# Patient Record
Sex: Female | Born: 1960 | Race: White | Hispanic: No | Marital: Married | Smoking: Current every day smoker
Health system: Southern US, Community
[De-identification: ages and names within clinical notes are randomized; demographics above are authoritative.]

## PROBLEM LIST (undated history)

## (undated) DIAGNOSIS — F329 Major depressive disorder, single episode, unspecified: Secondary | ICD-10-CM

## (undated) DIAGNOSIS — F32A Depression, unspecified: Secondary | ICD-10-CM

## (undated) DIAGNOSIS — G43909 Migraine, unspecified, not intractable, without status migrainosus: Secondary | ICD-10-CM

## (undated) DIAGNOSIS — D649 Anemia, unspecified: Secondary | ICD-10-CM

## (undated) DIAGNOSIS — C801 Malignant (primary) neoplasm, unspecified: Secondary | ICD-10-CM

## (undated) DIAGNOSIS — I219 Acute myocardial infarction, unspecified: Secondary | ICD-10-CM

## (undated) DIAGNOSIS — M8580 Other specified disorders of bone density and structure, unspecified site: Secondary | ICD-10-CM

## (undated) DIAGNOSIS — K219 Gastro-esophageal reflux disease without esophagitis: Secondary | ICD-10-CM

## (undated) DIAGNOSIS — T7840XA Allergy, unspecified, initial encounter: Secondary | ICD-10-CM

## (undated) DIAGNOSIS — M797 Fibromyalgia: Secondary | ICD-10-CM

## (undated) DIAGNOSIS — F419 Anxiety disorder, unspecified: Secondary | ICD-10-CM

## (undated) DIAGNOSIS — K5190 Ulcerative colitis, unspecified, without complications: Secondary | ICD-10-CM

## (undated) HISTORY — DX: Anxiety disorder, unspecified: F41.9

## (undated) HISTORY — PX: OTHER SURGICAL HISTORY: SHX169

## (undated) HISTORY — DX: Depression, unspecified: F32.A

## (undated) HISTORY — PX: LAPAROSCOPY: SHX197

## (undated) HISTORY — DX: Malignant (primary) neoplasm, unspecified: C80.1

## (undated) HISTORY — DX: Fibromyalgia: M79.7

## (undated) HISTORY — DX: Major depressive disorder, single episode, unspecified: F32.9

## (undated) HISTORY — DX: Gastro-esophageal reflux disease without esophagitis: K21.9

## (undated) HISTORY — DX: Allergy, unspecified, initial encounter: T78.40XA

## (undated) HISTORY — DX: Anemia, unspecified: D64.9

## (undated) HISTORY — PX: TUBAL LIGATION: SHX77

## (undated) HISTORY — DX: Other specified disorders of bone density and structure, unspecified site: M85.80

## (undated) HISTORY — DX: Migraine, unspecified, not intractable, without status migrainosus: G43.909

## (undated) HISTORY — PX: LEFT OOPHORECTOMY: SHX1961

---
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
## 2000-07-16 ENCOUNTER — Ambulatory Visit (HOSPITAL_COMMUNITY): Admission: RE | Admit: 2000-07-16 | Discharge: 2000-07-16 | Payer: Self-pay | Admitting: Internal Medicine

## 2000-12-23 ENCOUNTER — Other Ambulatory Visit: Admission: RE | Admit: 2000-12-23 | Discharge: 2000-12-23 | Payer: Self-pay | Admitting: Obstetrics and Gynecology

## 2000-12-29 ENCOUNTER — Encounter: Admission: RE | Admit: 2000-12-29 | Discharge: 2000-12-29 | Payer: Self-pay | Admitting: Obstetrics and Gynecology

## 2000-12-29 ENCOUNTER — Encounter: Payer: Self-pay | Admitting: Obstetrics and Gynecology

## 2001-02-18 ENCOUNTER — Encounter: Admission: RE | Admit: 2001-02-18 | Discharge: 2001-02-18 | Payer: Self-pay | Admitting: Obstetrics and Gynecology

## 2001-02-18 ENCOUNTER — Encounter: Payer: Self-pay | Admitting: Obstetrics and Gynecology

## 2001-04-23 ENCOUNTER — Emergency Department (HOSPITAL_COMMUNITY): Admission: EM | Admit: 2001-04-23 | Discharge: 2001-04-24 | Payer: Self-pay | Admitting: Emergency Medicine

## 2002-01-26 ENCOUNTER — Other Ambulatory Visit: Admission: RE | Admit: 2002-01-26 | Discharge: 2002-01-26 | Payer: Self-pay | Admitting: *Deleted

## 2003-01-31 ENCOUNTER — Other Ambulatory Visit: Admission: RE | Admit: 2003-01-31 | Discharge: 2003-01-31 | Payer: Self-pay | Admitting: *Deleted

## 2003-09-15 ENCOUNTER — Encounter: Admission: RE | Admit: 2003-09-15 | Discharge: 2003-09-15 | Payer: Self-pay | Admitting: *Deleted

## 2005-11-11 ENCOUNTER — Ambulatory Visit (HOSPITAL_BASED_OUTPATIENT_CLINIC_OR_DEPARTMENT_OTHER): Admission: RE | Admit: 2005-11-11 | Discharge: 2005-11-11 | Payer: Self-pay | Admitting: Orthopedic Surgery

## 2006-01-22 ENCOUNTER — Other Ambulatory Visit: Admission: RE | Admit: 2006-01-22 | Discharge: 2006-01-22 | Payer: Self-pay | Admitting: Obstetrics and Gynecology

## 2006-05-05 ENCOUNTER — Ambulatory Visit (HOSPITAL_BASED_OUTPATIENT_CLINIC_OR_DEPARTMENT_OTHER): Admission: RE | Admit: 2006-05-05 | Discharge: 2006-05-05 | Payer: Self-pay | Admitting: Obstetrics & Gynecology

## 2006-05-05 ENCOUNTER — Encounter (INDEPENDENT_AMBULATORY_CARE_PROVIDER_SITE_OTHER): Payer: Self-pay | Admitting: *Deleted

## 2007-03-19 ENCOUNTER — Encounter: Admission: RE | Admit: 2007-03-19 | Discharge: 2007-03-19 | Payer: Self-pay | Admitting: Internal Medicine

## 2007-06-24 ENCOUNTER — Other Ambulatory Visit: Admission: RE | Admit: 2007-06-24 | Discharge: 2007-06-24 | Payer: Self-pay | Admitting: Obstetrics & Gynecology

## 2008-03-30 ENCOUNTER — Encounter: Admission: RE | Admit: 2008-03-30 | Discharge: 2008-03-30 | Payer: Self-pay | Admitting: Obstetrics & Gynecology

## 2011-03-21 NOTE — Op Note (Signed)
Kathy Stephens, Kathy Stephens                ACCOUNT NO.:  [id number]   MEDICAL RECORD NO.:  [id number]          PATIENT TYPE:  AMB   LOCATION:  [facility]SC                          FACILITY:  [facility]   PHYSICIAN:  Katy Fitch. Sypher, M.D. DATE OF BIRTH:  18-Mar-1961   DATE OF PROCEDURE:  11/11/2005  DATE OF DISCHARGE:                                 OPERATIVE REPORT   PREOPERATIVE DIAGNOSES:  1.  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  2.  Interphalangeal pain, rule out degenerative arthritis and mucous cyst      formation.   POSTOPERATIVE DIAGNOSIS:  1  Right thumb stenosing tenosynovitis at A1  pulley.  1.  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  .   OPERATION:  1.  Release of right thumb A1 pulley,  2.  Injection of right thumb interphalangeal joint with Depo-Medrol 40 mg      per mL and lidocaine 1 cc plain lidocaine.   OPERATING SURGEON:  Katy Fitch. Sypher, M.D.   ASSISTANT:  Kathy Stephens P.A.-C .   ANESTHESIA:  Monitored anesthesia care with block of right thumb with 0.25%  Marcaine and 2% lidocaine at metacarpal head level supplemented by IV  sedation.   SUPERVISING ANESTHESIOLOGIST:  Janetta Hora. Gelene Mink, M.D.   INDICATIONS:  Kathy Stephens is a 50 year old woman self-referred for a  locking right thumb.  I have been acquainted with Kathy Stephens for  approximately 18 years as in the past we performed a replantation of her  son's long finger.   She presented for evaluation and management of a locking right thumb with  pain at the MP joint overlying the flexor sheath and triggering of her IP  joint.  In addition, she noted a painful bump on the dorsal radial aspect of  her IP joint consistent with an osteophyte at the base of the distal phalanx  and a kissing osteophyte on the adjacent surface of the proximal phalanx.   She thought she might of the cyst in this region.   Careful examination the holding area suggested  that this was simply an  osteophyte.   She requested that we either excise the osteophyte or otherwise intervene.  I suggested a steroid injection into the IP joint to try to relieve her  pain.   After informed consent, she is brought to the operating room at this time.   DESCRIPTION OF PROCEDURE:  Kathy Stephens is brought to the operating room and  placed in the supine position on the table.   Following light sedation, the right arm was prepped with Betadine soap and  solution and sterilely draped.   Following exsanguination of the right arm with an Esmarch bandage, an  arterial tourniquet on proximal brachium was inflated to 220 mmHg.   The procedure commenced with infiltration of 0.25% Marcaine and 2% lidocaine  into the path of the intended incision.  After a few moments. anesthesia was  satisfactory.   The procedure commenced with a short transverse incision directly over the  palpably thickened A1 pulley.  The subcutaneous tissues were carefully  divided revealing the flexor sheath.  The radial proper digital nerve was  gently retracted.   The A1 pulley was isolated and the sheath split with scalpel and scissors.  The tendon thereafter had a full excursion with recovery of full motion at  the IP joint.   The IP joint was then carefully palpated.  No mucous cyst was identified.   A mixture of 50% Depo-Medrol 40 mg/mL and 1% plain lidocaine was then  injected, a total volume of approximately 0.6 into the IP joint capsule from  a dorsal approach.   The wound for the release of the A1 pulley was repaired with mattress suture  of 5-0 nylon.  The IP joint injection was painted with Betadine.  There were  no apparent complications.   Kathy Stephens was placed in a compressive dressing with sterile gauze and Ace  wrap. She will return to our office for followup in one week at which time  we will advise the range of motion exercise program.      Katy Fitch. Sypher, M.D.   Electronically Signed     RVS/MEDQ  D:  11/11/2005  T:  11/12/2005  Job:  [id number]

## 2011-03-21 NOTE — Op Note (Signed)
NAME:  Kathy Stephens, Kathy Stephens                ACCOUNT NO.:  [id number]   MEDICAL RECORD NO.:  [id number]          PATIENT TYPE:  AMB   LOCATION:  [facility]                         FACILITY:  [facility]   PHYSICIAN:  M. Leda Quail, MD  DATE OF BIRTH:  12-21-1960   DATE OF PROCEDURE:  DATE OF DISCHARGE:                                 OPERATIVE REPORT   PREOPERATIVE DIAGNOSES:  61.  50 year old married white female with complex left ovarian cyst with      excrescence present.  2.  Chronic pelvic pain.  3.  History of cesarean section and bilateral tubal ligation.   POSTOPERATIVE DIAGNOSES:  31.  50 year old married white female with complex left ovarian cyst with      excrescence present.  2.  Chronic pelvic pain.  3.  History of cesarean section and bilateral tubal ligation.   PROCEDURE:  Laparoscopic left salpingo-oophorectomy.   SURGEON:  M. Leda Quail, M.D.   ASSISTANT:  Edwena Felty. Romine, M.D.   ANESTHESIA:  General endotracheal anesthesia.   SPECIMENS:  Left ovary and tube.   ESTIMATED BLOOD LOSS:  Minimal.   URINE OUTPUT:  30 cc of clear with concentrated urine in the Foley catheter.   FLUIDS:  1400 cc of LR.   COMPLICATIONS:  None.   INDICATIONS FOR PROCEDURE:  Kathy Stephens is a very pleasant 45 year old married  white female who has a history of chronic left lower quadrant pain.  This  has been evaluated with an ultrasound showing a complex left ovarian cyst,  and excrescence was present on the cyst.  Ultrasound in four weeks showed  poor resolution of the cyst.  We discussed the treatment options which would  include further conservative management or surgical excision which the  patient opted for.  She has been consented in the clinic as well as here in  the hospital this morning, and the risks and benefits have been discussed  with the patient.   DESCRIPTION OF PROCEDURE:  The patient was taken to the operating room with  running IV in place.  Informed consent was present  on the chart.  She was  placed in the supine position.  General endotracheal anesthesia was  administered by the anesthesia staff without difficulty.  The legs were then  positioned in the Cornell stirrups in the low lithotomy position.  The  umbilicus, abdomen, perineum, inner thighs, and vagina were prepped in the  normal sterile fashion.  A bivalve speculum was placed in the vagina, and  the anterior lip of the cervix was grasped with a single-tooth tenaculum.  An acorn uterine manipulator was inserted through the cervical os and  attached to the tenaculum as a means of manipulating the uterus during the  procedure.  A Foley catheter was then inserted under sterile conditions.  The abdomen and perineum were then prepped and both draped in the normal  sterile fashion.   Sterile gloves were changed, and attention was turned to the abdomen.  The  old incision at the inferior aspect of the umbilicus was identified.  Allis  clamps were applied to either  side of this incision.  Using a #11 blade, a  10-mm incision was made through the same incision.  A hemostat was used to  dissect some of the subcutaneous fat and tissue.  Then a short Veress needle  was obtained.  The patient's abdominal wall was elevated, and the Veress  needle was aimed towards the pelvis.  She did have some scar tissue through  her midline C-section incision.  There was some difficulty identifying when  the peritoneal layer was traversed.  At first, the operator thought the  peritoneum was traversed with the Veress needle, and sterile saline was  injected, and then without difficulty, an aspiration was performed.  No  blood or other fluid was present.  A drip test was then performed, and there  was not any drip from the Veress needle.  I did not think that the Veress  needle was intraperitoneally at this point; however, gas was attached at low  flow, and there were high pressures noted.  A second attempt at placing the   Veress needle was performed.  An attempt was made to place the Veress needle  through the exact same location as before because of the scar from the C-  section.  This time, the peritoneum could easily be identified as it was  popped through with the Veress needle.  The Veress needle, again, was aimed  towards the pelvis, and the abdominal wall area was lifted by the operator's  assistant.  At this point, aspiration was performed with the Veress needle,  and no fluid or blood was aspirated.  Fluid was injected without difficulty,  and the drip test was performed.  The fluid dripped into the Veress needle  without difficulty.  CO2 gas was attached to the Veress needle on low flow,  and there were low pressures under 5 mmHg.  Pneumoperitoneum was achieved  without difficulty.  Once 2.5 L of CO2 was in the abdomen, the Veress needle  was removed.  A long bladed trocar and port were then used for direct entry  placement.  The abdominal wall layer again was elevated, and the #11 trocar  and port were aimed towards the pelvis.  Once the peritoneum was traversed,  the blade was removed and the laparoscope was used to confirm  intraperitoneal placement.  CO2 gas was then attached to the port on high  flow to maintain pneumoperitoneum throughout the procedure.  At this point,  the uterus was elevated.  The right ovary could be visualized; however, the  left ovary could not.  Decision was made to go ahead and place the ports in  the right and left lower quadrant.  The abdomen was transilluminated, and  the abdominal wall vasculature was noted.  Placement for the inferior ports  was identified, and 5-mm skin incisions were made on either side.  Using  visualization from the laparoscope, the 5-mm bladed trocar and ports were  placed under direct entry in the right and left lower quadrant.  No bleeding was noted from the port sites after entry, and the blades were removed.  Patient was positioned in  Trendelenberg at this point.  A blunt probe was  then used, and the left ovary was flipped up.  There was a cyst that was  noted.  It did appear simple in nature; however, there were no adhesions or  abnormalities otherwise on the left ovary.  At this point, using a Nezhat  suction irrigator, approximately 500 cc of fluid was  instilled in the pelvis  and around the ovary.  The abdomen was agitated, and then this was aspirated  out.  This was sent off for cytologic evaluation.   A survey of the upper abdomen was performed at this point.  The appendix was  noted.  The liver edge appeared smooth.  The gallbladder could not be  visualized.  The stomach edge was visualized, and photo documentation of all  of this was made.  The bladder was visualized, and there was no scar tissue.   There were adhesions of the left colon down almost to the IP ligament on the  left side.  As the patient does have some issues with chronic pelvic pain,  the decision was made to go ahead and divide these adhesions and free the  colon more fully from the left side and the left IP ligament.  The bowel was  grasped with a smooth pickup, and then using an endoscopic scissors, the  adhesions were divided without difficulty.  At this point, good  visualization of the left IP ligament was noted.  The ureter was identified  on the left side.  This was deep in the pelvis and well below the level of  where the dissection would be performed.  At this point, a bipolar cautery  with attached blade made by [employer] was used.  This was placed in the  abdomen.  The IP ligament was grasped close to the ovary.  Cautery was then  performed.  Once the resistance was 0, full cauterization of that segment of  tissue was noted.  Full cauterization was noted via visual indicator, noting  0 resistance.  The IP ligament was cauterized twice in the same location  before the cut was performed.  Three separate cauterizations and then   incisions were made to fully traverse the IP ligament.  At this point, the  utero-ovarian ligament was divided first by cauterization and then incising  the pedicle.  Once the left ovary was freed from the IP ligament and the  utero-ovarian ligament, it was placed in the cul-de-sac.  There was a small  amount of bleeding that was still present on the utero-ovarian ligament next  to the cornu of the uterus.  This was grasped with a bipolar cautery, and  full cauterization of this bleeding pedicle was performed.  Excellent  hemostasis was noted at this time.  The suction irrigator was placed back  intra-abdominally.  The left side was irrigated, and no bleeding was noted.  Photo documentation was made.   At this point, a 5-mm scope was obtained, and this was placed in the right  lower quadrant port.  An endoscopic grasper was used to grasp the ovary and bring it out of the cul-de-sac.  An endoscopic bag was placed in the  midline.  The ovary was placed in the bag without difficulty.  The bag was  tightened down, and the ovary and bag were brought out through the midline  incision without difficulty.  The ovary was completely intact at this point.  This was handed off to be sent to pathology.   The midline trocar and port were placed back in the infraumbilical incision,  and then the trocar was removed and 10-mm laparoscope was placed back in the  infraumbilical incision.  The pelvis was resurveyed.  The Nezhat suction  irrigator was used again, and there was good cauterization of the left IP  ligament.  As well, there was good cauterization of  the utero-ovarian  ligament on the left.  The irrigant was fully sucked out of the pelvis.  At  this point, excellent hemostasis was noted.  The right left lower quadrant  ports were removed, and there was no bleeding noted.  The infraumbilical  port and laparoscope were then removed without difficulty.   The operator's index finger was placed in the  incision, and the bowel was  not in the incision.  Attempt was made to close the fascia with a 1-0 Vicryl  with figure-of-eight suture.  The fascia was very deep through the patient's  subcutaneous fat, so I am not completely sure that the fascia was closed  completely.  The incisions were then closed with subcuticular stitches of 4-  0 Vicryl Rapide.  The incisions were cleansed.  Benzoin and Steri-Strips  were applied, and 6 cc of 0.5% Marcaine were instilled beneath these three  incisions for local anesthesia.  At this point, the rest of the abdomen was  cleansed.  The instruments from the vagina were removed.  There was no  bleeding noted from the cervix.  The legs were positioned back in a supine  position.   The patient was awakened from anesthesia without difficulty.  The Foley  catheter was removed.  Sponge, lap, needle, and instrument counts were  correct x2.  The patient tolerated the procedure well.  She was taken to the  recovery room in stable condition.      Lum Keas, MD  Electronically Signed     MSM/MEDQ  D:  05/05/2006  T:  05/05/2006  Job:  [id number]

## 2011-05-13 ENCOUNTER — Other Ambulatory Visit: Payer: Self-pay | Admitting: Physician Assistant

## 2011-05-30 ENCOUNTER — Other Ambulatory Visit: Payer: Self-pay | Admitting: Obstetrics & Gynecology

## 2011-05-30 DIAGNOSIS — Z1231 Encounter for screening mammogram for malignant neoplasm of breast: Secondary | ICD-10-CM

## 2011-06-12 ENCOUNTER — Ambulatory Visit
Admission: RE | Admit: 2011-06-12 | Discharge: 2011-06-12 | Disposition: A | Discharge status: Home / Self Care | Payer: 59 | Source: Ambulatory Visit | Attending: Obstetrics & Gynecology | Admitting: Obstetrics & Gynecology

## 2011-06-12 DIAGNOSIS — Z1231 Encounter for screening mammogram for malignant neoplasm of breast: Secondary | ICD-10-CM

## 2011-08-28 ENCOUNTER — Ambulatory Visit (INDEPENDENT_AMBULATORY_CARE_PROVIDER_SITE_OTHER): Payer: Self-pay

## 2011-08-28 DIAGNOSIS — R197 Diarrhea, unspecified: Secondary | ICD-10-CM

## 2011-09-05 ENCOUNTER — Other Ambulatory Visit: Payer: 59 | Admitting: Internal Medicine

## 2011-09-05 ENCOUNTER — Encounter: Payer: Self-pay | Admitting: Internal Medicine

## 2011-09-05 ENCOUNTER — Ambulatory Visit (AMBULATORY_SURGERY_CENTER): Payer: Self-pay | Admitting: Internal Medicine

## 2011-09-05 VITALS — BP 143/98 | HR 70 | Temp 97.8°F | Resp 20 | Ht 63.0 in | Wt 205.0 lb

## 2011-09-05 DIAGNOSIS — Z1211 Encounter for screening for malignant neoplasm of colon: Secondary | ICD-10-CM

## 2011-09-05 DIAGNOSIS — Z006 Encounter for examination for normal comparison and control in clinical research program: Secondary | ICD-10-CM

## 2011-09-05 MED ORDER — SODIUM CHLORIDE 0.9 % IV SOLN: 500.0000 mL | INTRAVENOUS | Status: DC

## 2011-09-05 NOTE — Patient Instructions (Signed)
Normal colonoscopy  Repeat colonoscopy in 10 years  Keep your follow up appointment with research coordinator  Discharge instructions per blue and green sheets

## 2011-09-08 ENCOUNTER — Telehealth: Payer: Self-pay | Admitting: *Deleted

## 2011-09-08 NOTE — Telephone Encounter (Signed)
Follow up Call- Patient questions:  Do you have a fever, pain , or abdominal swelling? no Pain Score  0 *  Have you tolerated food without any problems? yes  Have you been able to return to your normal activities? yes  Do you have any questions about your discharge instructions: Diet   no Medications  no Follow up visit  no  Do you have questions or concerns about your Care? no  Actions: * If pain score is 4 or above: No action needed, pain <4. Pt states that she has mild pain in her stomach, but this is what she normally feels on a daily basis due to her IBS. Instructed pt to call back if pain worsens.

## 2011-09-17 ENCOUNTER — Ambulatory Visit: Payer: Self-pay

## 2011-11-05 ENCOUNTER — Encounter (HOSPITAL_COMMUNITY): Payer: Self-pay

## 2011-11-05 ENCOUNTER — Emergency Department (INDEPENDENT_AMBULATORY_CARE_PROVIDER_SITE_OTHER)
Admission: EM | Admit: 2011-11-05 | Discharge: 2011-11-05 | Disposition: A | Discharge status: Home / Self Care | Payer: 59 | Source: Home / Self Care

## 2011-11-05 DIAGNOSIS — M545 Low back pain, unspecified: Secondary | ICD-10-CM

## 2011-11-05 DIAGNOSIS — M533 Sacrococcygeal disorders, not elsewhere classified: Secondary | ICD-10-CM

## 2011-11-05 DIAGNOSIS — F32A Depression, unspecified: Secondary | ICD-10-CM

## 2011-11-05 DIAGNOSIS — G8929 Other chronic pain: Secondary | ICD-10-CM

## 2011-11-05 DIAGNOSIS — F419 Anxiety disorder, unspecified: Secondary | ICD-10-CM

## 2011-11-05 DIAGNOSIS — K219 Gastro-esophageal reflux disease without esophagitis: Secondary | ICD-10-CM

## 2011-11-05 HISTORY — DX: Gastro-esophageal reflux disease without esophagitis: K21.9

## 2011-11-05 HISTORY — DX: Anxiety disorder, unspecified: F41.9

## 2011-11-05 HISTORY — DX: Depression, unspecified: F32.A

## 2011-11-05 MED ORDER — DICLOFENAC SODIUM 75 MG PO TBEC: 75.0000 mg | DELAYED_RELEASE_TABLET | Freq: Two times a day (BID) | ORAL | Status: DC

## 2011-11-05 NOTE — ED Provider Notes (Signed)
History     CSN: [id number]  Arrival date & time 11/05/11  1406   None     Chief Complaint  Patient presents with  . Back Pain    (Consider location/radiation/quality/duration/timing/severity/associated sxs/prior treatment) HPI Comments: Pt states she has had chronic LBP x approx 10 yrs. She awoke with pain today though more in Rt buttock, no radiation, and pain is worse with prolonged sitting. Aleve provided no relief so she took a Tramadol and that has helped. No injury or aggrevating activity. She denies LE pain or parasthesias.    Past Medical History  Diagnosis Date  . Allergy   . Anxiety   . Depression   . GERD (gastroesophageal reflux disease)   . Neuromuscular disorder   . Fibromyalgia     Past Surgical History  Procedure Date  . Cesarean section   . Tendon relief   . Left oophorectomy     Family History  Problem Relation Age of Onset  . Breast cancer Mother   . Breast cancer Maternal Aunt   . Wilson's disease Maternal Grandfather   . Heart disease Paternal Grandmother   . Colon cancer Paternal Grandfather     History  Substance Use Topics  . Smoking status: Current Everyday Smoker -- 0.2 packs/day for 15 years    Types: Cigarettes  . Smokeless tobacco: Never Used  . Alcohol Use: 0.5 oz/week    1 drink(s) per week    OB History    Grav Para Term Preterm Abortions TAB SAB Ect Mult Living                  Review of Systems  Constitutional: Negative for fever and chills.  Respiratory: Negative for shortness of breath.   Cardiovascular: Negative for chest pain.  Gastrointestinal: Negative for abdominal pain.  Genitourinary: Negative for dysuria and frequency.  Musculoskeletal: Positive for back pain.  Skin: Negative for rash.  Neurological: Negative for weakness and numbness.    Allergies  Prednisone  Home Medications   Current Outpatient Rx  Name Route Sig Dispense Refill  . TRAMADOL HCL 50 MG PO TABS Oral Take 50 mg by mouth every 6  (six) hours as needed. Maximum dose= 8 tablets per day     . DICLOFENAC SODIUM 75 MG PO TBEC Oral Take 1 tablet (75 mg total) by mouth 2 (two) times daily. 20 tablet 0  . ESCITALOPRAM OXALATE 10 MG PO TABS      . VITAMIN D (ERGOCALCIFEROL) 50000 UNITS PO CAPS        BP 130/79  Pulse 66  Temp(Src) 98.1 F (36.7 C) (Oral)  Resp 20  SpO2 98%  Physical Exam  Nursing note and vitals reviewed. Constitutional: She appears well-developed and well-nourished. No distress.  Cardiovascular: Normal rate, regular rhythm and normal heart sounds.   Pulmonary/Chest: Effort normal and breath sounds normal. No respiratory distress.  Abdominal: Soft. Bowel sounds are normal. She exhibits no distension and no mass. There is no tenderness.  Musculoskeletal:       Right hip: Normal.       Thoracic back: Normal. She exhibits normal range of motion, no tenderness, no bony tenderness, no swelling, no deformity and no spasm.       Lumbar back: She exhibits tenderness. She exhibits normal range of motion, no swelling, no deformity and no spasm.       Back:       Pt able to stand heels and toes. Neg SLR bilat. Bilat LE  strength +5.   Neurological: She is alert. She has normal strength. No sensory deficit. Gait normal.  Reflex Scores:      Patellar reflexes are 3+ on the right side and 3+ on the left side. Skin: Skin is warm and dry. No rash noted.  Psychiatric: She has a normal mood and affect.    ED Course  Procedures (including critical care time)  Labs Reviewed - No data to display No results found.   1. Sacroiliac pain   2. Chronic low back pain       MDM  Chronic LBP with tender Rt SI joint on exam.         Melody Comas, PA 11/05/11 1751

## 2011-11-05 NOTE — ED Notes (Signed)
States has a hist of back pain, usually in mid back; past few days has been having pain in to her buttocks w no further radiation of pain ; denies trauma or any strain ; pain improved when stands or laying down; hist of fibromyalgia

## 2011-11-06 NOTE — ED Provider Notes (Signed)
Medical screening examination/treatment/procedure(s) were performed by non-physician practitioner and as supervising physician I was immediately available for consultation/collaboration.  LANEY,RONNIE   Ronnie Laney, MD 11/06/11 2248 

## 2012-03-18 ENCOUNTER — Encounter (HOSPITAL_COMMUNITY): Payer: Self-pay

## 2012-03-18 ENCOUNTER — Emergency Department (HOSPITAL_COMMUNITY): Payer: 59

## 2012-03-18 ENCOUNTER — Encounter (HOSPITAL_COMMUNITY): Payer: Self-pay | Admitting: Emergency Medicine

## 2012-03-18 ENCOUNTER — Emergency Department (INDEPENDENT_AMBULATORY_CARE_PROVIDER_SITE_OTHER)
Admission: EM | Admit: 2012-03-18 | Discharge: 2012-03-18 | Disposition: A | Discharge status: Nursing Home (Includes ICF) | Payer: 59 | Source: Home / Self Care | Attending: Emergency Medicine | Admitting: Emergency Medicine

## 2012-03-18 ENCOUNTER — Inpatient Hospital Stay (HOSPITAL_COMMUNITY)
Admission: EM | Admit: 2012-03-18 | Discharge: 2012-03-20 | DRG: 419 | Disposition: A | Discharge status: Home / Self Care | Payer: 59 | Attending: General Surgery | Admitting: General Surgery

## 2012-03-18 DIAGNOSIS — K8010 Calculus of gallbladder with chronic cholecystitis without obstruction: Secondary | ICD-10-CM

## 2012-03-18 DIAGNOSIS — K8020 Calculus of gallbladder without cholecystitis without obstruction: Secondary | ICD-10-CM

## 2012-03-18 DIAGNOSIS — IMO0001 Reserved for inherently not codable concepts without codable children: Secondary | ICD-10-CM | POA: Diagnosis present

## 2012-03-18 DIAGNOSIS — K219 Gastro-esophageal reflux disease without esophagitis: Secondary | ICD-10-CM | POA: Diagnosis present

## 2012-03-18 DIAGNOSIS — F17200 Nicotine dependence, unspecified, uncomplicated: Secondary | ICD-10-CM | POA: Diagnosis present

## 2012-03-18 DIAGNOSIS — F341 Dysthymic disorder: Secondary | ICD-10-CM | POA: Diagnosis present

## 2012-03-18 LAB — COMPREHENSIVE METABOLIC PANEL
ALT: 12 U/L (ref 0–35)
AST: 17 U/L (ref 0–37)
Albumin: 3.9 g/dL (ref 3.5–5.2)
Alkaline Phosphatase: 102 U/L (ref 39–117)
BUN: 7 mg/dL (ref 6–23)
CO2: 27 mEq/L (ref 19–32)
Calcium: 9.4 mg/dL (ref 8.4–10.5)
Chloride: 99 mEq/L (ref 96–112)
Creatinine, Ser: 0.77 mg/dL (ref 0.50–1.10)
GFR calc Af Amer: >90 mL/min (ref >90)
GFR calc non Af Amer: >90 mL/min (ref >90)
Glucose, Bld: 107 mg/dL — ABNORMAL HIGH (ref 70–99)
Potassium: 3.6 mEq/L (ref 3.5–5.1)
Sodium: 137 mEq/L (ref 135–145)
Total Bilirubin: 0.3 mg/dL (ref 0.3–1.2)
Total Protein: 7.2 g/dL (ref 6.0–8.3)

## 2012-03-18 LAB — DIFFERENTIAL
Basophils Absolute: 0 10*3/uL (ref 0.0–0.1)
Basophils Relative: 0 % (ref 0–1)
Eosinophils Absolute: 0.1 10*3/uL (ref 0.0–0.7)
Eosinophils Relative: 2 % (ref 0–5)
Lymphocytes Relative: 28 % (ref 12–46)
Lymphs Abs: 2.1 10*3/uL (ref 0.7–4.0)
Monocytes Absolute: 0.5 10*3/uL (ref 0.1–1.0)
Monocytes Relative: 7 % (ref 3–12)
Neutro Abs: 4.5 10*3/uL (ref 1.7–7.7)
Neutrophils Relative %: 62 % (ref 43–77)

## 2012-03-18 LAB — POCT URINALYSIS DIP (DEVICE)
Bilirubin Urine: NEGATIVE
Glucose, UA: NEGATIVE mg/dL
Hgb urine dipstick: NEGATIVE
Ketones, ur: NEGATIVE mg/dL
Leukocytes, UA: NEGATIVE
Nitrite: NEGATIVE
Protein, ur: NEGATIVE mg/dL
Specific Gravity, Urine: 1.015 (ref 1.005–1.030)
Urobilinogen, UA: 0.2 mg/dL (ref 0.0–1.0)
pH: 7 (ref 5.0–8.0)

## 2012-03-18 LAB — CBC
HCT: 41.1 % (ref 36.0–46.0)
Hemoglobin: 13.7 g/dL (ref 12.0–15.0)
MCH: 29.5 pg (ref 26.0–34.0)
MCHC: 33.3 g/dL (ref 30.0–36.0)
MCV: 88.6 fL (ref 78.0–100.0)
Platelets: 172 10*3/uL (ref 150–400)
RBC: 4.64 MIL/uL (ref 3.87–5.11)
RDW: 13.1 % (ref 11.5–15.5)
WBC: 7.3 10*3/uL (ref 4.0–10.5)

## 2012-03-18 LAB — LIPASE, BLOOD: Lipase: 83 U/L — ABNORMAL HIGH (ref 11–59)

## 2012-03-18 MED ORDER — HYDROMORPHONE HCL PF 1 MG/ML IJ SOLN
0.5000 mg | INTRAMUSCULAR | Status: DC | PRN
  Administered 2012-03-19 (×2): 0.5 mg via INTRAVENOUS
  Filled 2012-03-18: qty 1

## 2012-03-18 MED ORDER — CIPROFLOXACIN IN D5W 400 MG/200ML IV SOLN
400.0000 mg | Freq: Two times a day (BID) | INTRAVENOUS | Status: DC
  Administered 2012-03-18 – 2012-03-19 (×2): 400 mg via INTRAVENOUS
  Filled 2012-03-18 (×3): qty 200

## 2012-03-18 MED ORDER — PANTOPRAZOLE SODIUM 40 MG IV SOLR
40.0000 mg | Freq: Every day | INTRAVENOUS | Status: DC
  Administered 2012-03-19 (×2): 40 mg via INTRAVENOUS
  Filled 2012-03-18 (×3): qty 40

## 2012-03-18 MED ORDER — BIOTENE DRY MOUTH MT LIQD: 15.0000 mL | Freq: Two times a day (BID) | OROMUCOSAL | Status: DC

## 2012-03-18 MED ORDER — KCL IN DEXTROSE-NACL 20-5-0.45 MEQ/L-%-% IV SOLN
INTRAVENOUS | Status: DC
  Administered 2012-03-19: via INTRAVENOUS
  Filled 2012-03-18 (×5): qty 1000

## 2012-03-18 MED ORDER — HYDROMORPHONE HCL PF 1 MG/ML IJ SOLN
0.5000 mg | Freq: Once | INTRAMUSCULAR | Status: AC
  Administered 2012-03-18: 0.5 mg via INTRAVENOUS
  Filled 2012-03-18: qty 1

## 2012-03-18 MED ORDER — CHLORHEXIDINE GLUCONATE 0.12 % MT SOLN
15.0000 mL | Freq: Two times a day (BID) | OROMUCOSAL | Status: DC
  Filled 2012-03-18: qty 15

## 2012-03-18 MED ORDER — ONDANSETRON HCL 4 MG/2ML IJ SOLN
4.0000 mg | Freq: Four times a day (QID) | INTRAMUSCULAR | Status: DC | PRN
  Administered 2012-03-19: 4 mg via INTRAVENOUS
  Filled 2012-03-18: qty 2

## 2012-03-18 MED ORDER — ESCITALOPRAM OXALATE 10 MG PO TABS
10.0000 mg | ORAL_TABLET | Freq: Every morning | ORAL | Status: DC
  Administered 2012-03-20: 10 mg via ORAL
  Filled 2012-03-18 (×2): qty 1

## 2012-03-18 NOTE — ED Notes (Signed)
Pt complains of abd pain for five days and nause,a, sent from ucc for gallbladder work up.

## 2012-03-18 NOTE — Discharge Instructions (Signed)
We have determined that your problem requires further evaluation in the emergency department.  We will take care of your transport there.  Once at the emergency department, you will be evaluated by a provider and they will order whatever treatment or tests they deem necessary.  We cannot guarantee that they will do any specific test or do any specific treatment.  Biliary Colic  Biliary colic is a steady or irregular pain in the upper abdomen. It is usually under the right side of the rib cage. It happens when gallstones interfere with the normal flow of bile from the gallbladder. Bile is a liquid that helps to digest fats. Bile is made in the liver and stored in the gallbladder. When you eat a meal, bile passes from the gallbladder through the cystic duct and the common bile duct into the small intestine. There, it mixes with partially digested food. If a gallstone blocks either of these ducts, the normal flow of bile is blocked. The muscle cells in the bile duct contract forcefully to try to move the stone. This causes the pain of biliary colic.  SYMPTOMS   A person with biliary colic usually complains of pain in the upper abdomen. This pain can be:   In the center of the upper abdomen just below the breastbone.   In the upper-right part of the abdomen, near the gallbladder and liver.   Spread back toward the right shoulder blade.   Nausea and vomiting.   The pain usually occurs after eating.   Biliary colic is usually triggered by the digestive system's demand for bile. The demand for bile is high after fatty meals. Symptoms can also occur when a person who has been fasting suddenly eats a very large meal. Most episodes of biliary colic pass after 1 to 5 hours. After the most intense pain passes, your abdomen may continue to ache mildly for about 24 hours.  DIAGNOSIS  After you describe your symptoms, your caregiver will perform a physical exam. He or she will pay attention to the upper right  portion of your belly (abdomen). This is the area of your liver and gallbladder. An ultrasound will help your caregiver look for gallstones. Specialized scans of the gallbladder may also be done. Blood tests may be done, especially if you have fever or if your pain persists. PREVENTION  Biliary colic can be prevented by controlling the risk factors for gallstones. Some of these risk factors, such as heredity, increasing age, and pregnancy are a normal part of life. Obesity and a high-fat diet are risk factors you can change through a healthy lifestyle. Women going through menopause who take hormone replacement therapy (estrogen) are also more likely to develop biliary colic. TREATMENT   Pain medication may be prescribed.   You may be encouraged to eat a fat-free diet.   If the first episode of biliary colic is severe, or episodes of colic keep retuning, surgery to remove the gallbladder (cholecystectomy) is usually recommended. This procedure can be done through small incisions using an instrument called a laparoscope. The procedure often requires a brief stay in the hospital. Some people can leave the hospital the same day. It is the most widely used treatment in people troubled by painful gallstones. It is effective and safe, with no complications in more than 90% of cases.   If surgery cannot be done, medication that dissolves gallstones may be used. This medication is expensive and can take months or years to work. Only small stones  will dissolve.   Rarely, medication to dissolve gallstones is combined with a procedure called shock-wave lithotripsy. This procedure uses carefully aimed shock waves to break up gallstones. In many people treated with this procedure, gallstones form again within a few years.  PROGNOSIS  If gallstones block your cystic duct or common bile duct, you are at risk for repeated episodes of biliary colic. There is also a 25% chance that you will develop a gallbladder  infection(acute cholecystitis), or some other complication of gallstones within 10 to 20 years. If you have surgery, schedule it at a time that is convenient for you and at a time when you are not sick. HOME CARE INSTRUCTIONS   Drink plenty of clear fluids.   Avoid fatty, greasy or fried foods, or any foods that make your pain worse.   Take medications as directed.  SEEK MEDICAL CARE IF:   You develop a fever over 100.5 F (38.1 C).   Your pain gets worse over time.   You develop nausea that prevents you from eating and drinking.   You develop vomiting.  SEEK IMMEDIATE MEDICAL CARE IF:   You have continuous or severe belly (abdominal) pain which is not relieved with medications.   You develop nausea and vomiting which is not relieved with medications.   You have symptoms of biliary colic and you suddenly develop a fever and shaking chills. This may signal cholecystitis. Call your caregiver immediately.   You develop a yellow color to your skin or the white part of your eyes (jaundice).  Document Released: 03/23/2006 Document Revised: 10/09/2011 Document Reviewed: 06/01/2008 [facility] Patient Information 2012 [place], [place].Cholelithiasis Cholelithiasis (also called gallstones) is a form of gallbladder disease where gallstones form in your gallbladder. The gallbladder is a non-essential organ that stores bile made in the liver, which helps digest fats. Gallstones begin as small crystals and slowly grow into stones. Gallstone pain occurs when the gallbladder spasms, and a gallstone is blocking the duct. Pain can also occur when a stone passes out of the duct.  Women are more likely to develop gallstones than men. Other factors that increase the risk of gallbladder disease are:  Having multiple pregnancies. Physicians sometimes advise removing diseased gallbladders before future pregnancies.   Obesity.   Diets heavy in fried foods and fat.   Increasing age (older than 45).    Prolonged use of medications containing female hormones.   Diabetes mellitus.   Rapid weight loss.   Family history of gallstones (heredity).  SYMPTOMS  Feeling sick to your stomach (nauseous).   Abdominal pain.   Yellowing of the skin (jaundice).   Sudden pain. It may persist from several minutes to several hours.   Worsening pain with deep breathing or when jarred.   Fever.   Tenderness to the touch.  In some cases, when gallstones do not move into the bile duct, people have no pain or symptoms. These are called "silent" gallstones. TREATMENT In severe cases, emergency surgery may be required. HOME CARE INSTRUCTIONS   Only take over-the-counter or prescription medicines for pain, discomfort, or fever as directed by your caregiver.   Follow a low-fat diet until seen again. Fat causes the gallbladder to contract, which can result in pain.   Follow up as instructed. Attacks are almost always recurrent and surgery is usually required for permanent treatment.  SEEK IMMEDIATE MEDICAL CARE IF:   Your pain increases and is not controlled by medications.   You have an oral temperature above 102 F (  38.9 C), not controlled by medication.   You develop nausea and vomiting.  MAKE SURE YOU:   Understand these instructions.   Will watch your condition.   Will get help right away if you are not doing well or get worse.  Document Released: 10/16/2005 Document Revised: 10/09/2011 Document Reviewed: 12/19/2010 [facility] Patient Information 2012 [place], [place].

## 2012-03-18 NOTE — H&P (Signed)
Kathy Stephens is an 51 y.o. female.   Chief Complaint: Right upper quadrant abdominal pain HPI: Patient developed crampy right upper quadrant abdominal pain initially 5 days ago. It resolved spontaneously. It has returned with increasing frequency throughout the week. She had associated nausea but no vomiting. Pain earlier today was much worse. She was seen at urgent care. She was sent to [facility] emergency department with suspected gallbladder disease. Workup here included lab work and abdominal ultrasound. Ultrasound shows multiple gallstones without evidence of cholecystitis. Lab work was unrevealing with the exception of a mildly elevated lipase. She continued to have pain in the emergency department but initially refused pain medication. I was asked to see her and she is just received some Dilaudid. She continues to have pain.  Past Medical History  Diagnosis Date  . Allergy   . Anxiety   . Depression   . GERD (gastroesophageal reflux disease)   . Neuromuscular disorder   . Fibromyalgia     Past Surgical History  Procedure Date  . Cesarean section   . Tendon relief   . Left oophorectomy     Family History  Problem Relation Age of Onset  . Breast cancer Mother   . Breast cancer Maternal Aunt   . Wilson's disease Maternal Grandfather   . Heart disease Paternal Grandmother   . Colon cancer Paternal Grandfather    Social History:  reports that she has been smoking Cigarettes.  She has a 3.75 pack-year smoking history. She has never used smokeless tobacco. She reports that she drinks about .5 ounces of alcohol per week. She reports that she does not use illicit drugs. Patient works in Software engineer for the [employer] Allergies:  Allergies  Allergen Reactions  . Prednisone     Mouth swelled up     (Not in a hospital admission)  Results for orders placed during the hospital encounter of 03/18/12 (from the past 48 hour(s))  CBC     Status: Normal   Collection Time     03/18/12  5:59 PM      Component Value Range Comment   WBC 7.3  4.0 - 10.5 (K/uL)    RBC 4.64  3.87 - 5.11 (MIL/uL)    Hemoglobin 13.7  12.0 - 15.0 (g/dL)    HCT 16.1  09.6 - 04.5 (%)    MCV 88.6  78.0 - 100.0 (fL)    MCH 29.5  26.0 - 34.0 (pg)    MCHC 33.3  30.0 - 36.0 (g/dL)    RDW 40.9  81.1 - 91.4 (%)    Platelets 172  150 - 400 (K/uL)   DIFFERENTIAL     Status: Normal   Collection Time   03/18/12  5:59 PM      Component Value Range Comment   Neutrophils Relative 62  43 - 77 (%)    Neutro Abs 4.5  1.7 - 7.7 (K/uL)    Lymphocytes Relative 28  12 - 46 (%)    Lymphs Abs 2.1  0.7 - 4.0 (K/uL)    Monocytes Relative 7  3 - 12 (%)    Monocytes Absolute 0.5  0.1 - 1.0 (K/uL)    Eosinophils Relative 2  0 - 5 (%)    Eosinophils Absolute 0.1  0.0 - 0.7 (K/uL)    Basophils Relative 0  0 - 1 (%)    Basophils Absolute 0.0  0.0 - 0.1 (K/uL)   COMPREHENSIVE METABOLIC PANEL     Status: Abnormal  Collection Time   03/18/12  5:59 PM      Component Value Range Comment   Sodium 137  135 - 145 (mEq/L)    Potassium 3.6  3.5 - 5.1 (mEq/L)    Chloride 99  96 - 112 (mEq/L)    CO2 27  19 - 32 (mEq/L)    Glucose, Bld 107 (*) 70 - 99 (mg/dL)    BUN 7  6 - 23 (mg/dL)    Creatinine, Ser 8.29  0.50 - 1.10 (mg/dL)    Calcium 9.4  8.4 - 10.5 (mg/dL)    Total Protein 7.2  6.0 - 8.3 (g/dL)    Albumin 3.9  3.5 - 5.2 (g/dL)    AST 17  0 - 37 (U/L)    ALT 12  0 - 35 (U/L)    Alkaline Phosphatase 102  39 - 117 (U/L) REPEATED TO VERIFY   Total Bilirubin 0.3  0.3 - 1.2 (mg/dL)    GFR calc non Af Amer >90  >90 (mL/min)    GFR calc Af Amer >90  >90 (mL/min)   LIPASE, BLOOD     Status: Abnormal   Collection Time   03/18/12  5:59 PM      Component Value Range Comment   Lipase 83 (*) 11 - 59 (U/L)    US Abdomen Complete  03/18/2012  *RADIOLOGY REPORT*  Clinical Data:  51 year old female with right-sided abdominal pain.  ABDOMINAL ULTRASOUND COMPLETE  Comparison:  None  Findings:  Gallbladder:  Multiple  mobile gallstones are identified, the largest measuring 9 mm.  There is no evidence of gallbladder wall thickening, sonographic Murphy's sign or pericholecystic fluid.  Common Bile Duct:  There is no evidence of intrahepatic or extrahepatic biliary dilation. The CBD measures 4.2 mm in greatest diameter.  Liver:  The liver is within normal limits in parenchymal echogenicity. No focal abnormalities are identified.  IVC:  Appears normal.  Pancreas:  Although the pancreas is difficult to visualize in its entirety, no focal pancreatic abnormality is identified.  Spleen:  Within normal limits in size and echotexture.  Right kidney:  The right kidney is normal in size and parenchymal echogenicity.  There is no evidence of solid mass, hydronephrosis or definite renal calculi.  The right kidney measures 10.8 cm.  Left kidney:  The left kidney is normal in size and parenchymal echogenicity.  There is no evidence of solid mass, hydronephrosis or definite renal calculi.   The left kidney measures 10.3 cm.  Abdominal Aorta:  No abdominal aortic aneurysm identified.  There is no evidence of ascites.  IMPRESSION: Cholelithiasis without evidence of acute cholecystitis or biliary dilatation.  No other significant abnormalities identified.  Original Report Authenticated By: Rosendo Gros, M.D.    Review of Systems  Constitutional: Negative.   HENT: Negative.   Eyes: Negative.   Respiratory: Negative.   Cardiovascular: Negative.   Gastrointestinal: Positive for nausea, abdominal pain and diarrhea. Negative for heartburn, vomiting, constipation and blood in stool.  Genitourinary: Negative.   Musculoskeletal: Negative.   Skin:       Recent tattoo right wrist  Neurological: Negative.   Endo/Heme/Allergies: Negative.   Psychiatric/Behavioral: Positive for depression.    Blood pressure 143/74, pulse 60, temperature 98.2 F (36.8 C), temperature source Oral, resp. rate 18, SpO2 100.00%. Physical Exam  Constitutional:  She is oriented to person, place, and time. She appears well-developed and well-nourished. No distress.  HENT:  Head: Normocephalic and atraumatic.  Mouth/Throat: No oropharyngeal exudate.  Eyes: EOM are normal. Pupils are equal, round, and reactive to light. Left eye exhibits no discharge. No scleral icterus.  Neck: Normal range of motion. Neck supple. No tracheal deviation present. No thyromegaly present.  Cardiovascular: Normal rate, regular rhythm, normal heart sounds and intact distal pulses.   No murmur heard. Respiratory: Effort normal and breath sounds normal. No stridor. No respiratory distress. She has no wheezes. She has no rales.  GI: Soft. She exhibits no distension and no mass. There is tenderness. There is no rebound and no guarding.       Tenderness in the right upper quadrant and right costal margin out laterally without guarding  Musculoskeletal: Normal range of motion.       Recent tattoo right wrist  Neurological: She is alert and oriented to person, place, and time. Coordination normal.  Skin: Skin is warm and dry.     Assessment/Plan Symptomatic cholelithiasis with minimal elevation of lipase. Patient is still having pain and tenderness despite narcotic pain medication. There is no evidence of acute cholecystitis and I discussed the option of further pain medication and a trial of oral intake versus admission and cholecystectomy within the next 24-48 hours. Patient has been feeling quite ill today and does not feel comfortable going home. We will admit her, place her on IV antibiotics, and plan laparoscopic cholecystectomy with cholangiogram during this admission. Procedure, risks, and benefits were discussed in detail with the patient and her husband.  Aaryn Parrilla E 03/18/2012, 9:11 PM

## 2012-03-18 NOTE — ED Notes (Signed)
[id number] Ready

## 2012-03-18 NOTE — ED Provider Notes (Signed)
I saw and evaluated the patient, reviewed the resident's note and I agree with the findings and plan.   Jontae Adebayo, MD 03/18/12 2153 

## 2012-03-18 NOTE — ED Notes (Addendum)
PT HERE WITH 4 DYS OF INTERMIT RIGHT FLANK ACHY,SHARP PAIN RADIATING TO UPPER R ABD AND BACK WITH CHILLS,NAUSEA AND X 1 EPISODE OF DIARRHEA.NO VOMITING OR FEVERS REPORTED.ROLAIDS AND PEPTO BISMOL NOT WORKING

## 2012-03-18 NOTE — ED Provider Notes (Signed)
Chief Complaint  Patient presents with  . Abdominal Pain    History of Present Illness:   The patient is a 51 year old female who has had a five-day history of intermittent right upper quadrant pain which radiates up towards her lower sternum and to the right flank as well. This is worse in the evening and is crampy in nature, rated 5-6/10 in intensity. The symptoms come and go and seem to be getting worse. On one occasion the pains were brought on by eating a meal of vegetables. Better if she stands up. She tried Pepto-Bismol and Rolaids without relief. She had one diarrheal stool which was somewhat dark but this occurred after she took the Pepto-Bismol. She's felt somewhat bloated and had chills but no fever. She's been nauseated and not had much of an appetite. She is postmenopausal. She had a left ovary removed and also had a C-section and she denies any fever, vomiting, or blood in the stool. No urinary or GYN complaints.  Review of Systems:  Other than noted above, the patient denies any of the following symptoms: Constitutional:  No fever, chills, fatigue, weight loss or anorexia. Lungs:  No cough or shortness of breath. Heart:  No chest pain, palpitations, syncope or edema.  No cardiac history. Abdomen:  No nausea, vomiting, hematememesis, melena, diarrhea, or hematochezia. GU:  No dysuria, frequency, urgency, or hematuria. Gyn:  No vaginal discharge, itching, abnormal bleeding or pelvic pain. Skin:  No rash or itching.  PMFSH:  Past medical history, family history, social history, meds, and allergies were reviewed.  No prior abdominal surgeries, past history of GI problems, STDs or GYN problems.  No history of aspirin or NSAID use.  No excessive alcohol intake.  Physical Exam:   Vital signs:  BP 136/78  Pulse 70  Temp(Src) 98.3 F (36.8 C) (Oral)  Resp 18  SpO2 100% Gen:  Alert, oriented, in no distress. Lungs:  Breath sounds clear and equal bilaterally.  No wheezes, rales or  rhonchi. Heart:  Regular rhythm.  No gallops or murmers.   Abdomen:  Abdomen was soft, flat, nondistended. There was tenderness to palpation in the right upper quadrant and epigastrium. She has a positive Murphy's sign but negative Murphy's punch. No organomegaly or mass. Bowel sounds are normally active. No pulsatile midline abdominal mass or bruit. Skin:  Clear, warm and dry.  No rash.  Labs:   Results for orders placed during the hospital encounter of 03/18/12  POCT URINALYSIS DIP (DEVICE)      Component Value Range   Glucose, UA NEGATIVE  NEGATIVE (mg/dL)   Bilirubin Urine NEGATIVE  NEGATIVE    Ketones, ur NEGATIVE  NEGATIVE (mg/dL)   Specific Gravity, Urine 1.015  1.005 - 1.030    Hgb urine dipstick NEGATIVE  NEGATIVE    pH 7.0  5.0 - 8.0    Protein, ur NEGATIVE  NEGATIVE (mg/dL)   Urobilinogen, UA 0.2  0.0 - 1.0 (mg/dL)   Nitrite NEGATIVE  NEGATIVE    Leukocytes, UA NEGATIVE  NEGATIVE      Assessment:  The encounter diagnosis was Gallstones.  Plan:   1.  The following meds were prescribed:   New Prescriptions   No medications on file   2.  The patient was transferred to the emergency department via shuttle for further evaluation.  Reuben Likes, MD 03/18/12 [phone]

## 2012-03-18 NOTE — ED Notes (Signed)
Attempted to call report to RN on [phone].

## 2012-03-18 NOTE — ED Notes (Signed)
Attempted to call report to nurse on [phone]. 5100 Secretary reports that nurse will call back in 10 minutes

## 2012-03-18 NOTE — ED Provider Notes (Signed)
51 year old female comes in with right upper quadrant pain with associated nausea. Pain got worse after eating a vegetable plate but she does not remember if there is anything fatty on the plate. Exam is significant for right upper quadrant tenderness. Ultrasound does show cholelithiasis without cholecystitis. Laboratory workup is unremarkable. Surgical consultation is obtained and she will need an elective cholecystectomy.  Dione Booze, MD 03/18/12 2020

## 2012-03-18 NOTE — ED Provider Notes (Signed)
History     CSN: [id number]  Arrival date & time 03/18/12  1654   First MD Initiated Contact with Patient 03/18/12 1739      Chief Complaint  Patient presents with  . Abdominal Pain    (Consider location/radiation/quality/duration/timing/severity/associated sxs/prior treatment) HPI Comments: Several days of intermittent RUQ/epigastric pain worse after eating.  Associated nausea.  Otherwise doing well.  Patient is a 51 y.o. female presenting with abdominal pain. The history is provided by the patient.  Abdominal Pain The primary symptoms of the illness include abdominal pain (intermittent RUQ pain for last few days), nausea and diarrhea (several loose stools). The primary symptoms of the illness do not include fever, shortness of breath, vomiting, hematochezia, dysuria, vaginal discharge or vaginal bleeding. Episode onset: 5 days. The onset of the illness was sudden. The problem has been resolved.  The patient states that she believes she is currently not pregnant.    Past Medical History  Diagnosis Date  . Allergy   . Anxiety   . Depression   . GERD (gastroesophageal reflux disease)   . Neuromuscular disorder   . Fibromyalgia     Past Surgical History  Procedure Date  . Cesarean section   . Tendon relief   . Left oophorectomy     Family History  Problem Relation Age of Onset  . Breast cancer Mother   . Breast cancer Maternal Aunt   . Wilson's disease Maternal Grandfather   . Heart disease Paternal Grandmother   . Colon cancer Paternal Grandfather     History  Substance Use Topics  . Smoking status: Current Everyday Smoker -- 0.2 packs/day for 15 years    Types: Cigarettes  . Smokeless tobacco: Never Used  . Alcohol Use: 0.5 oz/week    1 drink(s) per week    OB History    Grav Para Term Preterm Abortions TAB SAB Ect Mult Living                  Review of Systems  Constitutional: Negative for fever and activity change.  HENT: Negative for congestion.     Eyes: Negative for visual disturbance.  Respiratory: Negative for chest tightness and shortness of breath.   Cardiovascular: Negative for chest pain and leg swelling.  Gastrointestinal: Positive for nausea, abdominal pain (intermittent RUQ pain for last few days) and diarrhea (several loose stools). Negative for vomiting and hematochezia.  Genitourinary: Negative for dysuria, vaginal bleeding and vaginal discharge.  Skin: Negative for rash.  Neurological: Negative for syncope.  Psychiatric/Behavioral: Negative for behavioral problems.    Allergies  Prednisone  Home Medications   Current Outpatient Rx  Name Route Sig Dispense Refill  . SLEEP AID PO Oral Take 1 tablet by mouth at bedtime.    Marland Kitchen ESCITALOPRAM OXALATE 10 MG PO TABS Oral Take 10 mg by mouth every morning.       BP 143/74  Pulse 60  Temp(Src) 98.2 F (36.8 C) (Oral)  Resp 18  SpO2 100%  Physical Exam  Constitutional: She is oriented to person, place, and time. She appears well-developed and well-nourished.  HENT:  Head: Normocephalic and atraumatic.  Eyes: Conjunctivae and EOM are normal. Pupils are equal, round, and reactive to light. No scleral icterus.  Neck: Normal range of motion. Neck supple.  Cardiovascular: Normal rate and regular rhythm.  Exam reveals no gallop and no friction rub.   No murmur heard. Pulmonary/Chest: Effort normal and breath sounds normal. No respiratory distress. She has no wheezes. She  has no rales. She exhibits no tenderness.  Abdominal: Soft. She exhibits no distension and no mass. There is tenderness (mild RUQ - negative murphys). There is no rebound and no guarding.  Musculoskeletal: Normal range of motion.  Neurological: She is alert and oriented to person, place, and time. She has normal reflexes. No cranial nerve deficit.  Skin: Skin is warm and dry. No rash noted.  Psychiatric: She has a normal mood and affect. Her behavior is normal. Judgment and thought content normal.    ED  Course  Procedures (including critical care time)  Labs Reviewed  COMPREHENSIVE METABOLIC PANEL - Abnormal; Notable for the following:    Glucose, Bld 107 (*)    All other components within normal limits  LIPASE, BLOOD - Abnormal; Notable for the following:    Lipase 83 (*)    All other components within normal limits  CBC  DIFFERENTIAL   US Abdomen Complete  03/18/2012  *RADIOLOGY REPORT*  Clinical Data:  51 year old female with right-sided abdominal pain.  ABDOMINAL ULTRASOUND COMPLETE  Comparison:  None  Findings:  Gallbladder:  Multiple mobile gallstones are identified, the largest measuring 9 mm.  There is no evidence of gallbladder wall thickening, sonographic Murphy's sign or pericholecystic fluid.  Common Bile Duct:  There is no evidence of intrahepatic or extrahepatic biliary dilation. The CBD measures 4.2 mm in greatest diameter.  Liver:  The liver is within normal limits in parenchymal echogenicity. No focal abnormalities are identified.  IVC:  Appears normal.  Pancreas:  Although the pancreas is difficult to visualize in its entirety, no focal pancreatic abnormality is identified.  Spleen:  Within normal limits in size and echotexture.  Right kidney:  The right kidney is normal in size and parenchymal echogenicity.  There is no evidence of solid mass, hydronephrosis or definite renal calculi.  The right kidney measures 10.8 cm.  Left kidney:  The left kidney is normal in size and parenchymal echogenicity.  There is no evidence of solid mass, hydronephrosis or definite renal calculi.   The left kidney measures 10.3 cm.  Abdominal Aorta:  No abdominal aortic aneurysm identified.  There is no evidence of ascites.  IMPRESSION: Cholelithiasis without evidence of acute cholecystitis or biliary dilatation.  No other significant abnormalities identified.  Original Report Authenticated By: Rosendo Gros, M.D.     1. Cholelithiases       MDM  Several days of intermittent RUQ/epigastric pain  worse after eating.  Associated nausea.  Otherwise doing well.  VSS.  TTP only in RUQ.  [place] confirms cholelithiasis.  Pt declines pain meds in ED but has persistent pain throughout ED stay.  [facility] consulted to eval for admission.        Army Chaco, MD 03/18/12 2044

## 2012-03-19 ENCOUNTER — Encounter (HOSPITAL_COMMUNITY): Admission: EM | Disposition: A | Discharge status: Home / Self Care | Payer: Self-pay | Source: Home / Self Care

## 2012-03-19 ENCOUNTER — Inpatient Hospital Stay (HOSPITAL_COMMUNITY): Payer: 59 | Admitting: Anesthesiology

## 2012-03-19 ENCOUNTER — Encounter (HOSPITAL_COMMUNITY): Payer: Self-pay | Admitting: Anesthesiology

## 2012-03-19 HISTORY — PX: CHOLECYSTECTOMY: SHX55

## 2012-03-19 LAB — COMPREHENSIVE METABOLIC PANEL
ALT: 11 U/L (ref 0–35)
AST: 14 U/L (ref 0–37)
Albumin: 3.2 g/dL — ABNORMAL LOW (ref 3.5–5.2)
Alkaline Phosphatase: 90 U/L (ref 39–117)
BUN: 7 mg/dL (ref 6–23)
CO2: 28 mEq/L (ref 19–32)
Calcium: 8.9 mg/dL (ref 8.4–10.5)
Chloride: 103 mEq/L (ref 96–112)
Creatinine, Ser: 0.76 mg/dL (ref 0.50–1.10)
GFR calc Af Amer: >90 mL/min (ref >90)
GFR calc non Af Amer: >90 mL/min (ref >90)
Glucose, Bld: 93 mg/dL (ref 70–99)
Potassium: 4.3 mEq/L (ref 3.5–5.1)
Sodium: 137 mEq/L (ref 135–145)
Total Bilirubin: 0.3 mg/dL (ref 0.3–1.2)
Total Protein: 6.3 g/dL (ref 6.0–8.3)

## 2012-03-19 LAB — CBC
HCT: 38.8 % (ref 36.0–46.0)
Hemoglobin: 12.5 g/dL (ref 12.0–15.0)
MCH: 29 pg (ref 26.0–34.0)
MCHC: 32.2 g/dL (ref 30.0–36.0)
MCV: 90 fL (ref 78.0–100.0)
Platelets: 145 10*3/uL — ABNORMAL LOW (ref 150–400)
RBC: 4.31 MIL/uL (ref 3.87–5.11)
RDW: 13.1 % (ref 11.5–15.5)
WBC: 5.3 10*3/uL (ref 4.0–10.5)

## 2012-03-19 LAB — AMYLASE: Amylase: 32 U/L (ref 0–105)

## 2012-03-19 LAB — SURGICAL PCR SCREEN
MRSA, PCR: NEGATIVE
Staphylococcus aureus: NEGATIVE

## 2012-03-19 LAB — LIPASE, BLOOD: Lipase: 16 U/L (ref 11–59)

## 2012-03-19 LAB — POCT PREGNANCY, URINE: Preg Test, Ur: NEGATIVE

## 2012-03-19 SURGERY — LAPAROSCOPIC CHOLECYSTECTOMY
Anesthesia: General | Site: Abdomen | Wound class: Clean Contaminated

## 2012-03-19 MED ORDER — SODIUM CHLORIDE 0.9 % IR SOLN
Status: DC | PRN
  Administered 2012-03-19: 1000 mL

## 2012-03-19 MED ORDER — ONDANSETRON HCL 4 MG/2ML IJ SOLN
INTRAMUSCULAR | Status: DC | PRN
  Administered 2012-03-19: 4 mg via INTRAVENOUS

## 2012-03-19 MED ORDER — GLYCOPYRROLATE 0.2 MG/ML IJ SOLN
INTRAMUSCULAR | Status: DC | PRN
  Administered 2012-03-19: .4 mg via INTRAVENOUS
  Administered 2012-03-19: .2 mg via INTRAVENOUS

## 2012-03-19 MED ORDER — PROPOFOL 10 MG/ML IV BOLUS
INTRAVENOUS | Status: DC | PRN
  Administered 2012-03-19: 150 mg via INTRAVENOUS

## 2012-03-19 MED ORDER — LACTATED RINGERS IV SOLN
INTRAVENOUS | Status: DC
  Administered 2012-03-19 (×2): via INTRAVENOUS

## 2012-03-19 MED ORDER — LIDOCAINE HCL (CARDIAC) 20 MG/ML IV SOLN
INTRAVENOUS | Status: DC | PRN
  Administered 2012-03-19: 80 mg via INTRAVENOUS

## 2012-03-19 MED ORDER — MIDAZOLAM HCL 5 MG/5ML IJ SOLN
INTRAMUSCULAR | Status: DC | PRN
  Administered 2012-03-19: 2 mg via INTRAVENOUS

## 2012-03-19 MED ORDER — MORPHINE SULFATE 4 MG/ML IJ SOLN: 0.0500 mg/kg | INTRAMUSCULAR | Status: DC | PRN

## 2012-03-19 MED ORDER — NEOSTIGMINE METHYLSULFATE 1 MG/ML IJ SOLN
INTRAMUSCULAR | Status: DC | PRN
  Administered 2012-03-19: 3 mg via INTRAVENOUS

## 2012-03-19 MED ORDER — 0.9 % SODIUM CHLORIDE (POUR BTL) OPTIME
TOPICAL | Status: DC | PRN
  Administered 2012-03-19: 1000 mL

## 2012-03-19 MED ORDER — VECURONIUM BROMIDE 10 MG IV SOLR
INTRAVENOUS | Status: DC | PRN
  Administered 2012-03-19: 6 mg via INTRAVENOUS

## 2012-03-19 MED ORDER — OXYCODONE-ACETAMINOPHEN 5-325 MG PO TABS
1.0000 | ORAL_TABLET | ORAL | Status: DC | PRN
  Administered 2012-03-20: 1 via ORAL
  Administered 2012-03-20: 2 via ORAL
  Filled 2012-03-19: qty 2
  Filled 2012-03-19: qty 1

## 2012-03-19 MED ORDER — HYDROMORPHONE HCL PF 1 MG/ML IJ SOLN
0.2500 mg | INTRAMUSCULAR | Status: DC | PRN
  Administered 2012-03-19: 0.5 mg via INTRAVENOUS

## 2012-03-19 MED ORDER — HYDROMORPHONE HCL PF 1 MG/ML IJ SOLN
0.5000 mg | INTRAMUSCULAR | Status: DC | PRN
  Administered 2012-03-19: 1 mg via INTRAVENOUS
  Filled 2012-03-19: qty 1

## 2012-03-19 MED ORDER — FENTANYL CITRATE 0.05 MG/ML IJ SOLN
INTRAMUSCULAR | Status: DC | PRN
  Administered 2012-03-19: 50 ug via INTRAVENOUS
  Administered 2012-03-19: 200 ug via INTRAVENOUS

## 2012-03-19 MED ORDER — KCL IN DEXTROSE-NACL 20-5-0.45 MEQ/L-%-% IV SOLN: INTRAVENOUS | Status: DC

## 2012-03-19 MED ORDER — ENOXAPARIN SODIUM 40 MG/0.4ML ~~LOC~~ SOLN
40.0000 mg | SUBCUTANEOUS | Status: DC
  Filled 2012-03-19: qty 0.4

## 2012-03-19 MED ORDER — BUPIVACAINE-EPINEPHRINE 0.25% -1:200000 IJ SOLN
INTRAMUSCULAR | Status: DC | PRN
  Administered 2012-03-19: 15 mL

## 2012-03-19 SURGICAL SUPPLY — 54 items
ADH SKN CLS APL DERMABOND .7 (GAUZE/BANDAGES/DRESSINGS) ×1
APPLIER CLIP 5 13 M/L LIGAMAX5 (MISCELLANEOUS) ×3
APPLIER CLIP ROT 10 11.4 M/L (STAPLE)
APR CLP MED LRG 11.4X10 (STAPLE)
APR CLP MED LRG 5 ANG JAW (MISCELLANEOUS) ×1
BAG SPEC RTRVL LRG 6X4 10 (ENDOMECHANICALS)
BLADE SURG ROTATE 9660 (MISCELLANEOUS) ×3 IMPLANT
CANISTER SUCTION 2500CC (MISCELLANEOUS) ×3 IMPLANT
CHLORAPREP W/TINT 26ML (MISCELLANEOUS) ×3 IMPLANT
CLIP APPLIE 5 13 M/L LIGAMAX5 (MISCELLANEOUS) ×2 IMPLANT
CLIP APPLIE ROT 10 11.4 M/L (STAPLE) IMPLANT
CLOTH BEACON ORANGE TIMEOUT ST (SAFETY) ×3 IMPLANT
CLSR STERI-STRIP ANTIMIC 1/2X4 (GAUZE/BANDAGES/DRESSINGS) ×3 IMPLANT
COVER MAYO STAND STRL (DRAPES) ×3 IMPLANT
COVER SURGICAL LIGHT HANDLE (MISCELLANEOUS) ×3 IMPLANT
DECANTER SPIKE VIAL GLASS SM (MISCELLANEOUS) ×6 IMPLANT
DERMABOND ADVANCED (GAUZE/BANDAGES/DRESSINGS) ×1
DERMABOND ADVANCED .7 DNX12 (GAUZE/BANDAGES/DRESSINGS) ×2 IMPLANT
DRAPE C-ARM 42X72 X-RAY (DRAPES) ×3 IMPLANT
DRAPE UTILITY 15X26 W/TAPE STR (DRAPE) ×6 IMPLANT
ELECT REM PT RETURN 9FT ADLT (ELECTROSURGICAL) ×3
ELECTRODE REM PT RTRN 9FT ADLT (ELECTROSURGICAL) ×2 IMPLANT
GLOVE BIO SURGEON STRL SZ7 (GLOVE) ×3 IMPLANT
GLOVE BIO SURGEON STRL SZ7.5 (GLOVE) ×3 IMPLANT
GLOVE BIOGEL PI IND STRL 7.0 (GLOVE) ×2 IMPLANT
GLOVE BIOGEL PI IND STRL 7.5 (GLOVE) ×2 IMPLANT
GLOVE BIOGEL PI IND STRL 8 (GLOVE) ×2 IMPLANT
GLOVE BIOGEL PI IND STRL 8.5 (GLOVE) ×2 IMPLANT
GLOVE BIOGEL PI INDICATOR 7.0 (GLOVE) ×1
GLOVE BIOGEL PI INDICATOR 7.5 (GLOVE) ×1
GLOVE BIOGEL PI INDICATOR 8 (GLOVE) ×1
GLOVE BIOGEL PI INDICATOR 8.5 (GLOVE) ×1
GLOVE ECLIPSE 7.5 STRL STRAW (GLOVE) ×3 IMPLANT
GLOVE EXAM NITRILE MICROT MD (GLOVE) ×3 IMPLANT
GOWN STRL NON-REIN LRG LVL3 (GOWN DISPOSABLE) ×9 IMPLANT
GOWN STRL REIN 2XL LVL4 (GOWN DISPOSABLE) ×3 IMPLANT
KIT BASIN OR (CUSTOM PROCEDURE TRAY) ×3 IMPLANT
KIT ROOM TURNOVER OR (KITS) ×3 IMPLANT
NS IRRIG 1000ML POUR BTL (IV SOLUTION) ×3 IMPLANT
PAD ARMBOARD 7.5X6 YLW CONV (MISCELLANEOUS) ×6 IMPLANT
POUCH SPECIMEN RETRIEVAL 10MM (ENDOMECHANICALS) IMPLANT
SCISSORS LAP 5X35 DISP (ENDOMECHANICALS) IMPLANT
SET CHOLANGIOGRAPH 5 50 .035 (SET/KITS/TRAYS/PACK) ×3 IMPLANT
SET IRRIG TUBING LAPAROSCOPIC (IRRIGATION / IRRIGATOR) ×3 IMPLANT
SLEEVE ENDOPATH XCEL 5M (ENDOMECHANICALS) ×6 IMPLANT
SPECIMEN JAR SMALL (MISCELLANEOUS) ×3 IMPLANT
SUT MNCRL AB 4-0 PS2 18 (SUTURE) ×3 IMPLANT
TOWEL OR 17X24 6PK STRL BLUE (TOWEL DISPOSABLE) ×3 IMPLANT
TOWEL OR 17X26 10 PK STRL BLUE (TOWEL DISPOSABLE) ×3 IMPLANT
TRAY LAPAROSCOPIC (CUSTOM PROCEDURE TRAY) ×3 IMPLANT
TROCAR XCEL BLUNT TIP 100MML (ENDOMECHANICALS) ×3 IMPLANT
TROCAR XCEL NON-BLD 11X100MML (ENDOMECHANICALS) IMPLANT
TROCAR XCEL NON-BLD 5MMX100MML (ENDOMECHANICALS) ×3 IMPLANT
WATER STERILE IRR 1000ML POUR (IV SOLUTION) IMPLANT

## 2012-03-19 NOTE — Anesthesia Procedure Notes (Signed)
Procedure Name: Intubation Date/Time: 03/19/2012 12:40 PM Performed by: Marena Chancy Pre-anesthesia Checklist: Patient identified, Emergency Drugs available, Suction available and Patient being monitored Patient Re-evaluated:Patient Re-evaluated prior to inductionOxygen Delivery Method: Circle system utilized Preoxygenation: Pre-oxygenation with 100% oxygen Intubation Type: IV induction Ventilation: Oral airway inserted - appropriate to patient size and Two handed mask ventilation required Laryngoscope Size: Miller and 2 Grade View: Grade II Tube type: Oral Tube size: 7.5 mm Number of attempts: 1 Placement Confirmation: ETT inserted through vocal cords under direct vision,  breath sounds checked- equal and bilateral and positive ETCO2 Secured at: 22 cm Tube secured with: Tape Dental Injury: Teeth and Oropharynx as per pre-operative assessment

## 2012-03-19 NOTE — Progress Notes (Signed)
Likely biliary colic.  For Lap Chole today.  Possible IOC.  Kathy Stephens. Gae Bon, MD, FACS [phone] [facility]

## 2012-03-19 NOTE — Transfer of Care (Signed)
Immediate Anesthesia Transfer of Care Note  Patient: Kathy Stephens  Procedure(s) Performed: Procedure(s) (LRB): LAPAROSCOPIC CHOLECYSTECTOMY (N/A)  Patient Location: PACU  Anesthesia Type: General  Level of Consciousness: awake, alert  and oriented  Airway & Oxygen Therapy: Patient Spontanous Breathing and Patient connected to nasal cannula oxygen  Post-op Assessment: Report given to PACU RN and Post -op Vital signs reviewed and stable  Post vital signs: Reviewed and stable  Complications: No apparent anesthesia complications

## 2012-03-19 NOTE — Preoperative (Signed)
Beta Blockers   Reason not to administer Beta Blockers:Not Applicable 

## 2012-03-19 NOTE — Op Note (Signed)
OPERATIVE REPORT  DATE OF OPERATION: 03/18/2012 - 03/19/2012  PATIENT:  Kathy Stephens  51 y.o. female  PRE-OPERATIVE DIAGNOSIS:  Biliary colic, cholelithiasis  POST-OPERATIVE DIAGNOSIS:  Same  PROCEDURE:  Procedure(s): LAPAROSCOPIC CHOLECYSTECTOMY  SURGEON:  Surgeon(s): Cherylynn Ridges, MD  ASSISTANT: None  ANESTHESIA:   general  EBL: <10 ml  BLOOD ADMINISTERED: none  DRAINS: none   SPECIMEN:  Source of Specimen:  gallbladder and stones  COUNTS CORRECT:  YES  PROCEDURE DETAILS: The patient was taken to the operating room and placed on the table in the supine position.  After an adequate endotracheal anesthetic was administered, (she/he) was prepped with (ChloroPrep/Betadine), and then draped in the usual manner exposing the entire abdomen laterally, inferiorly and up  to the costal margins.  After a proper timeout was performed including identifying the patient and the procedure to be performed, a supra-umbilical 1.5cm midline incision was made using a #15 blade.  This was taken down to the fascia which was then incised with a #15blade.  The edges of the fascia were tented up with Kocher clamps as the preperitoneal space was penetrated with a Kelly clamp into the peritoneum.  Once this was done, a pursestring suture of 0 Vicryl was passed around the fascial opening.  This was subsequently used to secure the [facility] cannula which was passed into the peritoneal cavity.  Once the [facility] cannula was in place, carbon dioxide gas was insufflated into the peritoneal cavity up to a maximal intra-abdominal pressure of 15mm Hg.The laparoscope, with attached camera and light source, was passed into the peritoneal cavity to visualize the direct insertion of two right upper quadrant 5mm cannulas, and a sup-xiphoid 5mm cannula.  Once all cannulas were in place, the dissection was begun.  Two ratcheted graspers were attached to the dome and infundibulum of the gallbladder and retracted towards the  anterior abdominal wall and the right upper quadrant.  Using cautery attached to a dissecting forceps, the peritoneum overlaying the triangle of Chalot and the hepatoduodenal triangle was dissected away exposing the cystic duct and the cystic artery.  The cystic duct was very small and sclerotic, therefore a cholangiogram was not attempted.  A clip was placed on the gallbladder side of the cystic duct, then the distal cystic duct was clipped x 3 and transected  The gallbladder was then dissected out of the hepatic bed without event.  It was retrieved from the abdomen using a large grasper without event.  Once the gallbladder was removed, the bed was inspected for hemostasis.  Once excellent hemostasis was obtained all gas and fluids were aspirated from above the liver, then the cannulas were removed.  The supra-umbilical incision was closed using the pursestring suture which was in place.  0.25% bupivicaine with epinephrine was injected at all sites.  All 10mm or greater cannula sites were close using a running subcuticular stitch of 4-0 Monocryl.  5.90mm cannula sites were closed with Dermabond only.Steri-Strips and Tagaderm were used to complete the dressings at all sites.  At this point all needle, sponge, and instrument counts were correct.The patient was awakened from anesthesia and taken to the PACU in stable condition.   PATIENT DISPOSITION:  PACU - hemodynamically stable.   Trystin Hargrove III,[place] [facility] 5/17/20131:37 PM

## 2012-03-19 NOTE — Anesthesia Postprocedure Evaluation (Signed)
  Anesthesia Post-op Note  Patient: Kathy Stephens  Procedure(s) Performed: Procedure(s) (LRB): LAPAROSCOPIC CHOLECYSTECTOMY (N/A)  Patient Location: PACU  Anesthesia Type: General  Level of Consciousness: awake  Airway and Oxygen Therapy: Patient Spontanous Breathing  Post-op Pain: mild  Post-op Assessment: Post-op Vital signs reviewed  Post-op Vital Signs: Reviewed  Complications: No apparent anesthesia complications

## 2012-03-19 NOTE — Anesthesia Preprocedure Evaluation (Addendum)
Anesthesia Evaluation  Patient identified by MRN, date of birth, ID band Patient awake    Reviewed: Allergy & Precautions, H&P , NPO status , Patient's Chart, lab work & pertinent test results  Airway Mallampati: II      Dental  (+) Teeth Intact   Pulmonary former smoker breath sounds clear to auscultation        Cardiovascular negative cardio ROS  Rhythm:Regular Rate:Normal     Neuro/Psych    GI/Hepatic negative GI ROS, Neg liver ROS, GERD-  ,Patient received Oral Contrast Agents,  Endo/Other  negative endocrine ROS  Renal/GU negative Renal ROS     Musculoskeletal  (+) Fibromyalgia -  Abdominal   Peds  Hematology negative hematology ROS (+)   Anesthesia Other Findings   Reproductive/Obstetrics                         Anesthesia Physical Anesthesia Plan  ASA: II  Anesthesia Plan: General   Post-op Pain Management:    Induction: Intravenous  Airway Management Planned: Oral ETT  Additional Equipment:   Intra-op Plan:   Post-operative Plan: Extubation in OR  Informed Consent:   Dental advisory given  Plan Discussed with: CRNA and Anesthesiologist  Anesthesia Plan Comments:         Anesthesia Quick Evaluation

## 2012-03-20 DIAGNOSIS — K8010 Calculus of gallbladder with chronic cholecystitis without obstruction: Secondary | ICD-10-CM | POA: Diagnosis present

## 2012-03-20 MED ORDER — PANTOPRAZOLE SODIUM 40 MG PO TBEC: 40.0000 mg | DELAYED_RELEASE_TABLET | Freq: Every day | ORAL | Status: DC

## 2012-03-20 MED ORDER — HYDROCODONE-ACETAMINOPHEN 5-325 MG PO TABS: 1.0000 | ORAL_TABLET | ORAL | Status: AC | PRN

## 2012-03-20 NOTE — Progress Notes (Signed)
1 Day Post-Op  Subjective: Feels okay today and able to be discharged. She is tolerated diet. She is having minimal incisional pain. She is ambulated. She has no nausea.  Objective: Vital signs in last 24 hours: Temp:  [97 F (36.1 C)-98.1 F (36.7 C)] 97.1 F (36.2 C) (05/18 0515) Pulse Rate:  [53-71] 60  (05/18 0515) Resp:  [16-28] 18  (05/18 0515) BP: (124-149)/(69-79) 124/69 mmHg (05/18 0515) SpO2:  [80 %-100 %] 93 % (05/18 0515)   Intake/Output from previous day: 05/17 0701 - 05/18 0700 In: 2720 [P.O.:280; I.V.:2440] Out: 550 [Urine:550] Intake/Output this shift:     Resp: clear to auscultation bilaterally GI: soft, non-tender; bowel sounds normal; no masses,  no organomegaly  Incision: healing well  Lab Results:   Basename 03/19/12 0632 03/18/12 1759  WBC 5.3 7.3  HGB 12.5 13.7  HCT 38.8 41.1  PLT 145* 172   BMET  Basename 03/19/12 0632 03/18/12 1759  NA 137 137  K 4.3 3.6  CL 103 99  CO2 28 27  GLUCOSE 93 107*  BUN 7 7  CREATININE 0.76 0.77  CALCIUM 8.9 9.4   PT/INR No results found for this basename: LABPROT:2,INR:2 in the last 72 hours ABG No results found for this basename: PHART:2,PCO2:2,PO2:2,HCO3:2 in the last 72 hours  MEDS, Scheduled    . enoxaparin  40 mg Subcutaneous Q24H  . escitalopram  10 mg Oral q morning - 10a  . pantoprazole (PROTONIX) IV  40 mg Intravenous QHS  . DISCONTD: antiseptic oral rinse  15 mL Mouth Rinse q12n4p  . DISCONTD: chlorhexidine  15 mL Mouth Rinse BID  . DISCONTD: ciprofloxacin  400 mg Intravenous Q12H    Studies/Results: US Abdomen Complete  03/18/2012  *RADIOLOGY REPORT*  Clinical Data:  51 year old female with right-sided abdominal pain.  ABDOMINAL ULTRASOUND COMPLETE  Comparison:  None  Findings:  Gallbladder:  Multiple mobile gallstones are identified, the largest measuring 9 mm.  There is no evidence of gallbladder wall thickening, sonographic Murphy's sign or pericholecystic fluid.  Common Bile Duct:   There is no evidence of intrahepatic or extrahepatic biliary dilation. The CBD measures 4.2 mm in greatest diameter.  Liver:  The liver is within normal limits in parenchymal echogenicity. No focal abnormalities are identified.  IVC:  Appears normal.  Pancreas:  Although the pancreas is difficult to visualize in its entirety, no focal pancreatic abnormality is identified.  Spleen:  Within normal limits in size and echotexture.  Right kidney:  The right kidney is normal in size and parenchymal echogenicity.  There is no evidence of solid mass, hydronephrosis or definite renal calculi.  The right kidney measures 10.8 cm.  Left kidney:  The left kidney is normal in size and parenchymal echogenicity.  There is no evidence of solid mass, hydronephrosis or definite renal calculi.   The left kidney measures 10.3 cm.  Abdominal Aorta:  No abdominal aortic aneurysm identified.  There is no evidence of ascites.  IMPRESSION: Cholelithiasis without evidence of acute cholecystitis or biliary dilatation.  No other significant abnormalities identified.  Original Report Authenticated By: Rosendo Gros, M.D.    Assessment: s/p Procedure(s): LAPAROSCOPIC CHOLECYSTECTOMY Well postoperatively and able to be discharged  Plan: Discharge   LOS: 2 days     Currie Paris, MD, [facility], [place] [phone]   03/20/2012 9:56 AM

## 2012-03-20 NOTE — Progress Notes (Signed)
Iv discontinued. Dc instructions gone over. Home medications gone over. Pain script given. Signs and symptoms of when to call the doctor gone over. Diet, activity, and incisional care discussed. Patient verbalized understanding of all instructions.

## 2012-03-22 ENCOUNTER — Encounter (HOSPITAL_COMMUNITY): Payer: Self-pay | Admitting: General Surgery

## 2012-03-26 NOTE — Discharge Summary (Signed)
Patient ID: Kathy Stephens MRN: [id number] DOB/AGE: 11-27-1960 51 y.o.  Admit date: 03/18/2012 Discharge date: 03/20/2012  Procedures: lap chole  Consults: None  Reason for Admission: this is a 51 yo female who presented to [facility] and was found to have biliary colic.  Admission Diagnoses:  1. Biliary colic  Hospital Course: The patient was admitted and taken to the operating room where she underwent a lap chole.  She tolerated the procedure well and was stable on POD# 1 for discharge home.  She was tolerating a regular diet and oral pain medication.  Discharge Diagnoses:  Principal Problem:  *Cholecystitis with cholelithiasis s/p lap chole  Discharge Medications: Medication List  As of 03/26/2012  3:39 PM   TAKE these medications         escitalopram 10 MG tablet   Commonly known as: LEXAPRO   Take 10 mg by mouth every morning.      HYDROcodone-acetaminophen 5-325 MG per tablet   Commonly known as: NORCO   Take 1 tablet by mouth every 4 (four) hours as needed for pain.      SLEEP AID PO   Take 1 tablet by mouth at bedtime.            Discharge Instructions: Follow-up Information    Follow up with WYATT Rene Kocher, MD. Schedule an appointment as soon as possible for a visit in 2 weeks.   Contact information:   [facility] [address] [place] [address] [phone]          Signed: Letha Cape 03/26/2012, 3:39 PM

## 2012-03-31 NOTE — Discharge Summary (Signed)
Ezra Marquess O. Jacquelinne Speak, III, MD, FACS [phone]--pager [phone]--office [facility]  

## 2012-04-14 ENCOUNTER — Encounter (INDEPENDENT_AMBULATORY_CARE_PROVIDER_SITE_OTHER): Payer: Self-pay | Admitting: General Surgery

## 2012-04-14 ENCOUNTER — Ambulatory Visit (INDEPENDENT_AMBULATORY_CARE_PROVIDER_SITE_OTHER): Payer: 59 | Admitting: General Surgery

## 2012-04-14 VITALS — BP 132/88 | HR 76 | Temp 97.8°F | Resp 16 | Ht 63.0 in | Wt 198.0 lb

## 2012-04-14 DIAGNOSIS — Z09 Encounter for follow-up examination after completed treatment for conditions other than malignant neoplasm: Secondary | ICD-10-CM | POA: Insufficient documentation

## 2012-04-14 NOTE — Progress Notes (Signed)
HPI The patient is status post laparoscopic cholecystectomy and doing very well. Many of her symptoms including the reflux symptoms have resolved. She is no longer having any bowel urgency or diarrhea  PE On examination her wounds have healed well with no evidence of infection. She has no evidence of hernia.  Studiy review None  Assessment Doing well status post laparoscopic cholecystectomy.  Plan Return to see me on a p.r.n. basis.  The patient can return to work full activity tomorrow.

## 2012-06-11 ENCOUNTER — Ambulatory Visit: Payer: 59

## 2012-06-11 ENCOUNTER — Ambulatory Visit (INDEPENDENT_AMBULATORY_CARE_PROVIDER_SITE_OTHER): Payer: 59 | Admitting: Emergency Medicine

## 2012-06-11 VITALS — BP 146/85 | HR 65 | Temp 98.0°F | Resp 16 | Ht 67.0 in | Wt 198.0 lb

## 2012-06-11 DIAGNOSIS — M25539 Pain in unspecified wrist: Secondary | ICD-10-CM

## 2012-06-11 DIAGNOSIS — M79646 Pain in unspecified finger(s): Secondary | ICD-10-CM

## 2012-06-11 DIAGNOSIS — M25519 Pain in unspecified shoulder: Secondary | ICD-10-CM

## 2012-06-11 DIAGNOSIS — M79609 Pain in unspecified limb: Secondary | ICD-10-CM

## 2012-06-11 MED ORDER — NAPROXEN SODIUM 550 MG PO TABS: 550.0000 mg | ORAL_TABLET | Freq: Two times a day (BID) | ORAL | Status: AC

## 2012-06-11 NOTE — Progress Notes (Signed)
Date:  06/11/2012   Name:  Kathy Stephens   DOB:  19-Oct-1961   MRN:  [id number] Gender: female  Age: 51 y.o.  PCP:  Tonye Pearson, MD    Chief Complaint: Wrist Injury and Shoulder Injury   History of Present Illness:  Kathy Stephens is a 51 y.o. pleasant patient who presents with the following:  Fell twice.  Once in June attempting to step over a fence and injured her left thumb and wrist.  Has persistent pain in wrist and base of thumb with a sensation of an electric shock when she presses down with her thumb.  Has a "knot' in her dorsal wrist. She just fell injuring her shoulder on a stairway where she attempted to keep from falling by grabbing the rail and pulling on her arm longitudinally.  Now has pain in the trapezius and globally in her shoulder.  Patient Active Problem List  Diagnosis  . Cholecystitis with cholelithiasis  . Postop check    Past Medical History  Diagnosis Date  . Allergy   . Anxiety   . Depression   . GERD (gastroesophageal reflux disease)   . Fibromyalgia     Past Surgical History  Procedure Date  . Cesarean section   . Tendon relief   . Left oophorectomy   . Foot surgery 1980's  . Cholecystectomy 03/19/2012    Procedure: LAPAROSCOPIC CHOLECYSTECTOMY;  Surgeon: Cherylynn Ridges, MD;  Location: [facility] OR;  Service: General;  Laterality: N/A;    History  Substance Use Topics  . Smoking status: Current Everyday Smoker -- 0.2 packs/day for 15 years    Types: Cigarettes  . Smokeless tobacco: Never Used  . Alcohol Use: 0.0 oz/week     socially    Family History  Problem Relation Age of Onset  . Breast cancer Mother   . Cancer Mother     breast  . Breast cancer Maternal Aunt   . Wilson's disease Maternal Grandfather   . Heart disease Paternal Grandmother   . Colon cancer Paternal Grandfather   . Cancer Father     bladder    Allergies  Allergen Reactions  . Prednisone     Mouth swelled up    Medication list has been reviewed and  updated.  Current Outpatient Prescriptions on File Prior to Visit  Medication Sig Dispense Refill  . Doxylamine Succinate, Sleep, (SLEEP AID PO) Take 1 tablet by mouth at bedtime.      Marland Kitchen escitalopram (LEXAPRO) 10 MG tablet Take 10 mg by mouth every morning.         Review of Systems:  As per HPI, otherwise negative.    Physical Examination: Filed Vitals:   06/11/12 1315  BP: 146/85  Pulse: 65  Temp: 98 F (36.7 C)  Resp: 16   Filed Vitals:   06/11/12 1315  Height: 5\' 7"  (1.702 m)  Weight: 198 lb (89.812 kg)   Body mass index is 31.01 kg/(m^2). Ideal Body Weight: Weight in (lb) to have BMI = 25: 159.3    GEN: WDWN, NAD, Non-toxic, Alert & Oriented x 3 HEENT: Atraumatic, Normocephalic.  Ears and Nose: No external deformity. EXTR: No clubbing/cyanosis/edema. Global guarding and tenderness of right shoulder.  Wrist and thumb exam benign with full PROM NEURO: Normal gait.  PSYCH: Normally interactive. Conversant. Not depressed or anxious appearing.  Calm demeanor.    Assessment and Plan: Wrist and thumb pain Acute shoulder strain.  Possible rotator cuff Ortho consult Sling Anaprox  ice  Carmelina Dane, MD   [facility] reading (PRIMARY) by  Dr. Dareen Piano.  No acute injury.

## 2012-06-11 NOTE — Patient Instructions (Addendum)
Shoulder Pain The shoulder is a ball and socket joint. The muscles and tendons (rotator cuff) are what keep the shoulder in its joint and stable. This collection of muscles and tendons holds in the head (ball) of the humerus (upper arm bone) in the fossa (cup) of the scapula (shoulder blade). Today no reason was found for your shoulder pain. Often pain in the shoulder may be treated conservatively with temporary immobilization. For example, holding the shoulder in one place using a sling for rest. Physical therapy may be needed if problems continue. HOME CARE INSTRUCTIONS   Apply ice to the sore area for 15 to 20 minutes, 3 to 4 times per day for the first 2 days. Put the ice in a plastic bag. Place a towel between the bag of ice and your skin.   If you have or were given a shoulder sling and straps, do not remove for as long as directed by your caregiver or until you see a caregiver for a follow-up examination. If you need to remove it to shower or bathe, move your arm as little as possible.   Sleep on several pillows at night to lessen swelling and pain.   Only take over-the-counter or prescription medicines for pain, discomfort, or fever as directed by your caregiver.   Keep any follow-up appointments in order to avoid any type of permanent shoulder disability or chronic pain problems.  SEEK MEDICAL CARE IF:   Pain in your shoulder increases or new pain develops in your arm, hand, or fingers.   Your hand or fingers are colder than your other hand.   You do not obtain pain relief with the medications or your pain becomes worse.  SEEK IMMEDIATE MEDICAL CARE IF:   Your arm, hand, or fingers are numb or tingling.   Your arm, hand, or fingers are swollen, painful, or turn white or blue.   You develop chest pain or shortness of breath.  MAKE SURE YOU:   Understand these instructions.   Will watch your condition.   Will get help right away if you are not doing well or get worse.    Document Released: 07/30/2005 Document Revised: 10/09/2011 Document Reviewed: 10/04/2011 [employer] Patient Information 2012 [employer]. 

## 2012-06-11 NOTE — Progress Notes (Deleted)
  Subjective:    Patient ID: Kathy Stephens, female    DOB: Apr 08, 1961, 51 y.o.   MRN: [id number]  HPI    Review of Systems     Objective:   Physical Exam        Assessment & Plan:

## 2012-08-04 ENCOUNTER — Other Ambulatory Visit: Payer: Self-pay | Admitting: Obstetrics & Gynecology

## 2012-08-04 DIAGNOSIS — Z1231 Encounter for screening mammogram for malignant neoplasm of breast: Secondary | ICD-10-CM

## 2012-08-31 ENCOUNTER — Ambulatory Visit
Admission: RE | Admit: 2012-08-31 | Discharge: 2012-08-31 | Disposition: A | Discharge status: Home / Self Care | Payer: 59 | Source: Ambulatory Visit | Attending: Obstetrics & Gynecology | Admitting: Obstetrics & Gynecology

## 2012-08-31 DIAGNOSIS — Z1231 Encounter for screening mammogram for malignant neoplasm of breast: Secondary | ICD-10-CM

## 2012-12-08 ENCOUNTER — Other Ambulatory Visit: Payer: Self-pay | Admitting: Nurse Practitioner

## 2012-12-08 DIAGNOSIS — M899 Disorder of bone, unspecified: Secondary | ICD-10-CM

## 2012-12-16 ENCOUNTER — Other Ambulatory Visit: Payer: 59

## 2013-02-03 ENCOUNTER — Ambulatory Visit (INDEPENDENT_AMBULATORY_CARE_PROVIDER_SITE_OTHER): Payer: 59 | Admitting: Emergency Medicine

## 2013-02-03 VITALS — BP 132/82 | HR 69 | Temp 98.8°F | Resp 17 | Ht 66.0 in | Wt 209.0 lb

## 2013-02-03 DIAGNOSIS — L255 Unspecified contact dermatitis due to plants, except food: Secondary | ICD-10-CM

## 2013-02-03 DIAGNOSIS — E782 Mixed hyperlipidemia: Secondary | ICD-10-CM | POA: Insufficient documentation

## 2013-02-03 DIAGNOSIS — E785 Hyperlipidemia, unspecified: Secondary | ICD-10-CM

## 2013-02-03 DIAGNOSIS — E7800 Pure hypercholesterolemia, unspecified: Secondary | ICD-10-CM

## 2013-02-03 DIAGNOSIS — R5381 Other malaise: Secondary | ICD-10-CM

## 2013-02-03 DIAGNOSIS — R635 Abnormal weight gain: Secondary | ICD-10-CM

## 2013-02-03 DIAGNOSIS — R0683 Snoring: Secondary | ICD-10-CM

## 2013-02-03 DIAGNOSIS — R5383 Other fatigue: Secondary | ICD-10-CM

## 2013-02-03 DIAGNOSIS — L237 Allergic contact dermatitis due to plants, except food: Secondary | ICD-10-CM

## 2013-02-03 HISTORY — DX: Hyperlipidemia, unspecified: E78.5

## 2013-02-03 LAB — COMPREHENSIVE METABOLIC PANEL
ALT: 15 U/L (ref 0–35)
AST: 18 U/L (ref 0–37)
Albumin: 4.4 g/dL (ref 3.5–5.2)
Alkaline Phosphatase: 105 U/L (ref 39–117)
BUN: 10 mg/dL (ref 6–23)
CO2: 30 mEq/L (ref 19–32)
Calcium: 9.7 mg/dL (ref 8.4–10.5)
Chloride: 103 mEq/L (ref 96–112)
Creat: 0.81 mg/dL (ref 0.50–1.10)
Glucose, Bld: 93 mg/dL (ref 70–99)
Potassium: 4.5 mEq/L (ref 3.5–5.3)
Sodium: 138 mEq/L (ref 135–145)
Total Bilirubin: 0.4 mg/dL (ref 0.3–1.2)
Total Protein: 7.3 g/dL (ref 6.0–8.3)

## 2013-02-03 LAB — POCT CBC
Granulocyte percent: 59.8 %G (ref 37–80)
HCT, POC: 43.7 % (ref 37.7–47.9)
Hemoglobin: 13.7 g/dL (ref 12.2–16.2)
Lymph, poc: 2.3 (ref 0.6–3.4)
MCH, POC: 29 pg (ref 27–31.2)
MCHC: 31.4 g/dL — AB (ref 31.8–35.4)
MCV: 92.3 fL (ref 80–97)
MID (cbc): 0.4 (ref 0–0.9)
MPV: 9.4 fL (ref 0–99.8)
POC Granulocyte: 4 (ref 2–6.9)
POC LYMPH PERCENT: 33.6 %L (ref 10–50)
POC MID %: 6.6 %M (ref 0–12)
Platelet Count, POC: 190 10*3/uL (ref 142–424)
RBC: 4.73 M/uL (ref 4.04–5.48)
RDW, POC: 13.5 %
WBC: 6.7 10*3/uL (ref 4.6–10.2)

## 2013-02-03 LAB — TSH: TSH: 1.985 u[IU]/mL (ref 0.350–4.500)

## 2013-02-03 LAB — LIPID PANEL
Cholesterol: 259 mg/dL — ABNORMAL HIGH (ref 0–200)
HDL: 50 mg/dL (ref >39)
LDL Cholesterol: 178 mg/dL — ABNORMAL HIGH (ref 0–99)
Total CHOL/HDL Ratio: 5.2 Ratio
Triglycerides: 155 mg/dL — ABNORMAL HIGH (ref <150)
VLDL: 31 mg/dL (ref 0–40)

## 2013-02-03 LAB — T4, FREE: Free T4: 1.08 ng/dL (ref 0.80–1.80)

## 2013-02-03 NOTE — Patient Instructions (Addendum)

## 2013-02-03 NOTE — Progress Notes (Signed)
  Subjective:    Patient ID: Kathy Stephens, female    DOB: [birth year], 52 y.o.   MRN: [id number]  Poison [place]ers This is a new problem. The current episode started yesterday (2 days ago). The problem has been gradually worsening (spreading all over body) since onset. The affected locations include the face, scalp, chest, right eye, right upper leg, right arm and left upper leg. The rash is characterized by itchiness, redness and scaling. She was exposed to plant contact. Past treatments include anti-itch cream. The treatment provided mild relief.   Patient here today with complaint of having poison ivy on both her arms  chests right side of eye and right side of face notice this on Tuesday while she was outside moving trees. She has used [facility] [facility] it has helped some for the itch. She also states that she was at her OBGYN office and they tested her Lipid and the results came back high and wanted her to recheck at her primary Doctors office to recheck labs. Also patient want to discuss about her not loosing weight has had gall bladder taken out and been walking but doesn't see any results wants to discuss about getting the lapband    Review of Systems  Skin: Positive for rash.       Objective:   Physical Exam patient is alert and cooperative. She is evidence of poison ivy on the side of her face and both arms. She also has an outbreak between her breasts.        Assessment & Plan:  Patient has high cholesterol problems with weight gain his attention and discussing lap band surgery. The chart says she has an allergy to prednisone however she has been given shots in the past of some medication we are checking on. We'll treat the poison ivy symptomatically. Get her scheduled for a sleep study and have repeated her labs to include a sugar blood count thyroid as well as her cholesterol.

## 2013-02-18 ENCOUNTER — Telehealth: Payer: Self-pay | Admitting: *Deleted

## 2013-02-18 NOTE — Telephone Encounter (Signed)
Message left on voicemail that we received a referral from Dr. Cleta Alberts.  Please call to schedule a Sleep Consult.

## 2013-03-09 ENCOUNTER — Institutional Professional Consult (permissible substitution): Payer: 59 | Admitting: Neurology

## 2013-04-28 ENCOUNTER — Ambulatory Visit
Admission: RE | Admit: 2013-04-28 | Discharge: 2013-04-28 | Disposition: A | Discharge status: Home / Self Care | Payer: Worker's Compensation | Source: Ambulatory Visit | Attending: Occupational Medicine | Admitting: Occupational Medicine

## 2013-04-28 ENCOUNTER — Other Ambulatory Visit: Payer: Self-pay | Admitting: Occupational Medicine

## 2013-04-28 DIAGNOSIS — R609 Edema, unspecified: Secondary | ICD-10-CM

## 2013-04-28 DIAGNOSIS — T1490XA Injury, unspecified, initial encounter: Secondary | ICD-10-CM

## 2013-06-01 ENCOUNTER — Encounter: Payer: Self-pay | Admitting: Certified Nurse Midwife

## 2013-06-02 ENCOUNTER — Ambulatory Visit (INDEPENDENT_AMBULATORY_CARE_PROVIDER_SITE_OTHER): Payer: 59 | Admitting: Certified Nurse Midwife

## 2013-06-02 ENCOUNTER — Encounter: Payer: Self-pay | Admitting: Certified Nurse Midwife

## 2013-06-02 VITALS — BP 120/64 | HR 72 | Resp 16 | Ht 63.5 in | Wt 207.0 lb

## 2013-06-02 DIAGNOSIS — G43909 Migraine, unspecified, not intractable, without status migrainosus: Secondary | ICD-10-CM

## 2013-06-02 DIAGNOSIS — M8580 Other specified disorders of bone density and structure, unspecified site: Secondary | ICD-10-CM

## 2013-06-02 DIAGNOSIS — E559 Vitamin D deficiency, unspecified: Secondary | ICD-10-CM

## 2013-06-02 DIAGNOSIS — F411 Generalized anxiety disorder: Secondary | ICD-10-CM

## 2013-06-02 DIAGNOSIS — C801 Malignant (primary) neoplasm, unspecified: Secondary | ICD-10-CM

## 2013-06-02 DIAGNOSIS — D649 Anemia, unspecified: Secondary | ICD-10-CM

## 2013-06-02 DIAGNOSIS — Z01419 Encounter for gynecological examination (general) (routine) without abnormal findings: Secondary | ICD-10-CM

## 2013-06-02 HISTORY — DX: Other specified disorders of bone density and structure, unspecified site: M85.80

## 2013-06-02 HISTORY — DX: Migraine, unspecified, not intractable, without status migrainosus: G43.909

## 2013-06-02 HISTORY — DX: Anemia, unspecified: D64.9

## 2013-06-02 HISTORY — DX: Malignant (primary) neoplasm, unspecified: C80.1

## 2013-06-02 MED ORDER — ESCITALOPRAM OXALATE 10 MG PO TABS: 15.0000 mg | ORAL_TABLET | Freq: Every morning | ORAL | Status: DC

## 2013-06-02 NOTE — Patient Instructions (Signed)

## 2013-06-02 NOTE — Progress Notes (Signed)
52 y.o. G2P1001 Married Caucasian Fe here for annual exam.  Menopausal no HRT.  Denies vaginal bleeding or vaginal dryness. Saw PCP for elevated Lipid profile, under follow up. Lexapro 15 mg working OK, still crys when she thinks of her mother, but just recently lost sister in law to cervical cancer. Never went to Hospice grief counseling, but feels now is the time. Working on weight loss and exercise with "girls" at work. No other health issues today.  Patient's last menstrual period was 11/03/2006.          Sexually active: yes  The current method of family planning is tubal ligation.    Exercising: yes  walking Smoker:  yes  Health Maintenance: Pap:  06-01-12 neg HPV neg MMG: 08-31-12 Colonoscopy:  2012 negative 5 years BMD:  2008 TDaP:  05-23-10 Labs: none Self breast exam: not done   reports that she has been smoking Cigarettes.  She has a 12 pack-year smoking history. She has never used smokeless tobacco. She reports that  drinks alcohol. She reports that she does not use illicit drugs.  Past Medical History  Diagnosis Date  . Allergy   . Anxiety   . Depression   . GERD (gastroesophageal reflux disease)   . Fibromyalgia   . Fibromyalgia   . Anemia   . Migraines   . Osteopenia   . Cancer     skin    Past Surgical History  Procedure Laterality Date  . Cesarean section    . Tendon relief    . Left oophorectomy    . Foot surgery  1980's  . Cholecystectomy  03/19/2012    Procedure: LAPAROSCOPIC CHOLECYSTECTOMY;  Surgeon: Cherylynn Ridges, MD;  Location: [facility] OR;  Service: General;  Laterality: N/A;  . Tubal ligation    . Laparoscopy      with cystectomy    Current Outpatient Prescriptions  Medication Sig Dispense Refill  . Doxylamine Succinate, Sleep, (SLEEP AID PO) Take 1 tablet by mouth at bedtime.      Marland Kitchen escitalopram (LEXAPRO) 10 MG tablet Take by mouth every morning. Take 1 1/2 daily      . naproxen sodium (ANAPROX DS) 550 MG tablet Take 1 tablet (550 mg total) by  mouth 2 (two) times daily with a meal.  40 tablet  0   No current facility-administered medications for this visit.    Family History  Problem Relation Age of Onset  . Breast cancer Mother   . Cancer Mother     breast  . Hypertension Mother   . Breast cancer Maternal Aunt   . Wilson's disease Maternal Grandfather   . Cancer Maternal Grandfather   . Heart disease Maternal Grandfather   . Heart disease Paternal Grandmother   . Colon cancer Paternal Grandfather   . Cancer Paternal Grandfather   . Cancer Father     bladder    ROS:  Pertinent items are noted in HPI.  Otherwise, a comprehensive ROS was negative.  Exam:   BP 120/64  Pulse 72  Resp 16  Ht 5' 3.5" (1.613 m)  Wt 207 lb (93.895 kg)  BMI 36.09 kg/m2  LMP 11/03/2006 Height: 5' 3.5" (161.3 cm)  Ht Readings from Last 3 Encounters:  06/02/13 5' 3.5" (1.613 m)  02/03/13 5\' 6"  (1.676 m)  06/11/12 5\' 7"  (1.702 m)    General appearance: alert, cooperative and appears stated age Head: Normocephalic, without obvious abnormality, atraumatic Neck: no adenopathy, supple, symmetrical, trachea midline and thyroid normal  to inspection and palpation Lungs: clear to auscultation bilaterally Breasts: normal appearance, no masses or tenderness, No nipple retraction or dimpling, No nipple discharge or bleeding, No axillary or supraclavicular adenopathy Heart: regular rate and rhythm Abdomen: soft, non-tender; no masses,  no organomegaly Extremities: extremities normal, atraumatic, no cyanosis or edema Skin: Skin color, texture, turgor normal. No rashes or lesions Lymph nodes: Cervical, supraclavicular, and axillary nodes normal. No abnormal inguinal nodes palpated Neurologic: Grossly normal   Pelvic: External genitalia:  no lesions              Urethra:  normal appearing urethra with no masses, tenderness or lesions              Bartholin's and Skene's: normal                 Vagina: normal appearing vagina with normal color  and discharge, no lesions              Cervix: normal, non tender              Pap taken: no Bimanual Exam:  Uterus:  normal size, contour, position, consistency, mobility, non-tender and anteverted              Adnexa: normal adnexa and no mass, fullness, tenderness               Rectovaginal: Confirms               Anus:  normal sphincter tone, no lesions  A:  Well Woman with normal exam  Menopausal no HRT  Anxiety, Lexapro working well  Family History of breast cancer, mother  Elevated Lipid panel under PCP management  History of Vitamin D deficiency  P:   Reviewed health and wellness pertinent to exam  Aware of need to evaluate if vaginal bleeding  Discussed proceeding with Hospice grief counseling and also seeking family and friends support. Patient will advise if she feels Lexapro is not working.  Rx Lexapro see order  Stressed importance of SBE, Mammogram, aware of genetic screening. "Patient chooses not to check breast or be offered genetic screening"   Continue follow up as indicated.  Lab: Vitamin D  Pap smear as per guidelines   Mammogram yearly pap smear not taken  counseled on breast self exam, mammography screening, adequate intake of calcium and vitamin D, diet and exercise  return annually or prn  An After Visit Summary was printed and given to the patient.

## 2013-06-03 LAB — VITAMIN D 25 HYDROXY (VIT D DEFICIENCY, FRACTURES): Vit D, 25-Hydroxy: 33 ng/mL (ref 30–89)

## 2013-06-03 NOTE — Progress Notes (Signed)
Note reviewed, agree with plan.  Teller Wakefield, MD  

## 2013-09-08 ENCOUNTER — Other Ambulatory Visit: Payer: Self-pay

## 2013-09-19 ENCOUNTER — Encounter (HOSPITAL_COMMUNITY): Payer: Self-pay | Admitting: Emergency Medicine

## 2013-09-19 ENCOUNTER — Emergency Department (HOSPITAL_COMMUNITY)
Admission: EM | Admit: 2013-09-19 | Discharge: 2013-09-19 | Disposition: A | Discharge status: Home / Self Care | Payer: 59 | Source: Home / Self Care | Attending: Family Medicine | Admitting: Family Medicine

## 2013-09-19 ENCOUNTER — Telehealth (INDEPENDENT_AMBULATORY_CARE_PROVIDER_SITE_OTHER): Payer: Self-pay

## 2013-09-19 DIAGNOSIS — R52 Pain, unspecified: Secondary | ICD-10-CM

## 2013-09-19 DIAGNOSIS — R109 Unspecified abdominal pain: Secondary | ICD-10-CM

## 2013-09-19 LAB — POCT URINALYSIS DIP (DEVICE)
Bilirubin Urine: NEGATIVE
Glucose, UA: NEGATIVE mg/dL
Hgb urine dipstick: NEGATIVE
Ketones, ur: NEGATIVE mg/dL
Leukocytes, UA: NEGATIVE
Nitrite: NEGATIVE
Protein, ur: NEGATIVE mg/dL
Specific Gravity, Urine: >=1.03 (ref 1.005–1.030)
Urobilinogen, UA: 0.2 mg/dL (ref 0.0–1.0)
pH: 6 (ref 5.0–8.0)

## 2013-09-19 MED ORDER — HYDROCODONE-ACETAMINOPHEN 5-325 MG PO TABS: 1.0000 | ORAL_TABLET | Freq: Four times a day (QID) | ORAL | Status: DC | PRN

## 2013-09-19 NOTE — ED Provider Notes (Addendum)
CSN: [id number]     Arrival date & time 09/19/13  1756 History   None    Chief Complaint  Patient presents with  . Flank Pain   (Consider location/radiation/quality/duration/timing/severity/associated sxs/prior Treatment) Patient is a 52 y.o. female presenting with flank pain.  Flank Pain This is a new problem. The current episode started more than 2 days ago (sx similar to gb problem prev,). The problem has not changed since onset.Pertinent negatives include no abdominal pain.    Past Medical History  Diagnosis Date  . Allergy   . Anxiety   . Depression   . GERD (gastroesophageal reflux disease)   . Fibromyalgia   . Anemia   . Migraines   . Osteopenia   . Cancer     skin (nose & face)   Past Surgical History  Procedure Laterality Date  . Cesarean section    . Tendon relief    . Left oophorectomy    . Foot surgery  1980's  . Cholecystectomy  03/19/2012    Procedure: LAPAROSCOPIC CHOLECYSTECTOMY;  Surgeon: Cherylynn Ridges, MD;  Location: [facility] OR;  Service: General;  Laterality: N/A;  . Tubal ligation    . Laparoscopy      with cystectomy   Family History  Problem Relation Age of Onset  . Breast cancer Mother   . Hypertension Mother   . Breast cancer Maternal Aunt   . Cancer Maternal Grandfather   . Heart disease Maternal Grandfather   . Heart disease Paternal Grandmother   . Colon cancer Paternal Grandfather   . Cancer Paternal Grandfather     testicular  . Cancer Father     associated with colon polyps   History  Substance Use Topics  . Smoking status: Current Every Day Smoker -- 0.80 packs/day for 15 years    Types: Cigarettes  . Smokeless tobacco: Never Used  . Alcohol Use: 0 - .5 oz/week    0-1 drink(s) per week     Comment: socially   OB History   Grav Para Term Preterm Abortions TAB SAB Ect Mult Living   1 1 1       1      Review of Systems  Constitutional: Negative.  Negative for fever.  Respiratory: Negative.   Cardiovascular: Negative.    Gastrointestinal: Positive for nausea. Negative for vomiting, abdominal pain, diarrhea and constipation.  Genitourinary: Positive for flank pain.    Allergies  Prednisone  Home Medications   Current Outpatient Rx  Name  Route  Sig  Dispense  Refill  . escitalopram (LEXAPRO) 10 MG tablet   Oral   Take 1.5 tablets (15 mg total) by mouth every morning. Take 1 1/2 daily   45 tablet   4   . Doxylamine Succinate, Sleep, (SLEEP AID PO)   Oral   Take 1 tablet by mouth at bedtime.         Marland Kitchen HYDROcodone-acetaminophen (NORCO/VICODIN) 5-325 MG per tablet   Oral   Take 1 tablet by mouth every 6 (six) hours as needed.   15 tablet   0    BP 148/90  Pulse 65  Temp(Src) 98.4 F (36.9 C) (Oral)  SpO2 98%  LMP 11/03/2006 Physical Exam  Nursing note and vitals reviewed. Constitutional: She is oriented to person, place, and time. She appears well-developed and well-nourished.  Cardiovascular: Regular rhythm and normal heart sounds.   Pulmonary/Chest: Effort normal and breath sounds normal. She exhibits tenderness.  Abdominal: Soft. Bowel sounds are normal. She exhibits  no distension and no mass. There is tenderness. There is CVA tenderness. There is no rebound and no guarding.  Local acute reproducible tenderness on right 11th-12 rib to palpation directly.  Neurological: She is alert and oriented to person, place, and time.  Skin: Skin is warm and dry.    ED Course  Procedures (including critical care time) Labs Review Labs Reviewed  POCT URINALYSIS DIP (DEVICE)   Imaging Review No results found.  EKG Interpretation    Date/Time:    Ventricular Rate:    PR Interval:    QRS Duration:   QT Interval:    QTC Calculation:   R Axis:     Text Interpretation:              MDM   1. Acute right flank pain        Linna Hoff, MD 09/19/13 2004  Linna Hoff, MD 09/19/13 2116

## 2013-09-19 NOTE — ED Notes (Signed)
C/o pain right flank area, nausea since Thursday, c/o can feel lump in her flank; had GB surgery 2013

## 2013-09-19 NOTE — Telephone Encounter (Signed)
The pt called and states she had gallbladder surgery last May 2013.  She has been having pain and nausea like she had before surgery.  She would like to see Dr Lindie Spruce.  I told her I can get her in his first available which is December 2nd.  I told her it's unlikely related to the surgery this far out and she should see her primary MD to begin with and see if work up is needed.  She said ok thank you and hung up.

## 2013-09-21 ENCOUNTER — Ambulatory Visit (INDEPENDENT_AMBULATORY_CARE_PROVIDER_SITE_OTHER): Payer: 59 | Admitting: Family Medicine

## 2013-09-21 ENCOUNTER — Ambulatory Visit: Payer: 59

## 2013-09-21 VITALS — BP 132/82 | HR 85 | Temp 98.7°F | Resp 17 | Ht 65.0 in | Wt 207.0 lb

## 2013-09-21 DIAGNOSIS — R0789 Other chest pain: Secondary | ICD-10-CM

## 2013-09-21 DIAGNOSIS — S29011A Strain of muscle and tendon of front wall of thorax, initial encounter: Secondary | ICD-10-CM

## 2013-09-21 DIAGNOSIS — D1739 Benign lipomatous neoplasm of skin and subcutaneous tissue of other sites: Secondary | ICD-10-CM

## 2013-09-21 DIAGNOSIS — R071 Chest pain on breathing: Secondary | ICD-10-CM

## 2013-09-21 DIAGNOSIS — M940 Chondrocostal junction syndrome [Tietze]: Secondary | ICD-10-CM

## 2013-09-21 DIAGNOSIS — D1730 Benign lipomatous neoplasm of skin and subcutaneous tissue of unspecified sites: Secondary | ICD-10-CM

## 2013-09-21 DIAGNOSIS — IMO0002 Reserved for concepts with insufficient information to code with codable children: Secondary | ICD-10-CM

## 2013-09-21 DIAGNOSIS — E785 Hyperlipidemia, unspecified: Secondary | ICD-10-CM

## 2013-09-21 LAB — POCT CBC
Granulocyte percent: 59.8 %G (ref 37–80)
HCT, POC: 48.6 % — AB (ref 37.7–47.9)
Hemoglobin: 15.1 g/dL (ref 12.2–16.2)
Lymph, poc: 2.3 (ref 0.6–3.4)
MCH, POC: 29.6 pg (ref 27–31.2)
MCHC: 31.1 g/dL — AB (ref 31.8–35.4)
MCV: 95.3 fL (ref 80–97)
MID (cbc): 0.5 (ref 0–0.9)
MPV: 9.9 fL (ref 0–99.8)
POC Granulocyte: 4.3 (ref 2–6.9)
POC LYMPH PERCENT: 32.6 %L (ref 10–50)
POC MID %: 7.6 %M (ref 0–12)
Platelet Count, POC: 187 10*3/uL (ref 142–424)
RBC: 5.1 M/uL (ref 4.04–5.48)
RDW, POC: 13 %
WBC: 7.2 10*3/uL (ref 4.6–10.2)

## 2013-09-21 MED ORDER — ATORVASTATIN CALCIUM 40 MG PO TABS: 40.0000 mg | ORAL_TABLET | Freq: Every day | ORAL | Status: DC

## 2013-09-21 NOTE — Progress Notes (Signed)
Subjective: Several days ago they started having pain in her right side of the abdomen and right flank. She went to her acupuncturist because she felt a little lump there on her side. The acupuncturist said she would not do anything with her this [facility] and did a urinalysis which was normal but said she might need a CT of it. They did not get one since it was late at night. She continues to hurt so she came in here today.  Objective: No acute distress. Chest is clear to auscultation. Heart regular without murmurs. She has what feels like a lipoma overlying the low right lateral rib line. Just posterior to the lipomatous where she is most tender. Her abdomen soft without masses or tenderness until palpating all the ribs themselves. Laying down she says she hurt her shoulder blades. She started had her gallbladder out.  Assessment: Chest wall pain Lipoma  Plan: Rib series and CBC  Results for orders placed in visit on 09/21/13  POCT CBC      Result Value Range   WBC 7.2  4.6 - 10.2 K/uL   Lymph, poc 2.3  0.6 - 3.4   POC LYMPH PERCENT 32.6  10 - 50 %L   MID (cbc) 0.5  0 - 0.9   POC MID % 7.6  0 - 12 %M   POC Granulocyte 4.3  2 - 6.9   Granulocyte percent 59.8  37 - 80 %G   RBC 5.10  4.04 - 5.48 M/uL   Hemoglobin 15.1  12.2 - 16.2 g/dL   HCT, POC 88.4 (*) 16.6 - 47.9 %   MCV 95.3  80 - 97 fL   MCH, POC 29.6  27 - 31.2 pg   MCHC 31.1 (*) 31.8 - 35.4 g/dL   RDW, POC 06.3     Platelet Count, POC 187  142 - 424 K/uL   MPV 9.9  0 - 99.8 fL   [facility] reading (PRIMARY) by  Dr. Alwyn Ren Normal ribs  Assessment: Chest wall pain, probably from strain or contusion of the cartilage rib interface Small lipoma.

## 2013-09-21 NOTE — Patient Instructions (Signed)
Continue taking the Aleve 2 pills twice daily. If the chest wall continues to bother you return for recheck you again.  Take the Lipitor 40 mg one daily as indicated from your previous visit.  Try to avoid twisting and straining your right side.

## 2013-09-26 ENCOUNTER — Encounter (HOSPITAL_COMMUNITY): Payer: Self-pay | Admitting: Emergency Medicine

## 2013-09-26 ENCOUNTER — Emergency Department (HOSPITAL_COMMUNITY)
Admission: EM | Admit: 2013-09-26 | Discharge: 2013-09-26 | Disposition: A | Discharge status: Home / Self Care | Payer: 59 | Attending: Emergency Medicine | Admitting: Emergency Medicine

## 2013-09-26 ENCOUNTER — Emergency Department (HOSPITAL_COMMUNITY)
Admission: EM | Admit: 2013-09-26 | Discharge: 2013-09-26 | Disposition: A | Discharge status: Nursing Home (Includes ICF) | Payer: 59 | Source: Home / Self Care | Attending: Family Medicine | Admitting: Family Medicine

## 2013-09-26 ENCOUNTER — Emergency Department (HOSPITAL_COMMUNITY): Payer: 59

## 2013-09-26 DIAGNOSIS — R072 Precordial pain: Secondary | ICD-10-CM | POA: Insufficient documentation

## 2013-09-26 DIAGNOSIS — Z862 Personal history of diseases of the blood and blood-forming organs and certain disorders involving the immune mechanism: Secondary | ICD-10-CM | POA: Insufficient documentation

## 2013-09-26 DIAGNOSIS — Z9089 Acquired absence of other organs: Secondary | ICD-10-CM | POA: Insufficient documentation

## 2013-09-26 DIAGNOSIS — R1013 Epigastric pain: Secondary | ICD-10-CM | POA: Insufficient documentation

## 2013-09-26 DIAGNOSIS — F411 Generalized anxiety disorder: Secondary | ICD-10-CM | POA: Insufficient documentation

## 2013-09-26 DIAGNOSIS — Z8739 Personal history of other diseases of the musculoskeletal system and connective tissue: Secondary | ICD-10-CM | POA: Insufficient documentation

## 2013-09-26 DIAGNOSIS — G43909 Migraine, unspecified, not intractable, without status migrainosus: Secondary | ICD-10-CM | POA: Insufficient documentation

## 2013-09-26 DIAGNOSIS — Z8719 Personal history of other diseases of the digestive system: Secondary | ICD-10-CM | POA: Insufficient documentation

## 2013-09-26 DIAGNOSIS — R109 Unspecified abdominal pain: Secondary | ICD-10-CM

## 2013-09-26 DIAGNOSIS — Z79899 Other long term (current) drug therapy: Secondary | ICD-10-CM | POA: Insufficient documentation

## 2013-09-26 DIAGNOSIS — Z85828 Personal history of other malignant neoplasm of skin: Secondary | ICD-10-CM | POA: Insufficient documentation

## 2013-09-26 DIAGNOSIS — F17200 Nicotine dependence, unspecified, uncomplicated: Secondary | ICD-10-CM | POA: Insufficient documentation

## 2013-09-26 LAB — URINALYSIS, ROUTINE W REFLEX MICROSCOPIC
Bilirubin Urine: NEGATIVE
Glucose, UA: NEGATIVE mg/dL
Hgb urine dipstick: NEGATIVE
Ketones, ur: NEGATIVE mg/dL
Nitrite: NEGATIVE
Protein, ur: NEGATIVE mg/dL
Specific Gravity, Urine: 1.018 (ref 1.005–1.030)
Urobilinogen, UA: 0.2 mg/dL (ref 0.0–1.0)
pH: 5 (ref 5.0–8.0)

## 2013-09-26 LAB — COMPREHENSIVE METABOLIC PANEL
ALT: 13 U/L (ref 0–35)
AST: 16 U/L (ref 0–37)
Albumin: 4 g/dL (ref 3.5–5.2)
Alkaline Phosphatase: 114 U/L (ref 39–117)
BUN: 13 mg/dL (ref 6–23)
CO2: 29 mEq/L (ref 19–32)
Calcium: 9.5 mg/dL (ref 8.4–10.5)
Chloride: 100 mEq/L (ref 96–112)
Creatinine, Ser: 0.85 mg/dL (ref 0.50–1.10)
GFR calc Af Amer: >90 mL/min (ref >90)
GFR calc non Af Amer: 78 mL/min — ABNORMAL LOW (ref >90)
Glucose, Bld: 109 mg/dL — ABNORMAL HIGH (ref 70–99)
Potassium: 5 mEq/L (ref 3.5–5.1)
Sodium: 138 mEq/L (ref 135–145)
Total Bilirubin: 0.2 mg/dL — ABNORMAL LOW (ref 0.3–1.2)
Total Protein: 7.7 g/dL (ref 6.0–8.3)

## 2013-09-26 LAB — URINE MICROSCOPIC-ADD ON

## 2013-09-26 LAB — CBC WITH DIFFERENTIAL/PLATELET
Basophils Absolute: 0 10*3/uL (ref 0.0–0.1)
Basophils Relative: 0 % (ref 0–1)
Eosinophils Absolute: 0.2 10*3/uL (ref 0.0–0.7)
Eosinophils Relative: 2 % (ref 0–5)
HCT: 43.9 % (ref 36.0–46.0)
Hemoglobin: 14.9 g/dL (ref 12.0–15.0)
Lymphocytes Relative: 22 % (ref 12–46)
Lymphs Abs: 2 10*3/uL (ref 0.7–4.0)
MCH: 31 pg (ref 26.0–34.0)
MCHC: 33.9 g/dL (ref 30.0–36.0)
MCV: 91.3 fL (ref 78.0–100.0)
Monocytes Absolute: 0.6 10*3/uL (ref 0.1–1.0)
Monocytes Relative: 7 % (ref 3–12)
Neutro Abs: 6.3 10*3/uL (ref 1.7–7.7)
Neutrophils Relative %: 69 % (ref 43–77)
Platelets: 176 10*3/uL (ref 150–400)
RBC: 4.81 MIL/uL (ref 3.87–5.11)
RDW: 12.6 % (ref 11.5–15.5)
WBC: 9.2 10*3/uL (ref 4.0–10.5)

## 2013-09-26 LAB — TROPONIN I: Troponin I: <0.3 ng/mL (ref <0.30)

## 2013-09-26 NOTE — ED Notes (Signed)
Dr. Glick at bedside.  

## 2013-09-26 NOTE — ED Notes (Signed)
Pt states "feel like gallbladder attack but had gallbladder out."

## 2013-09-26 NOTE — ED Provider Notes (Signed)
52 year old female had gallbladder surgery about one year ago and was doing well until about one week ago she started having pain in the right flank area with some radiation to the right lower abdomen. She relates that her bowel movements are somewhat greasy. There is no associated nausea or vomiting. She had seen a physician in urgent care who recommended a CT scan but she declined at that time. She went back to a different urgent care Center where she was told that the pain was probably in her rib muscles. Pain is now radiating to the right upper abdomen and epigastric area now sent to the interscapular area. There is no nausea vomiting or fever. She denies any dysuria. On exam, lungs are clear heart has regular rate and rhythm. There is no interscapular tenderness and no chest wall tenderness. There is bilateral CVA tenderness-worse on the right than on the left. There is also epigastric and right upper quadrant tenderness. There is no lower abdominal tenderness. No rebound or guarding. Peristalsis is present. Urinalysis is unremarkable and transaminases are normal. She will be sent for CT scan. I do feel that her biliary tree is most likely the source of her pain and she will be referred to gastroenterology for followup if CT does not show obvious cause for her pain.  I saw and evaluated the patient, reviewed the resident's note and I agree with the findings and plan.  EKG Interpretation    Date/Time:  Monday September 26 2013 14:12:41 EST Ventricular Rate:  68 PR Interval:  150 QRS Duration: 78 QT Interval:  394 QTC Calculation: 418 R Axis:   86 Text Interpretation:  Normal sinus rhythm Normal ECG No significant change since last tracing Confirmed by Preston Fleeting  MD, Deborh Pense ([id number]) on 09/26/2013 9:05:40 PM              Dione Booze, MD 09/27/13 [phone]

## 2013-09-26 NOTE — ED Notes (Signed)
Pt comfortable with d/c and f/u instructions. No prescriptions 

## 2013-09-26 NOTE — ED Provider Notes (Signed)
Kathy Stephens is a 52 y.o. female who presents to Urgent Care today for epigastric abdominal to central chest pain. This is been present for the last several days. Patient has had off and on chest and abdominal pain over the last several days. This is her third visit to urgent care for this issue. This morning the pain awoke her from sleep. She notes central lower chest pain radiating to the central back. She notes that the pain is not exertional. She notes the pain did not significantly change following eating but she did develop nausea 30-60 minutes after eating. Additionally she notes fatty dark-colored diarrhea.  She denies any vomiting palpitations or shortness of breath. She denies any melanotic stool.  Patient has a past surgical history for laparoscopic cholecystectomy  Past Medical History  Diagnosis Date  . Allergy   . Anxiety   . Depression   . GERD (gastroesophageal reflux disease)   . Fibromyalgia   . Anemia   . Migraines   . Osteopenia   . Cancer     skin (nose & face)   History  Substance Use Topics  . Smoking status: Current Every Day Smoker -- 0.80 packs/day for 15 years    Types: Cigarettes  . Smokeless tobacco: Never Used  . Alcohol Use: 0 - .5 oz/week    0-1 drink(s) per week     Comment: socially   ROS as above Medications reviewed. No current facility-administered medications for this encounter.   Current Outpatient Prescriptions  Medication Sig Dispense Refill  . atorvastatin (LIPITOR) 40 MG tablet Take 1 tablet (40 mg total) by mouth daily.  30 tablet  3  . Doxylamine Succinate, Sleep, (SLEEP AID PO) Take 1 tablet by mouth at bedtime.      Marland Kitchen escitalopram (LEXAPRO) 10 MG tablet Take 1.5 tablets (15 mg total) by mouth every morning. Take 1 1/2 daily  45 tablet  4  . HYDROcodone-acetaminophen (NORCO/VICODIN) 5-325 MG per tablet Take 1 tablet by mouth every 6 (six) hours as needed.  15 tablet  0    Exam:  BP 156/102  Pulse 68  Temp(Src) 98.3 F (36.8 C)  (Oral)  Resp 16  SpO2 100%  LMP 11/03/2006 Gen: Well NAD HEENT: EOMI,  MMM Lungs: Normal work of breathing. CTABL Heart: RRR no MRG  chest wall: Tender to palpation sternum.    Abd: NABS, Soft. Nondistended. Mildly tender epigastric central abdomen Exts: Non edematous BL  LE, warm and well perfused.  Back is nontender/  Twelve-lead EKG shows normal sinus rhythm at 65 beats per minute. Normal otherwise.  Assessment and Plan: 52 y.o. female with central chest 2 epigastric abdominal pain. Unclear etiology. Likely pancreatitis. Plan to transfer to the emergency room for further evaluation and management.  Discussed warning signs or symptoms. Please see discharge instructions. Patient expresses understanding.      Rodolph Bong, MD 09/26/13 [phone]

## 2013-09-26 NOTE — ED Provider Notes (Signed)
CSN: [id number]     Arrival date & time 09/26/13  1350 History   First MD Initiated Contact with Patient 09/26/13 1800     Chief Complaint  Patient presents with  . Flank Pain  . Chest Pain   (Consider location/radiation/quality/duration/timing/severity/associated sxs/prior Treatment) HPI Kathy Stephens is a 52 y.o. female who presented to the emergency department with 7 days of R sided abdominal and flank pain.  Radiates in to chest and behind shoulder blades.  4/10 in severity.  No alleviating/aggravating factors.  Constant in nature.  No fevers.  Still tolerating PO.  No N/V.  No diarrhea.  No constipation.  No other symptoms.  Past Medical History  Diagnosis Date  . Allergy   . Anxiety   . Depression   . GERD (gastroesophageal reflux disease)   . Fibromyalgia   . Anemia   . Migraines   . Osteopenia   . Cancer     skin (nose & face)   Past Surgical History  Procedure Laterality Date  . Cesarean section    . Tendon relief    . Left oophorectomy    . Foot surgery  1980's  . Cholecystectomy  03/19/2012    Procedure: LAPAROSCOPIC CHOLECYSTECTOMY;  Surgeon: Cherylynn Ridges, MD;  Location: [facility] OR;  Service: General;  Laterality: N/A;  . Tubal ligation    . Laparoscopy      with cystectomy   Family History  Problem Relation Age of Onset  . Breast cancer Mother   . Hypertension Mother   . Breast cancer Maternal Aunt   . Cancer Maternal Grandfather   . Heart disease Maternal Grandfather   . Heart disease Paternal Grandmother   . Colon cancer Paternal Grandfather   . Cancer Paternal Grandfather     testicular  . Cancer Father     associated with colon polyps   History  Substance Use Topics  . Smoking status: Current Every Day Smoker -- 0.80 packs/day for 15 years    Types: Cigarettes  . Smokeless tobacco: Never Used  . Alcohol Use: 0 - .5 oz/week    0-1 drink(s) per week     Comment: socially   OB History   Grav Para Term Preterm Abortions TAB SAB Ect Mult Living    1 1 1       1      Review of Systems  Constitutional: Negative for fever and chills.  HENT: Negative for congestion and rhinorrhea.   Respiratory: Negative for cough and shortness of breath.   Cardiovascular: Positive for chest pain.  Gastrointestinal: Positive for abdominal pain. Negative for nausea, vomiting, diarrhea and abdominal distention.  Endocrine: Negative for polyuria.  Genitourinary: Negative for dysuria.  Musculoskeletal: Negative for neck pain and neck stiffness.  Skin: Negative for rash.  Neurological: Negative for headaches.  Psychiatric/Behavioral: Negative.     Allergies  Prednisone  Home Medications   Current Outpatient Rx  Name  Route  Sig  Dispense  Refill  . Alum Hydroxide-Mag Trisilicate (GAVISCON) 80-14.2 MG CHEW   Oral   Chew 1 tablet by mouth 2 (two) times daily as needed (for heartburn).         Marland Kitchen atorvastatin (LIPITOR) 40 MG tablet   Oral   Take 40 mg by mouth daily.         . Doxylamine Succinate, Sleep, (SLEEP AID PO)   Oral   Take 1 tablet by mouth at bedtime.         Marland Kitchen escitalopram (  LEXAPRO) 10 MG tablet   Oral   Take 20 mg by mouth daily.         Marland Kitchen HYDROcodone-acetaminophen (NORCO/VICODIN) 5-325 MG per tablet   Oral   Take 1 tablet by mouth every 6 (six) hours as needed for moderate pain.          BP 156/71  Pulse 60  Temp(Src) 98.2 F (36.8 C) (Oral)  Resp 19  SpO2 98%  LMP 11/03/2006 Physical Exam  Nursing note and vitals reviewed. Constitutional: She is oriented to person, place, and time. She appears well-developed and well-nourished. No distress.  HENT:  Head: Normocephalic and atraumatic.  Right Ear: External ear normal.  Left Ear: External ear normal.  Nose: Nose normal.  Mouth/Throat: Oropharynx is clear and moist. No oropharyngeal exudate.  Eyes: EOM are normal. Pupils are equal, round, and reactive to light.  Neck: Normal range of motion. Neck supple. No tracheal deviation present.  Cardiovascular:  Normal rate.   Pulmonary/Chest: Effort normal and breath sounds normal. No stridor. No respiratory distress. She has no wheezes. She has no rales.  Abdominal: Soft. She exhibits no distension. There is tenderness in the epigastric area. There is CVA tenderness (R sided). There is no rebound.  Musculoskeletal: Normal range of motion.  Neurological: She is alert and oriented to person, place, and time.  Skin: Skin is warm and dry. She is not diaphoretic.    ED Course  Procedures (including critical care time) Labs Review Labs Reviewed  COMPREHENSIVE METABOLIC PANEL - Abnormal; Notable for the following:    Glucose, Bld 109 (*)    Total Bilirubin 0.2 (*)    GFR calc non Af Amer 78 (*)    All other components within normal limits  URINALYSIS, ROUTINE W REFLEX MICROSCOPIC - Abnormal; Notable for the following:    APPearance CLOUDY (*)    Leukocytes, UA SMALL (*)    All other components within normal limits  URINE MICROSCOPIC-ADD ON - Abnormal; Notable for the following:    Squamous Epithelial / LPF MANY (*)    Bacteria, UA FEW (*)    All other components within normal limits  URINE CULTURE  CBC WITH DIFFERENTIAL  TROPONIN I   Imaging Review Ct Abdomen Pelvis Wo Contrast  09/26/2013   CLINICAL DATA:  Right flank and chest pain for 5 days.  EXAM: CT ABDOMEN AND PELVIS WITHOUT CONTRAST  TECHNIQUE: Multidetector CT imaging of the abdomen and pelvis was performed following the standard protocol without intravenous contrast.  COMPARISON:  None.  FINDINGS: Mild dependent changes in the lung bases. Small esophageal hiatal hernia.  Surgical absence of the gallbladder. No bile duct dilatation. The unenhanced appearance of the liver, spleen, pancreas, adrenal glands, kidneys, abdominal aorta, inferior vena cava, and retroperitoneal lymph nodes is unremarkable. The stomach, small bowel, and colon are mostly decompressed. No free air or free fluid in the abdomen. Abdominal wall musculature appears  intact. No radiopaque stones demonstrated in the kidneys or ureters.  Pelvis: Uterus and ovaries are not enlarged. Bladder wall is not thickened. Scattered diverticula in the sigmoid colon without evidence of diverticulitis. The appendix is not specifically identified. No free or loculated pelvic fluid collections. No destructive bone lesions appreciated.  IMPRESSION: No acute abnormality demonstrated on unenhanced imaging of the abdomen or pelvis.   Electronically Signed   By: Burman Nieves M.D.   On: 09/26/2013 21:35   Dg Chest 2 View  09/26/2013   CLINICAL DATA:  Substernal chest pain.  Hypertension.  Nausea.  EXAM: CHEST  2 VIEW  COMPARISON:  09/21/13  FINDINGS: The heart size and mediastinal contours are within normal limits. Both lungs are clear. The visualized skeletal structures are unremarkable.  IMPRESSION: No active cardiopulmonary disease.   Electronically Signed   By: Myles Rosenthal M.D.   On: 09/26/2013 20:24    EKG Interpretation    Date/Time:  Monday September 26 2013 14:12:41 EST Ventricular Rate:  68 PR Interval:  150 QRS Duration: 78 QT Interval:  394 QTC Calculation: 418 R Axis:   86 Text Interpretation:  Normal sinus rhythm Normal ECG No significant change since last tracing Confirmed by [facility]  MD, DAVID ([id number]) on 09/26/2013 9:05:40 PM            MDM   1. Abdominal pain    ALINNA SIPLE is a 52 y.o. female who presents to the ED with R sided abdominal pain and flank pain x 7 days.  Workup initiated and with no significant abnormalities.  Patient with history of cholecystectomy but no significant tenderness or abnormalities in hepatic labs to indicate retained stone or cholangitis.  CT of abdomen negative.  Patient tolerating PO.  Atypical story for cardiac cause.  CXR done and negative.  Possible mild biliary colic, gastritis, or ulcer.  Recommend f/u with GI for rpeeat evaluation and continued management.  Patient safe for discharge home.  Patient  discharged.    Arloa Koh, MD 09/27/13 [phone]

## 2013-09-26 NOTE — ED Notes (Signed)
Rt flank pain since last Tuesday  And then today pain between shoulder blades in back and then radiated to around front to chest and felt sob has been nauseated

## 2013-09-26 NOTE — ED Notes (Signed)
Pt states she was seen at [facility]gent [facility] last Monday for pain that was very similar to her gallbladder pain before she had it removed, lower right back. Pt states she was told to come to get scans if she wanted but she did not. Pt went to PCP and was given vicodin. Pt gave it a few days and then went to see Dr dobbs who xrayed her chest and was told she had a lypoma where she was feeling the pain in her back. Pt states last night pain was unbearable and has continued throughout the day. Pt c/o headaches without change in vision and lightheadedness without numbness/tingling and w/o dizziness. Pt states normal bowel movements for her. Pt states very nauseous and loss of appetite. Pt states pain starts in chest and stabs through shoulder blades. NAD noted at this time.

## 2013-09-26 NOTE — ED Notes (Signed)
Pt c/o chest tightness/pain and upper back pain onset last night Sxs also include: nauseas, HA Denies: SOB, blurry vision, vomiting, diaphoresis, weakness Seen here last week for right flank pain She is alert w/no signs of acute distress.

## 2013-09-27 LAB — URINE CULTURE: Colony Count: 30000

## 2013-12-15 ENCOUNTER — Other Ambulatory Visit: Payer: Self-pay | Admitting: Family

## 2013-12-15 ENCOUNTER — Ambulatory Visit
Admission: RE | Admit: 2013-12-15 | Discharge: 2013-12-15 | Disposition: A | Discharge status: Home / Self Care | Payer: No Typology Code available for payment source | Source: Ambulatory Visit | Attending: Family | Admitting: Family

## 2013-12-15 DIAGNOSIS — R0689 Other abnormalities of breathing: Secondary | ICD-10-CM

## 2013-12-15 DIAGNOSIS — T148XXA Other injury of unspecified body region, initial encounter: Secondary | ICD-10-CM

## 2013-12-15 DIAGNOSIS — R52 Pain, unspecified: Secondary | ICD-10-CM

## 2014-03-03 ENCOUNTER — Ambulatory Visit (INDEPENDENT_AMBULATORY_CARE_PROVIDER_SITE_OTHER): Payer: 59 | Admitting: Emergency Medicine

## 2014-03-03 VITALS — BP 126/84 | HR 83 | Temp 98.7°F | Resp 16 | Ht 63.5 in | Wt 201.6 lb

## 2014-03-03 DIAGNOSIS — J018 Other acute sinusitis: Secondary | ICD-10-CM

## 2014-03-03 DIAGNOSIS — J209 Acute bronchitis, unspecified: Secondary | ICD-10-CM

## 2014-03-03 MED ORDER — PROMETHAZINE-CODEINE 6.25-10 MG/5ML PO SYRP: 5.0000 mL | ORAL_SOLUTION | Freq: Four times a day (QID) | ORAL | Status: DC | PRN

## 2014-03-03 MED ORDER — PSEUDOEPHEDRINE-GUAIFENESIN ER 60-600 MG PO TB12: 1.0000 | ORAL_TABLET | Freq: Two times a day (BID) | ORAL | Status: AC

## 2014-03-03 MED ORDER — AMOXICILLIN-POT CLAVULANATE 875-125 MG PO TABS: 1.0000 | ORAL_TABLET | Freq: Two times a day (BID) | ORAL | Status: DC

## 2014-03-03 NOTE — Progress Notes (Signed)
[facility] [address] [place] [address] [phone]  Date:  03/03/2014   Name:  Kathy Stephens   DOB:  1961-04-18   MRN:  [id number]  PCP:  Leandrew Koyanagi, MD    Chief Complaint: Headache, Cough, Fever and Generalized Body Aches   History of Present Illness:  Kathy Stephens is a 53 y.o. very pleasant female patient who presents with the following:  Ill all week with nasal congestion and post nasal drainage.  Has no sore throat.  Pronounced cough.  No wheezing or shortness of breath.  No nausea or vomiting.  Cough non productive.  Has chilled feeling with no documented fevers.  Multiple sweats.  Malaise, fatigue and myalgias.  No improvement with over the counter medications or other home remedies. Denies other complaint or health concern today.   Patient Active Problem List   Diagnosis Date Noted  . Other and unspecified hyperlipidemia 02/03/2013  . Postop check 04/14/2012  . Cholecystitis with cholelithiasis 03/20/2012    Past Medical History  Diagnosis Date  . Allergy   . Anxiety   . Depression   . GERD (gastroesophageal reflux disease)   . Fibromyalgia   . Anemia   . Migraines   . Osteopenia   . Cancer     skin (nose & face)    Past Surgical History  Procedure Laterality Date  . Cesarean section    . Tendon relief    . Left oophorectomy    . Foot surgery  1980's  . Cholecystectomy  03/19/2012    Procedure: LAPAROSCOPIC CHOLECYSTECTOMY;  Surgeon: Gwenyth Ober, MD;  Location: [place];  Service: General;  Laterality: N/A;  . Tubal ligation    . Laparoscopy      with cystectomy    History  Substance Use Topics  . Smoking status: Current Every Day Smoker -- 0.80 packs/day for 15 years    Types: Cigarettes  . Smokeless tobacco: Never Used  . Alcohol Use: 0.0 - 0.5 oz/week    0-1 drink(s) per week     Comment: socially    Family History  Problem Relation Age of Onset  . Breast cancer Mother   . Hypertension Mother   .  Breast cancer Maternal Aunt   . Cancer Maternal Grandfather   . Heart disease Maternal Grandfather   . Heart disease Paternal Grandmother   . Colon cancer Paternal Grandfather   . Cancer Paternal Grandfather     testicular  . Cancer Father     associated with colon polyps    Allergies  Allergen Reactions  . Prednisone     Mouth swelled up    Medication list has been reviewed and updated.  Current Outpatient Prescriptions on File Prior to Visit  Medication Sig Dispense Refill  . Doxylamine Succinate, Sleep, (SLEEP AID PO) Take 1 tablet by mouth at bedtime.      Marland Kitchen escitalopram (LEXAPRO) 10 MG tablet Take 20 mg by mouth daily.      . Alum Hydroxide-Mag Trisilicate (GAVISCON) 18-84.1 MG CHEW Chew 1 tablet by mouth 2 (two) times daily as needed (for heartburn).      Marland Kitchen HYDROcodone-acetaminophen (NORCO/VICODIN) 5-325 MG per tablet Take 1 tablet by mouth every 6 (six) hours as needed for moderate pain.       No current facility-administered medications on file prior to visit.    Review of Systems:  As per HPI, otherwise negative.    Physical Examination: Filed Vitals:  03/03/14 1122  BP: 126/84  Pulse: 83  Temp: 98.7 F (37.1 C)  Resp: 16   Filed Vitals:   03/03/14 1122  Height: 5' 3.5" (1.613 m)  Weight: 201 lb 9.6 oz (91.445 kg)   Body mass index is 35.15 kg/(m^2). Ideal Body Weight: Weight in (lb) to have BMI = 25: 143.1  GEN: WDWN, NAD, Non-toxic, A & O x 3 HEENT: Atraumatic, Normocephalic. Neck supple. No masses, No LAD. Ears and Nose: No external deformity.  Purulent nasal drainage CV: RRR, No M/G/R. No JVD. No thrill. No extra heart sounds. PULM: CTA B, no wheezes, crackles, rhonchi. No retractions. No resp. distress. No accessory muscle use. ABD: S, NT, ND, +BS. No rebound. No HSM. EXTR: No c/c/e NEURO Normal gait.  PSYCH: Normally interactive. Conversant. Not depressed or anxious appearing.  Calm demeanor.    Assessment and  Plan: Sinusitis Bronchitis augmentin mucinex Phen c cod   Signed,  Ellison Carwin, MD

## 2014-03-03 NOTE — Patient Instructions (Signed)

## 2014-06-11 ENCOUNTER — Ambulatory Visit (INDEPENDENT_AMBULATORY_CARE_PROVIDER_SITE_OTHER): Payer: 59 | Admitting: Emergency Medicine

## 2014-06-11 VITALS — BP 138/90 | HR 72 | Temp 98.3°F | Resp 16 | Ht 64.0 in | Wt 202.4 lb

## 2014-06-11 DIAGNOSIS — L0202 Furuncle of face: Secondary | ICD-10-CM

## 2014-06-11 DIAGNOSIS — H6000 Abscess of external ear, unspecified ear: Secondary | ICD-10-CM

## 2014-06-11 DIAGNOSIS — L0203 Carbuncle of face: Secondary | ICD-10-CM

## 2014-06-11 MED ORDER — MUPIROCIN CALCIUM 2 % EX CREA: 1.0000 "application " | TOPICAL_CREAM | Freq: Two times a day (BID) | CUTANEOUS | Status: DC

## 2014-06-11 MED ORDER — FLUCONAZOLE 150 MG PO TABS: 150.0000 mg | ORAL_TABLET | Freq: Once | ORAL | Status: DC

## 2014-06-11 MED ORDER — DOXYCYCLINE MONOHYDRATE 100 MG PO TABS: 100.0000 mg | ORAL_TABLET | Freq: Two times a day (BID) | ORAL | Status: DC

## 2014-06-11 NOTE — Progress Notes (Signed)
   Subjective:    Patient ID: Kathy Stephens, female    DOB: August 21, 1961, 53 y.o.   MRN: [id number]  HPI   53 year old caucasian female complains of "bump" in right ear.  Has periodic episodes of same, this time is not going away,  Complains of outer ear tenderness.   Pt complains of right sided neck lymph node tenderness.     Review of Systems     Objective:   Physical Exam is tenderness to touch at the entrance to the right external auditory canal. There is some puffiness underneath the earlobe but no definite masses no definite lymphadenopathy. The TM is clear on the right and the left the throat is normal    Assessment & Plan:  We'll treat with beeper tabs and Bactroban ointment. She was also given a prescription for Diflucan to have because she tends to develop yeast infections.

## 2015-01-24 ENCOUNTER — Other Ambulatory Visit: Payer: Self-pay

## 2015-01-24 DIAGNOSIS — Z1239 Encounter for other screening for malignant neoplasm of breast: Secondary | ICD-10-CM

## 2015-02-01 ENCOUNTER — Ambulatory Visit
Admission: RE | Admit: 2015-02-01 | Discharge: 2015-02-01 | Disposition: A | Discharge status: Home / Self Care | Payer: 59 | Source: Ambulatory Visit

## 2015-02-01 DIAGNOSIS — Z1239 Encounter for other screening for malignant neoplasm of breast: Secondary | ICD-10-CM

## 2015-10-01 ENCOUNTER — Encounter: Payer: Self-pay | Admitting: Internal Medicine

## 2016-01-28 ENCOUNTER — Other Ambulatory Visit: Payer: Self-pay

## 2016-01-28 DIAGNOSIS — Z1231 Encounter for screening mammogram for malignant neoplasm of breast: Secondary | ICD-10-CM

## 2016-02-12 ENCOUNTER — Ambulatory Visit: Payer: Self-pay

## 2016-05-31 ENCOUNTER — Ambulatory Visit (INDEPENDENT_AMBULATORY_CARE_PROVIDER_SITE_OTHER): Payer: 59 | Admitting: Physician Assistant

## 2016-05-31 ENCOUNTER — Ambulatory Visit (INDEPENDENT_AMBULATORY_CARE_PROVIDER_SITE_OTHER): Payer: 59

## 2016-05-31 VITALS — BP 132/90 | HR 91 | Temp 98.4°F | Resp 17 | Ht 64.0 in | Wt 191.0 lb

## 2016-05-31 DIAGNOSIS — M25551 Pain in right hip: Secondary | ICD-10-CM

## 2016-05-31 MED ORDER — MELOXICAM 7.5 MG PO TABS: 7.5000 mg | ORAL_TABLET | Freq: Every day | ORAL | 0 refills | Status: DC | PRN

## 2016-05-31 NOTE — Progress Notes (Signed)
Kathy Stephens  MRN: [id number] DOB: [birth year]  Subjective:  Kathy Stephens is a 55 y.o. female seen in office today for a chief complaint of right hip pain x 2 months.  Pt was watching tv with girl friends 2 months ago and jumped up to go to the bathroom. While she was running to the bathroom she made a sharp turn and noticed a stabbing pain in right hip as soon as she made this movment. The pain has progressed since then. Notes a sharp pain with any movement and a dull pain when she is sitting and sleeping.  Notes that the pain is worse in the morning, when she is just sitting,and when she is walking up the stairs. Gets some relief when she applies pressure to it during sleep (i.e., when she sleeps on the affected side) and after she is up walking around for a bit. Denies numbness, tingling, gait disturbance. Has tried intermittent aleve with mild relief.   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   Review of Systems  All other systems reviewed and are negative.   Patient Active Problem List   Diagnosis Date Noted  . Other and unspecified hyperlipidemia 02/03/2013    Current Outpatient Prescriptions on File Prior to Visit  Medication Sig Dispense Refill  . Alum Hydroxide-Mag Trisilicate (GAVISCON) A999333 MG CHEW Chew 1 tablet by mouth 2 (two) times daily as needed (for heartburn).    . Doxylamine Succinate, Sleep, (SLEEP AID PO) Take 1 tablet by mouth at bedtime.    Marland Kitchen escitalopram (LEXAPRO) 10 MG tablet Take 20 mg by mouth daily.    Marland Kitchen esomeprazole (NEXIUM 24HR) 20 MG capsule Take 20 mg by mouth daily at 12 noon.    . fluconazole (DIFLUCAN) 150 MG tablet Take 1 tablet (150 mg total) by mouth once. Repeat if needed (Patient not taking: Reported on 05/31/2016) 2 tablet 0  . HYDROcodone-acetaminophen (NORCO/VICODIN) 5-325 MG per tablet Take 1 tablet by mouth every 6 (six) hours as needed for moderate pain.    . promethazine-codeine (PHENERGAN WITH CODEINE) 6.25-10 MG/5ML syrup  Take 5-10 mLs by mouth every 6 (six) hours as needed. 120 mL 0   No current facility-administered medications on file prior to visit.     Allergies  Allergen Reactions  . Prednisone     Mouth swelled up     Objective:  BP 132/90 (BP Location: Right Arm, Patient Position: Sitting, Cuff Size: Normal)   Pulse 91   Temp 98.4 F (36.9 C) (Oral)   Resp 17   Ht 5\' 4"  (1.626 m)   Wt 191 lb (86.6 kg)   LMP 11/03/2006   SpO2 100%   BMI 32.79 kg/m   Physical Exam  Constitutional: She is oriented to person, place, and time and well-developed, well-nourished, and in no distress.  HENT:  Head: Normocephalic and atraumatic.  Eyes: Conjunctivae are normal.  Neck: Normal range of motion.  Pulmonary/Chest: Effort normal.  Musculoskeletal:       Right hip: She exhibits normal range of motion, normal strength, no swelling and no crepitus. Tenderness: tenderness to palpation in two distinct locations of lateral hip.       Left hip: Normal.       Legs: Neurological: She is alert and oriented to person, place, and time. She has normal sensation and normal strength. Gait normal.  Skin: Skin is warm and dry.  Psychiatric: Affect normal.  Vitals reviewed.  Dg Hip Unilat W Or W/o  Pelvis 2-3 Views Right  Result Date: 05/31/2016 CLINICAL DATA:  Pain after turning on right hip/ leg on June 1st EXAM: DG HIP (WITH OR WITHOUT PELVIS) 2-3V RIGHT COMPARISON:  None. FINDINGS: SI joints and hip joints are symmetric and unremarkable. No acute bony abnormality. Specifically, no fracture, subluxation, or dislocation. Soft tissues are intact. IMPRESSION: Negative. Electronically Signed   By: Rolm Baptise M.D.   On: 05/31/2016 13:19   Assessment and Plan :   1. Right hip pain - DG HIP UNILAT W OR W/O PELVIS 2-3 VIEWS RIGHT; Future - meloxicam (MOBIC) 7.5 MG tablet; Take 1-2 tablets (7.5-15 mg total) by mouth daily for the next week.  Dispense: 60 tablet; Refill: 0 -Apply heat to affected area 4-5 times a  day for the next week. -Call office in one week if no relief in symptoms for referral to orthopedics or physical therapy   Tenna Delaine PA-C  [facility] 05/31/2016 1:13 PM

## 2016-05-31 NOTE — Patient Instructions (Addendum)
Call our office in one week in no relief in symptoms for referral to orthopedics  Apply ice/heat to affected area 4-5 times a day for 20-30 minutes at a time   Take mobic daily for the next week and try to rest.     IF you received an x-ray today, you will receive an invoice from [facility]. Please contact [facility] at [phone] with questions or concerns regarding your invoice.   IF you received labwork today, you will receive an invoice from [employer]. Please contact [facility] at [phone] with questions or concerns regarding your invoice.   Our billing staff will not be able to assist you with questions regarding bills from these companies.  You will be contacted with the lab results as soon as they are available. The fastest way to get your results is to activate your My Chart account. Instructions are located on the last page of this paperwork. If you have not heard from [place] regarding the results in 2 weeks, please contact this office.

## 2016-06-06 ENCOUNTER — Telehealth: Payer: Self-pay

## 2016-06-06 DIAGNOSIS — M25551 Pain in right hip: Secondary | ICD-10-CM

## 2016-06-06 NOTE — Telephone Encounter (Signed)
Pt is wanting to let us know nothing is helping and patient is wanting be referred to caffery and MRI  Best number [phone]

## 2016-06-10 ENCOUNTER — Encounter: Payer: Self-pay | Admitting: Emergency Medicine

## 2016-06-20 NOTE — Telephone Encounter (Signed)
The patient is calling back to check on her request for an MRI.  She left a message on 8/4 and has not heard anything.  Please call the patient and update her.  Thank you.  CB#: [phone]

## 2016-06-26 ENCOUNTER — Telehealth: Payer: Self-pay | Admitting: Physician Assistant

## 2016-06-26 NOTE — Telephone Encounter (Signed)
The patient is calling back to check on her request for an MRI.  She left a message on 8/4 and again on 8/18.  She said that she discussed an MRI at her last office visit on 7/29 with [place], PA-C.  I saw on the OV notes a possible referral to an orthopedist/PT, but the patient states that an MRI was discussed, not ortho/PT.  Please advise - the patient is upset that nothing has been addressed.  CB#: [phone]

## 2016-06-26 NOTE — Telephone Encounter (Signed)
Patient was contacted and orthopedicsurgery referral was placed. I have apologized to patient for the miscommunication in the referral situation. She understands and is glad that the referral is finally in place. She was told to call me directly if she does not hear from the referral pool in 2 weeks.   Tenna Delaine, PA-C  [facility] 06/26/2016 2:59 PM

## 2016-12-11 DIAGNOSIS — M25551 Pain in right hip: Secondary | ICD-10-CM

## 2016-12-15 ENCOUNTER — Ambulatory Visit (INDEPENDENT_AMBULATORY_CARE_PROVIDER_SITE_OTHER): Payer: 59 | Admitting: Emergency Medicine

## 2016-12-15 ENCOUNTER — Encounter: Payer: Self-pay | Admitting: Emergency Medicine

## 2016-12-15 VITALS — BP 140/84 | HR 76 | Wt 182.6 lb

## 2016-12-15 DIAGNOSIS — M545 Low back pain, unspecified: Secondary | ICD-10-CM

## 2016-12-15 MED ORDER — KETOROLAC TROMETHAMINE 60 MG/2ML IM SOLN
60.0000 mg | Freq: Once | INTRAMUSCULAR | Status: AC
  Administered 2016-12-15: 60 mg via INTRAMUSCULAR

## 2016-12-15 MED ORDER — DICLOFENAC SODIUM 75 MG PO TBEC: 75.0000 mg | DELAYED_RELEASE_TABLET | Freq: Two times a day (BID) | ORAL | 0 refills | Status: DC

## 2016-12-15 NOTE — Progress Notes (Signed)
Kathy Stephens 56 y.o.   Chief Complaint  Patient presents with  . Back Pain    right low back x 4 days/hx of this    HISTORY OF PRESENT ILLNESS: This is a 56 y.o. female complaining of right sided low back pain x 4-5 days.  Back Pain  This is a new problem. The current episode started in the past 7 days. The problem occurs constantly. The problem has been waxing and waning since onset. The pain is present in the lumbar spine. The quality of the pain is described as aching. The pain does not radiate. The pain is at a severity of 7/10. The pain is moderate. The symptoms are aggravated by bending, position and twisting. Stiffness is present all day. Pertinent negatives include no abdominal pain, bladder incontinence, bowel incontinence, chest pain, dysuria, fever, headaches, leg pain, numbness, paresis, paresthesias, pelvic pain, perianal numbness, tingling, weakness or weight loss. She has tried ice, heat, analgesics and NSAIDs for the symptoms. The treatment provided mild relief.     Prior to Admission medications   Medication Sig Start Date End Date Taking? Authorizing Provider  Alum Hydroxide-Mag Trisilicate (GAVISCON) [id number] MG CHEW Chew 1 tablet by mouth 2 (two) times daily as needed (for heartburn).   Yes Historical Provider, MD  Doxylamine Succinate, Sleep, (SLEEP AID PO) Take 1 tablet by mouth at bedtime.   Yes Historical Provider, MD  esomeprazole (NEXIUM 24HR) 20 MG capsule Take 20 mg by mouth daily at 12 noon.   Yes Historical Provider, MD  diclofenac (VOLTAREN) 75 MG EC tablet Take 1 tablet (75 mg total) by mouth 2 (two) times daily. 12/15/16   Horald Pollen, MD  escitalopram (LEXAPRO) 10 MG tablet Take 20 mg by mouth daily.    Historical Provider, MD  fluconazole (DIFLUCAN) 150 MG tablet Take 1 tablet (150 mg total) by mouth once. Repeat if needed Patient not taking: Reported on 05/31/2016 06/11/14   Darlyne Russian, MD  HYDROcodone-acetaminophen (NORCO/VICODIN) 5-325 MG per  tablet Take 1 tablet by mouth every 6 (six) hours as needed for moderate pain.    Historical Provider, MD  meloxicam (MOBIC) 7.5 MG tablet Take 1-2 tablets (7.5-15 mg total) by mouth daily as needed for pain. Patient not taking: Reported on 12/15/2016 05/31/16   Leonie Douglas, PA-C  promethazine-codeine [facility] WITH CODEINE) 6.25-10 MG/5ML syrup Take 5-10 mLs by mouth every 6 (six) hours as needed. Patient not taking: Reported on 12/15/2016 03/03/14   Roselee Culver, MD    Allergies  Allergen Reactions  . Prednisone     Mouth swelled up    Patient Active Problem List   Diagnosis Date Noted  . Right hip pain 12/11/2016  . Other and unspecified hyperlipidemia 02/03/2013    Past Medical History:  Diagnosis Date  . Allergy   . Anemia   . Anxiety   . Cancer ([place])    skin (nose & face)  . Depression   . Fibromyalgia   . GERD (gastroesophageal reflux disease)   . Migraines   . Osteopenia     Past Surgical History:  Procedure Laterality Date  . CESAREAN SECTION    . CHOLECYSTECTOMY  03/19/2012   Procedure: LAPAROSCOPIC CHOLECYSTECTOMY;  Surgeon: Gwenyth Ober, MD;  Location: [facility];  Service: General;  Laterality: N/A;  . FOOT SURGERY  1980's  . LAPAROSCOPY     with cystectomy  . LEFT OOPHORECTOMY    . tendon relief    . TUBAL LIGATION  Social History   Social History  . Marital status: Married    Spouse name: N/A  . Number of children: N/A  . Years of education: N/A   Occupational History  . Not on file.   Social History Main Topics  . Smoking status: Current Every Day Smoker    Packs/day: 0.80    Years: 15.00    Types: Cigarettes  . Smokeless tobacco: Never Used  . Alcohol use 0.0 - 0.5 oz/week     Comment: socially  . Drug use: No  . Sexual activity: Yes    Partners: Male    Birth control/ protection: Surgical     Comment: BTL   Other Topics Concern  . Not on file   Social History Narrative  . No narrative on file    Family History    Problem Relation Age of Onset  . Breast cancer Mother   . Hypertension Mother   . Cancer Mother   . Stroke Mother   . Cancer Maternal Grandfather   . Heart disease Maternal Grandfather   . Heart disease Paternal Grandmother   . Colon cancer Paternal Grandfather   . Cancer Paternal Grandfather     testicular  . Cancer Father     associated with colon polyps  . Heart disease Father   . Hyperlipidemia Father   . Breast cancer Maternal Aunt      Review of Systems  Constitutional: Negative for fever and weight loss.  HENT: Negative.   Eyes: Negative.   Respiratory: Negative for cough, hemoptysis, shortness of breath and wheezing.   Cardiovascular: Negative for chest pain, palpitations and leg swelling.  Gastrointestinal: Negative for abdominal pain, bowel incontinence, diarrhea, nausea and vomiting.  Genitourinary: Negative for bladder incontinence, dysuria, flank pain, hematuria and pelvic pain.  Musculoskeletal: Positive for back pain. Negative for myalgias and neck pain.  Skin: Negative for rash.  Neurological: Negative for dizziness, tingling, sensory change, focal weakness, weakness, numbness, headaches and paresthesias.  Endo/Heme/Allergies: Does not bruise/bleed easily.  Psychiatric/Behavioral: Negative.   All other systems reviewed and are negative.  Vitals:   12/15/16 0835  BP: 140/84  Pulse: 76     Physical Exam  Constitutional: She is oriented to person, place, and time. She appears well-developed and well-nourished.  HENT:  Head: Normocephalic and atraumatic.  Nose: Nose normal.  Mouth/Throat: Oropharynx is clear and moist.  Eyes: Conjunctivae and EOM are normal. Pupils are equal, round, and reactive to light.  Neck: Normal range of motion. Neck supple. No JVD present. No thyromegaly present.  Cardiovascular: Normal rate, regular rhythm and normal heart sounds.   Pulmonary/Chest: Effort normal and breath sounds normal.  Abdominal: Soft. Bowel sounds are  normal. She exhibits no distension. There is no tenderness.  Musculoskeletal:       Lumbar back: She exhibits decreased range of motion, tenderness, pain and spasm. She exhibits no bony tenderness.       Back:  Lymphadenopathy:    She has no cervical adenopathy.  Neurological: She is alert and oriented to person, place, and time. She displays normal reflexes. No sensory deficit. She exhibits normal muscle tone. Coordination normal.  Skin: Skin is warm and dry. Capillary refill takes less than 2 seconds.  Psychiatric: She has a normal mood and affect. Her behavior is normal.  Vitals reviewed.    ASSESSMENT & PLAN: Kathy Stephens was seen today for back pain.  Diagnoses and all orders for this visit:  Acute right-sided low back pain without sciatica -  ketorolac (TORADOL) injection 60 mg; Inject 2 mLs (60 mg total) into the muscle once.  Other orders -     diclofenac (VOLTAREN) 75 MG EC tablet; Take 1 tablet (75 mg total) by mouth 2 (two) times daily.    Patient Instructions       IF you received an x-ray today, you will receive an invoice from [facility]. Please contact [facility] at [phone] with questions or concerns regarding your invoice.   IF you received labwork today, you will receive an invoice from [place]. Please contact [facility] at [phone] with questions or concerns regarding your invoice.   Our billing staff will not be able to assist you with questions regarding bills from these companies.  You will be contacted with the lab results as soon as they are available. The fastest way to get your results is to activate your My Chart account. Instructions are located on the last page of this paperwork. If you have not heard from [place] regarding the results in 2 weeks, please contact this office.      Back Pain, Adult Introduction Back pain is very common. The pain often gets better over time. The cause of back pain is usually not dangerous.  Most people can learn to manage their back pain on their own. Follow these instructions at home: Watch your back pain for any changes. The following actions may help to lessen any pain you are feeling:  Stay active. Start with short walks on flat ground if you can. Try to walk farther each day.  Exercise regularly as told by your doctor. Exercise helps your back heal faster. It also helps avoid future injury by keeping your muscles strong and flexible.  Do not sit, drive, or stand in one place for more than 30 minutes.  Do not stay in bed. Resting more than 1-2 days can slow down your recovery.  Be careful when you bend or lift an object. Use good form when lifting:  Bend at your knees.  Keep the object close to your body.  Do not twist.  Sleep on a firm mattress. Lie on your side, and bend your knees. If you lie on your back, put a pillow under your knees.  Take medicines only as told by your doctor.  Put ice on the injured area.  Put ice in a plastic bag.  Place a towel between your skin and the bag.  Leave the ice on for 20 minutes, 2-3 times a day for the first 2-3 days. After that, you can switch between ice and heat packs.  Avoid feeling anxious or stressed. Find good ways to deal with stress, such as exercise.  Maintain a healthy weight. Extra weight puts stress on your back. Contact a doctor if:  You have pain that does not go away with rest or medicine.  You have worsening pain that goes down into your legs or buttocks.  You have pain that does not get better in one week.  You have pain at night.  You lose weight.  You have a fever or chills. Get help right away if:  You cannot control when you poop (bowel movement) or pee (urinate).  Your arms or legs feel weak.  Your arms or legs lose feeling (numbness).  You feel sick to your stomach (nauseous) or throw up (vomit).  You have belly (abdominal) pain.  You feel like you may pass out (faint). This  information is not intended to replace advice given to you  by your health care provider. Make sure you discuss any questions you have with your health care provider. Document Released: 04/07/2008 Document Revised: 03/27/2016 Document Reviewed: 02/21/2014  2017 Elsevier      Agustina Caroli, MD [facility]

## 2016-12-15 NOTE — Patient Instructions (Addendum)
     IF you received an x-ray today, you will receive an invoice from [facility]. Please contact [facility] at [phone] with questions or concerns regarding your invoice.   IF you received labwork today, you will receive an invoice from [place]. Please contact [facility] at [phone] with questions or concerns regarding your invoice.   Our billing staff will not be able to assist you with questions regarding bills from these companies.  You will be contacted with the lab results as soon as they are available. The fastest way to get your results is to activate your My Chart account. Instructions are located on the last page of this paperwork. If you have not heard from [place] regarding the results in 2 weeks, please contact this office.      Back Pain, Adult Introduction Back pain is very common. The pain often gets better over time. The cause of back pain is usually not dangerous. Most people can learn to manage their back pain on their own. Follow these instructions at home: Watch your back pain for any changes. The following actions may help to lessen any pain you are feeling:  Stay active. Start with short walks on flat ground if you can. Try to walk farther each day.  Exercise regularly as told by your doctor. Exercise helps your back heal faster. It also helps avoid future injury by keeping your muscles strong and flexible.  Do not sit, drive, or stand in one place for more than 30 minutes.  Do not stay in bed. Resting more than 1-2 days can slow down your recovery.  Be careful when you bend or lift an object. Use good form when lifting:  Bend at your knees.  Keep the object close to your body.  Do not twist.  Sleep on a firm mattress. Lie on your side, and bend your knees. If you lie on your back, put a pillow under your knees.  Take medicines only as told by your doctor.  Put ice on the injured area.  Put ice in a plastic bag.  Place a towel  between your skin and the bag.  Leave the ice on for 20 minutes, 2-3 times a day for the first 2-3 days. After that, you can switch between ice and heat packs.  Avoid feeling anxious or stressed. Find good ways to deal with stress, such as exercise.  Maintain a healthy weight. Extra weight puts stress on your back. Contact a doctor if:  You have pain that does not go away with rest or medicine.  You have worsening pain that goes down into your legs or buttocks.  You have pain that does not get better in one week.  You have pain at night.  You lose weight.  You have a fever or chills. Get help right away if:  You cannot control when you poop (bowel movement) or pee (urinate).  Your arms or legs feel weak.  Your arms or legs lose feeling (numbness).  You feel sick to your stomach (nauseous) or throw up (vomit).  You have belly (abdominal) pain.  You feel like you may pass out (faint). This information is not intended to replace advice given to you by your health care provider. Make sure you discuss any questions you have with your health care provider. Document Released: 04/07/2008 Document Revised: 03/27/2016 Document Reviewed: 02/21/2014  2017 [employer]vier

## 2017-10-08 DIAGNOSIS — K5190 Ulcerative colitis, unspecified, without complications: Secondary | ICD-10-CM

## 2017-10-08 HISTORY — DX: Ulcerative colitis, unspecified, without complications: K51.90

## 2017-11-26 ENCOUNTER — Other Ambulatory Visit: Payer: Self-pay

## 2017-11-26 ENCOUNTER — Encounter: Payer: Self-pay | Admitting: Physician Assistant

## 2017-11-26 ENCOUNTER — Ambulatory Visit: Payer: 59 | Admitting: Physician Assistant

## 2017-11-26 VITALS — BP 148/102 | HR 85 | Temp 98.0°F | Resp 18 | Ht 64.33 in | Wt 172.8 lb

## 2017-11-26 DIAGNOSIS — Z1231 Encounter for screening mammogram for malignant neoplasm of breast: Secondary | ICD-10-CM

## 2017-11-26 DIAGNOSIS — Z1239 Encounter for other screening for malignant neoplasm of breast: Secondary | ICD-10-CM

## 2017-11-26 DIAGNOSIS — Z124 Encounter for screening for malignant neoplasm of cervix: Secondary | ICD-10-CM | POA: Diagnosis not present

## 2017-11-26 DIAGNOSIS — R030 Elevated blood-pressure reading, without diagnosis of hypertension: Secondary | ICD-10-CM

## 2017-11-26 DIAGNOSIS — R5383 Other fatigue: Secondary | ICD-10-CM | POA: Diagnosis not present

## 2017-11-26 LAB — POCT URINALYSIS DIP (MANUAL ENTRY)
Bilirubin, UA: NEGATIVE
Blood, UA: NEGATIVE
Glucose, UA: NEGATIVE mg/dL
Ketones, POC UA: NEGATIVE mg/dL
Nitrite, UA: NEGATIVE
Protein Ur, POC: NEGATIVE mg/dL
Spec Grav, UA: 1.015 (ref 1.010–1.025)
Urobilinogen, UA: 0.2 E.U./dL
pH, UA: 5.5 (ref 5.0–8.0)

## 2017-11-26 NOTE — Patient Instructions (Addendum)
We will contact you with your lab results.  In the meantime, below is some information about fatigue.  In the meantime, recommend taking a daily multivitamin.  I also recommend scheduling your mammogram.  In terms of elevated blood pressure, I would like you to check your blood pressure at least a couple times over the next week outside of the office and document these values. It is best if you check the blood pressure at different times in the day. Your goal is <140/90. If your values are consistently above this goal, please return to office for further evaluation. If you start to have chest pain, blurred vision, shortness of breath, severe headache, lower leg swelling, or nausea/vomiting please seek care immediately here or at the ED. Thank you for letting me participate in your health and well being.   Fatigue Fatigue is feeling tired all of the time, a lack of energy, or a lack of motivation. Occasional or mild fatigue is often a normal response to activity or life in general. However, long-lasting (chronic) or extreme fatigue may indicate an underlying medical condition. Follow these instructions at home: Watch your fatigue for any changes. The following actions may help to lessen any discomfort you are feeling:  Talk to your health care provider about how much sleep you need each night. Try to get the required amount every night.  Take medicines only as directed by your health care provider.  Eat a healthy and nutritious diet. Ask your health care provider if you need help changing your diet.  Drink enough fluid to keep your urine clear or pale yellow.  Practice ways of relaxing, such as yoga, meditation, massage therapy, or acupuncture.  Exercise regularly.  Change situations that cause you stress. Try to keep your work and personal routine reasonable.  Do not abuse illegal drugs.  Limit alcohol intake to no more than 1 drink per day for nonpregnant women and 2 drinks per day for men.  One drink equals 12 ounces of beer, 5 ounces of wine, or 1 ounces of hard liquor.  Take a multivitamin, if directed by your health care provider.  Contact a health care provider if:  Your fatigue does not get better.  You have a fever.  You have unintentional weight loss or gain.  You have headaches.  You have difficulty: ? Falling asleep. ? Sleeping throughout the night.  You feel angry, guilty, anxious, or sad.  You are unable to have a bowel movement (constipation).  You skin is dry.  Your legs or another part of your body is swollen. Get help right away if:  You feel confused.  Your vision is blurry.  You feel faint or pass out.  You have a severe headache.  You have severe abdominal, pelvic, or back pain.  You have chest pain, shortness of breath, or an irregular or fast heartbeat.  You are unable to urinate or you urinate less than normal.  You develop abnormal bleeding, such as bleeding from the rectum, vagina, nose, lungs, or nipples.  You vomit blood.  You have thoughts about harming yourself or committing suicide.  You are worried that you might harm someone else. This information is not intended to replace advice given to you by your health care provider. Make sure you discuss any questions you have with your health care provider. Document Released: 08/17/2007 Document Revised: 03/27/2016 Document Reviewed: 02/21/2014 Elsevier Interactive Patient Education  2018 [employer].   How to Take Your Blood Pressure You can  take your blood pressure at home with a machine. You may need to check your blood pressure at home:  To check if you have high blood pressure (hypertension).  To check your blood pressure over time.  To make sure your blood pressure medicine is working.  Supplies needed: You will need a blood pressure machine, or monitor. You can buy one at a drugstore or online. When choosing one:  Choose one with an arm cuff.  Choose one  that wraps around your upper arm. Only one finger should fit between your arm and the cuff.  Do not choose one that measures your blood pressure from your wrist or finger.  Your doctor can suggest a monitor. How to prepare Avoid these things for 30 minutes before checking your blood pressure:  Drinking caffeine.  Drinking alcohol.  Eating.  Smoking.  Exercising.  Five minutes before checking your blood pressure:  Pee.  Sit in a dining chair. Avoid sitting in a soft couch or armchair.  Be quiet. Do not talk.  How to take your blood pressure Follow the instructions that came with your machine. If you have a digital blood pressure monitor, these may be the instructions: 1. Sit up straight. 2. Place your feet on the floor. Do not cross your ankles or legs. 3. Rest your left arm at the level of your heart. You may rest it on a table, desk, or chair. 4. Pull up your shirt sleeve. 5. Wrap the blood pressure cuff around the upper part of your left arm. The cuff should be 1 inch (2.5 cm) above your elbow. It is best to wrap the cuff around bare skin. 6. Fit the cuff snugly around your arm. You should be able to place only one finger between the cuff and your arm. 7. Put the cord inside the groove of your elbow. 8. Press the power button. 9. Sit quietly while the cuff fills with air and loses air. 10. Write down the numbers on the screen. 11. Wait 2-3 minutes and then repeat steps 1-10.  What do the numbers mean? Two numbers make up your blood pressure. The first number is called systolic pressure. The second is called diastolic pressure. An example of a blood pressure reading is "120 over 80" (or 120/80). If you are an adult and do not have a medical condition, use this guide to find out if your blood pressure is normal: Normal  First number: below 120.  Second number: below 80. Elevated  First number: 120-129.  Second number: below 80. Hypertension stage 1  First  number: 130-139.  Second number: 80-89. Hypertension stage 2  First number: 140 or above.  Second number: 66 or above. Your blood pressure is above normal even if only the top or bottom number is above normal. Follow these instructions at home:  Check your blood pressure as often as your doctor tells you to.  Take your monitor to your next doctor's appointment. Your doctor will: ? Make sure you are using it correctly. ? Make sure it is working right.  Make sure you understand what your blood pressure numbers should be.  Tell your doctor if your medicines are causing side effects. Contact a doctor if:  Your blood pressure keeps being high. Get help right away if:  Your first blood pressure number is higher than 180.  Your second blood pressure number is higher than 120. This information is not intended to replace advice given to you by your health care provider.  Make sure you discuss any questions you have with your health care provider. Document Released: 10/02/2008 Document Revised: 09/17/2016 Document Reviewed: 03/28/2016 Elsevier Interactive Patient Education  2018 [employer].  Preventing Hypertension Hypertension, commonly called high blood pressure, is when the force of blood pumping through the arteries is too strong. Arteries are blood vessels that carry blood from the heart throughout the body. Over time, hypertension can damage the arteries and decrease blood flow to important parts of the body, including the brain, heart, and kidneys. Often, hypertension does not cause symptoms until blood pressure is very high. For this reason, it is important to have your blood pressure checked on a regular basis. Hypertension can often be prevented with diet and lifestyle changes. If you already have hypertension, you can control it with diet and lifestyle changes, as well as medicine. What nutrition changes can be made? Maintain a healthy diet. This includes:  Eating less salt  (sodium). Ask your health care provider how much sodium is safe for you to have. The general recommendation is to consume less than 1 tsp (2,300 mg) of sodium a day. ? Do not add salt to your food. ? Choose low-sodium options when grocery shopping and eating out.  Limiting fats in your diet. You can do this by eating low-fat or fat-free dairy products and by eating less red meat.  Eating more fruits, vegetables, and whole grains. Make a goal to eat: ? 1-2 cups of fresh fruits and vegetables each day. ? 3-4 servings of whole grains each day.  Avoiding foods and beverages that have added sugars.  Eating fish that contain healthy fats (omega-3 fatty acids), such as mackerel or salmon.  If you need help putting together a healthy eating plan, try the DASH diet. This diet is high in fruits, vegetables, and whole grains. It is low in sodium, red meat, and added sugars. DASH stands for Dietary Approaches to Stop Hypertension. What lifestyle changes can be made?  Lose weight if you are overweight. Losing just 3?5% of your body weight can help prevent or control hypertension. ? For example, if your present weight is 200 lb (91 kg), a loss of 3-5% of your weight means losing 6-10 lb (2.7-4.5 kg). ? Ask your health care provider to help you with a diet and exercise plan to safely lose weight.  Get enough exercise. Do at least 150 minutes of moderate-intensity exercise each week. ? You could do this in short exercise sessions several times a day, or you could do longer exercise sessions a few times a week. For example, you could take a brisk 10-minute walk or bike ride, 3 times a day, for 5 days a week.  Find ways to reduce stress, such as exercising, meditating, listening to music, or taking a yoga class. If you need help reducing stress, ask your health care provider.  Do not smoke. This includes e-cigarettes. Chemicals in tobacco and nicotine products raise your blood pressure each time you smoke.  If you need help quitting, ask your health care provider.  Avoid alcohol. If you drink alcohol, limit alcohol intake to no more than 1 drink a day for nonpregnant women and 2 drinks a day for men. One drink equals 12 oz of beer, 5 oz of wine, or 1 oz of hard liquor. Why are these changes important? Diet and lifestyle changes can help you prevent hypertension, and they may make you feel better overall and improve your quality of life. If you have hypertension, making  these changes will help you control it and help prevent major complications, such as:  Hardening and narrowing of arteries that supply blood to: ? Your heart. This can cause a heart attack. ? Your brain. This can cause a stroke. ? Your kidneys. This can cause kidney failure.  Stress on your heart muscle, which can cause heart failure.  What can I do to lower my risk?  Work with your health care provider to make a hypertension prevention plan that works for you. Follow your plan and keep all follow-up visits as told by your health care provider.  Learn how to check your blood pressure at home. Make sure that you know your personal target blood pressure, as told by your health care provider. How is this treated? In addition to diet and lifestyle changes, your health care provider may recommend medicines to help lower your blood pressure. You may need to try a few different medicines to find what works best for you. You also may need to take more than one medicine. Take over-the-counter and prescription medicines only as told by your health care provider. Where to find support: Your health care provider can help you prevent hypertension and help you keep your blood pressure at a healthy level. Your local hospital or your community may also provide support services and prevention programs. The American Heart Association offers an online support network at: CheapBootlegs.com.cy Where to find more  information: Learn more about hypertension from:  National Heart, Lung, and Blood Institute: ElectronicHangman.is  Centers for Disease Control and Prevention: https://ingram.com/  American Academy of Family Physicians: http://familydoctor.org/familydoctor/en/diseases-conditions/high-blood-pressure.printerview.all.html  Learn more about the DASH diet from:  [place], [facility], and [place]: https://www.reyes.com/  Contact a health care provider if:  You think you are having a reaction to medicines you have taken.  You have recurrent headaches or feel dizzy.  You have swelling in your ankles.  You have trouble with your vision. Summary  Hypertension often does not cause any symptoms until blood pressure is very high. It is important to get your blood pressure checked regularly.  Diet and lifestyle changes are the most important steps in preventing hypertension.  By keeping your blood pressure in a healthy range, you can prevent complications like heart attack, heart failure, stroke, and kidney failure.  Work with your health care provider to make a hypertension prevention plan that works for you. This information is not intended to replace advice given to you by your health care provider. Make sure you discuss any questions you have with your health care provider. Document Released: 11/04/2015 Document Revised: 06/30/2016 Document Reviewed: 06/30/2016 Elsevier Interactive Patient Education  2018 [employer].   IF you received an x-ray today, you will receive an invoice from [facility]. Please contact [facility] at [phone] with questions or concerns regarding your invoice.   IF you received labwork today, you will receive an invoice from [facility]. Please contact [facility] at [phone] with questions or concerns regarding your invoice.   Our billing staff will not be able to assist  you with questions regarding bills from these companies.  You will be contacted with the lab results as soon as they are available. The fastest way to get your results is to activate your My Chart account. Instructions are located on the last page of this paperwork. If you have not heard from [place] regarding the results in 2 weeks, please contact this office.

## 2017-11-26 NOTE — Progress Notes (Signed)
Kathy Stephens  MRN: [id number] DOB: [birth year]  Subjective:  Kathy Stephens is a 57 y.o. female seen in office today for a chief complaint of fatigue x one month. Has had 5 dizzy spells. Feels clammy sometimes and has occassional shortness of breath. Has had some elevated bp readings in 919T systolically but also has had normal readings. Used to take bp medication a long time but lost a lot of weight so she stopped taking it. Followed closely by GI for bowel issues. Notes she had some blood work at their office in 10/2017 and was found to have an elevated CRP. They then performed a colonoscopy and found ulcerative colitis. Was then started on lialda about a month ago. She was worried because her GI doctor seemed really concerned about the elevated CRP and she wanted to be evaluated for cancer? . Denies fever, unexplained weight loss, cough, heart palpitations, chest pain, abdominal pain, nausea, and vomiting. Denies recent travel or recent tick exposure. No other changes in the past month besides starting lialda.  Getting about 10 hours of sleep a night. Diet consists of chicken, red meat, vegetables, pasta. Tries to drink water, does not get a lot. Drinks coffee. No multivitamin.  No structured exercise. Smokes 3-4 cigarettes a day x 15 years. No iliicit drug use.She is up to date colonoscopy. Last mammogram was in 2017. Last pap smear >4 years ago. She is postmenopausal, LMP 2008. FH of heart disease in mother and father, both have had stents placed. FH of breast cancer in mother. No FH of thyroid disease.    Review of Systems  Constitutional: Positive for activity change. Negative for appetite change, chills and diaphoresis.  HENT: Negative for congestion.   Eyes: Negative for visual disturbance.  Respiratory: Negative for cough.   Cardiovascular: Negative for leg swelling.  Gastrointestinal: Negative for abdominal pain, nausea and vomiting.  Endocrine: Negative for cold intolerance, heat  intolerance, polydipsia, polyphagia and polyuria.  Musculoskeletal: Negative for arthralgias, back pain, gait problem, joint swelling and myalgias.  Skin: Negative for rash.  Allergic/Immunologic: Negative for environmental allergies and food allergies.  Neurological: Negative for seizures, speech difficulty, weakness and numbness.  Psychiatric/Behavioral: Negative for confusion and dysphoric mood. The patient is not nervous/anxious.     Patient Active Problem List   Diagnosis Date Noted  . Right hip pain 12/11/2016  . Other and unspecified hyperlipidemia 02/03/2013    Current Outpatient Medications on File Prior to Visit  Medication Sig Dispense Refill  . acetaminophen (TYLENOL) 500 MG tablet Take 500 mg by mouth as needed.    . Alum Hydroxide-Mag Trisilicate (GAVISCON) 66-06.0 MG CHEW Chew 1 tablet by mouth 2 (two) times daily as needed (for heartburn).    . cycloSPORINE (RESTASIS) 0.05 % ophthalmic emulsion 1 drop 2 (two) times daily.    . diphenhydrAMINE (BENADRYL) 50 MG capsule Take 50 mg by mouth at bedtime as needed.    . mesalamine (LIALDA) 1.2 g EC tablet Take by mouth daily.    Marland Kitchen OMEPRAZOLE PO Take 20 mg by mouth daily.    . diclofenac (VOLTAREN) 75 MG EC tablet Take 1 tablet (75 mg total) by mouth 2 (two) times daily. (Patient not taking: Reported on 11/26/2017) 30 tablet 0  . Doxylamine Succinate, Sleep, (SLEEP AID PO) Take 1 tablet by mouth at bedtime.    Marland Kitchen escitalopram (LEXAPRO) 10 MG tablet Take 20 mg by mouth daily.    Marland Kitchen esomeprazole (NEXIUM 24HR) 20 MG capsule Take 20 mg  by mouth daily at 12 noon.    . fluconazole (DIFLUCAN) 150 MG tablet Take 1 tablet (150 mg total) by mouth once. Repeat if needed (Patient not taking: Reported on 05/31/2016) 2 tablet 0  . HYDROcodone-acetaminophen (NORCO/VICODIN) 5-325 MG per tablet Take 1 tablet by mouth every 6 (six) hours as needed for moderate pain.    . meloxicam (MOBIC) 7.5 MG tablet Take 1-2 tablets (7.5-15 mg total) by mouth daily  as needed for pain. (Patient not taking: Reported on 12/15/2016) 60 tablet 0  . promethazine-codeine (PHENERGAN WITH CODEINE) 6.25-10 MG/5ML syrup Take 5-10 mLs by mouth every 6 (six) hours as needed. (Patient not taking: Reported on 12/15/2016) 120 mL 0   No current facility-administered medications on file prior to visit.     Allergies  Allergen Reactions  . Prednisone     Mouth swelled up      Social History   Socioeconomic History  . Marital status: Married    Spouse name: Not on file  . Number of children: Not on file  . Years of education: Not on file  . Highest education level: Not on file  Social Needs  . Financial resource strain: Not on file  . Food insecurity - worry: Not on file  . Food insecurity - inability: Not on file  . Transportation needs - medical: Not on file  . Transportation needs - non-medical: Not on file  Occupational History  . Not on file  Tobacco Use  . Smoking status: Current Every Day Smoker    Packs/day: 0.80    Years: 15.00    Pack years: 12.00    Types: Cigarettes  . Smokeless tobacco: Never Used  Substance and Sexual Activity  . Alcohol use: Yes    Alcohol/week: 0.0 - 0.5 oz    Comment: socially  . Drug use: No  . Sexual activity: Yes    Partners: Male    Birth control/protection: Surgical    Comment: BTL  Other Topics Concern  . Not on file  Social History Narrative  . Not on file    Objective:  BP (!) 139/94 (BP Location: Left Arm, Patient Position: Sitting, Cuff Size: Normal)   Pulse 85   Temp 98 F (36.7 C) (Oral)   Resp 18   Ht 5' 4.33" (1.634 m)   Wt 172 lb 12.8 oz (78.4 kg)   LMP 11/03/2006   SpO2 98%   BMI 29.36 kg/m   Physical Exam  Constitutional: She is oriented to person, place, and time and well-developed, well-nourished, and in no distress.  HENT:  Head: Normocephalic and atraumatic.  Eyes: Conjunctivae are normal.  Neck: Normal range of motion.  Cardiovascular: Normal rate, regular rhythm and normal  heart sounds.  Pulmonary/Chest: Effort normal and breath sounds normal. She has no wheezes. She has no rhonchi. She has no rales.  Abdominal: Soft. Normal appearance and bowel sounds are normal. There is no tenderness.  Genitourinary: Vagina normal, uterus normal, cervix normal, right adnexa normal, left adnexa normal and vulva normal.  Musculoskeletal:       Right lower leg: She exhibits no swelling.       Left lower leg: She exhibits no swelling.  Lymphadenopathy:       Head (right side): No submental, no submandibular, no tonsillar, no preauricular, no posterior auricular and no occipital adenopathy present.       Head (left side): No submental, no submandibular, no tonsillar, no preauricular, no posterior auricular and no occipital adenopathy present.  She has no cervical adenopathy.       Right: No supraclavicular adenopathy present.       Left: No supraclavicular adenopathy present.  Neurological: She is alert and oriented to person, place, and time. Gait normal.  Skin: Skin is warm and dry.  Psychiatric: Affect normal.  Vitals reviewed.  BP Readings from Last 3 Encounters:  11/26/17 (!) 139/94  12/15/16 140/84  05/31/16 132/90    Assessment and Plan :  1. Other fatigue Labs pending. Recommend we update all health maintenance cancer screening at this time. No clear etiology at this time. Pt is well appearing, no distress. No acute findings noted on PE. Advised that this could be related to starting lialda as this was the one change she made in her life prior to noticing the fatigue. Given educational material on fatigue. Recommended she start a daily multivitamin, increase exercise and daily water consumption. Also educated pt that it would be expected for her to have an elevated CRP if she was diagnosed with ulcerative colitis, as it is an inflammatory bowel disease, she was reassured.  - CBC with Differential/Platelet - CMP14+EGFR - Lipid panel - TSH - POCT urinalysis  dipstick - VITAMIN D 25 Hydroxy (Vit-D Deficiency, Fractures)  2. Pap smear for cervical cancer screening - Pap IG and HPV (high risk) DNA detection 3. Screening for breast cancer Pt notes she will contact the imaging center she always has her mammograms at to schedule mammogram. 4. Elevated blood pressure reading Mildly elevated at 139/94. Instructed to check bp outside of office over the next couple of weeks. Return if consistently >140/90. Given strict ED precautions.   Tenna Delaine PA-C  [facility] 11/26/2017 5:36 PM

## 2017-11-27 ENCOUNTER — Ambulatory Visit: Payer: 59

## 2017-11-27 ENCOUNTER — Telehealth: Payer: Self-pay

## 2017-11-27 LAB — VITAMIN D 25 HYDROXY (VIT D DEFICIENCY, FRACTURES)

## 2017-11-27 LAB — LIPID PANEL

## 2017-11-27 LAB — CBC WITH DIFFERENTIAL/PLATELET

## 2017-11-27 LAB — TSH

## 2017-11-27 LAB — CMP14+EGFR

## 2017-11-27 NOTE — Telephone Encounter (Signed)
Called and spoke with patient informing her that she is needed to come back in for lab draw as there was an error yesterday. Patient was very nice, verbalized understanding and agreed to come in.

## 2017-11-28 LAB — CMP14+EGFR
ALT: 12 IU/L (ref 0–32)
AST: 15 IU/L (ref 0–40)
Albumin/Globulin Ratio: 1.4 (ref 1.2–2.2)
Albumin: 4.2 g/dL (ref 3.5–5.5)
Alkaline Phosphatase: 107 IU/L (ref 39–117)
BUN/Creatinine Ratio: 13 (ref 9–23)
BUN: 14 mg/dL (ref 6–24)
Bilirubin Total: 0.2 mg/dL (ref 0.0–1.2)
CO2: 25 mmol/L (ref 20–29)
Calcium: 9.7 mg/dL (ref 8.7–10.2)
Chloride: 100 mmol/L (ref 96–106)
Creatinine, Ser: 1.04 mg/dL — ABNORMAL HIGH (ref 0.57–1.00)
GFR calc Af Amer: 70 mL/min/{1.73_m2} (ref >59)
GFR calc non Af Amer: 61 mL/min/{1.73_m2} (ref >59)
Globulin, Total: 3.1 g/dL (ref 1.5–4.5)
Glucose: 103 mg/dL — ABNORMAL HIGH (ref 65–99)
Potassium: 4.5 mmol/L (ref 3.5–5.2)
Sodium: 140 mmol/L (ref 134–144)
Total Protein: 7.3 g/dL (ref 6.0–8.5)

## 2017-11-28 LAB — CBC WITH DIFFERENTIAL/PLATELET
Basophils Absolute: 0 10*3/uL (ref 0.0–0.2)
Basos: 1 %
EOS (ABSOLUTE): 0.1 10*3/uL (ref 0.0–0.4)
Eos: 2 %
Hematocrit: 43.9 % (ref 34.0–46.6)
Hemoglobin: 14.7 g/dL (ref 11.1–15.9)
Immature Grans (Abs): 0 10*3/uL (ref 0.0–0.1)
Immature Granulocytes: 0 %
Lymphocytes Absolute: 1.9 10*3/uL (ref 0.7–3.1)
Lymphs: 31 %
MCH: 30.1 pg (ref 26.6–33.0)
MCHC: 33.5 g/dL (ref 31.5–35.7)
MCV: 90 fL (ref 79–97)
Monocytes Absolute: 0.4 10*3/uL (ref 0.1–0.9)
Monocytes: 7 %
Neutrophils Absolute: 3.6 10*3/uL (ref 1.4–7.0)
Neutrophils: 59 %
Platelets: 185 10*3/uL (ref 150–379)
RBC: 4.88 x10E6/uL (ref 3.77–5.28)
RDW: 13 % (ref 12.3–15.4)
WBC: 6.1 10*3/uL (ref 3.4–10.8)

## 2017-11-28 LAB — LIPID PANEL
Chol/HDL Ratio: 4.4 ratio (ref 0.0–4.4)
Cholesterol, Total: 244 mg/dL — ABNORMAL HIGH (ref 100–199)
HDL: 55 mg/dL (ref >39)
LDL Calculated: 159 mg/dL — ABNORMAL HIGH (ref 0–99)
Triglycerides: 150 mg/dL — ABNORMAL HIGH (ref 0–149)
VLDL Cholesterol Cal: 30 mg/dL (ref 5–40)

## 2017-11-28 LAB — VITAMIN D 25 HYDROXY (VIT D DEFICIENCY, FRACTURES): Vit D, 25-Hydroxy: 17.8 ng/mL — ABNORMAL LOW (ref 30.0–100.0)

## 2017-11-28 LAB — TSH: TSH: 1.76 u[IU]/mL (ref 0.450–4.500)

## 2017-11-29 ENCOUNTER — Encounter: Payer: Self-pay | Admitting: Physician Assistant

## 2017-11-30 LAB — PAP IG AND HPV HIGH-RISK
HPV, high-risk: NEGATIVE
PAP Smear Comment: 0

## 2018-04-01 ENCOUNTER — Other Ambulatory Visit: Payer: Self-pay | Admitting: Physician Assistant

## 2018-04-01 DIAGNOSIS — Z1231 Encounter for screening mammogram for malignant neoplasm of breast: Secondary | ICD-10-CM

## 2018-04-22 ENCOUNTER — Ambulatory Visit
Admission: RE | Admit: 2018-04-22 | Discharge: 2018-04-22 | Disposition: A | Discharge status: Home / Self Care | Payer: 59 | Source: Ambulatory Visit | Attending: Physician Assistant | Admitting: Physician Assistant

## 2018-04-22 DIAGNOSIS — Z1231 Encounter for screening mammogram for malignant neoplasm of breast: Secondary | ICD-10-CM

## 2019-05-13 ENCOUNTER — Other Ambulatory Visit: Payer: Self-pay | Admitting: Physician Assistant

## 2019-05-13 DIAGNOSIS — Z1231 Encounter for screening mammogram for malignant neoplasm of breast: Secondary | ICD-10-CM

## 2019-06-27 ENCOUNTER — Other Ambulatory Visit: Payer: Self-pay

## 2019-06-27 ENCOUNTER — Ambulatory Visit
Admission: RE | Admit: 2019-06-27 | Discharge: 2019-06-27 | Disposition: A | Discharge status: Home / Self Care | Payer: 59 | Source: Ambulatory Visit | Attending: Physician Assistant | Admitting: Physician Assistant

## 2019-06-27 DIAGNOSIS — Z1231 Encounter for screening mammogram for malignant neoplasm of breast: Secondary | ICD-10-CM

## 2020-04-09 ENCOUNTER — Other Ambulatory Visit: Payer: Self-pay

## 2020-04-09 ENCOUNTER — Encounter: Payer: Self-pay | Admitting: Emergency Medicine

## 2020-04-09 ENCOUNTER — Ambulatory Visit
Admission: EM | Admit: 2020-04-09 | Discharge: 2020-04-09 | Disposition: A | Discharge status: Home / Self Care | Payer: 59

## 2020-04-09 DIAGNOSIS — Z5189 Encounter for other specified aftercare: Secondary | ICD-10-CM

## 2020-04-09 DIAGNOSIS — W57XXXA Bitten or stung by nonvenomous insect and other nonvenomous arthropods, initial encounter: Secondary | ICD-10-CM

## 2020-04-09 DIAGNOSIS — B070 Plantar wart: Secondary | ICD-10-CM

## 2020-04-09 HISTORY — DX: Ulcerative colitis, unspecified, without complications: K51.90

## 2020-04-09 MED ORDER — SALICYLIC ACID 17 % EX GEL: Freq: Every day | CUTANEOUS | 0 refills | Status: DC

## 2020-04-09 MED ORDER — TRIAMCINOLONE ACETONIDE 0.1 % EX CREA: 1.0000 "application " | TOPICAL_CREAM | Freq: Two times a day (BID) | CUTANEOUS | 0 refills | Status: DC

## 2020-04-09 NOTE — ED Triage Notes (Signed)
Cyst on right heel for 3-4 years.  Cyst is getting larger and painful.    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  Patient removed a tick on Thursday from left lower leg, inner leg.  Now there is a red, circular area to left, lower leg

## 2020-04-09 NOTE — Discharge Instructions (Addendum)
Tick bite/wound check No signs of skin infection. No signs of tick borne disease. Triamcinolone to help with itching and irritation. If triamcinolone causing skins reaction, stop and give office a call. Monitor for signs of infection including spreading redness, increased warmth, increased pain, drainage.  Monitor for signs of tickborne disease including fever, headaches, nausea/vomiting, rash throughout the body, joint/muscle pain.  Please call office if you have any of the symptoms for further evaluation.  Plantar wart Start Salicylic acid gel as directed. Donut dressing to help decrease friction/irritation. Follow up with dermatology if symptoms does not improve.

## 2020-04-09 NOTE — ED Provider Notes (Addendum)
[facility]    CSN: [id number] Arrival date & time: 04/09/20  1146      History   Chief Complaint Chief Complaint  Patient presents with  . Cyst  . Insect Bite    HPI Kathy Stephens is a 59 y.o. female.   59 year old female comes in for multiple complaints.  1. Tick bite x 4 days. Removed tick 4 days ago, unknown when tick attached. States tick was not engorged when removing. Has surrounding erythema with itching to the area. Denies pain, rashes, fever, headache, nausea/vomiting, joint/body aches.  2. Skin lesion to right heel. This has been present for 3-4 years, feels that it started getting larger the past 6 months. Area is painful with increased activity/irritation. Denies redness, warmth, drainage.      Past Medical History:  Diagnosis Date  . Allergy   . Anemia   . Anxiety   . Cancer ([place])    skin (nose & face)  . Depression   . Fibromyalgia   . GERD (gastroesophageal reflux disease)   . Inflammatory bowel disease (ulcerative colitis) ([place])   . Migraines   . Osteopenia     Patient Active Problem List   Diagnosis Date Noted  . Right hip pain 12/11/2016  . Other and unspecified hyperlipidemia 02/03/2013    Past Surgical History:  Procedure Laterality Date  . CESAREAN SECTION    . CHOLECYSTECTOMY  03/19/2012   Procedure: LAPAROSCOPIC CHOLECYSTECTOMY;  Surgeon: Gwenyth Ober, MD;  Location: [facility];  Service: General;  Laterality: N/A;  . FOOT SURGERY  1980's  . LAPAROSCOPY     with cystectomy  . LEFT OOPHORECTOMY    . tendon relief    . TUBAL LIGATION      OB History    Gravida  1   Para  1   Term  1   Preterm      AB      Living  1     SAB      TAB      Ectopic      Multiple      Live Births  1            Home Medications    Prior to Admission medications   Medication Sig Start Date End Date Taking? Authorizing Provider  Bacitracin-Polymyxin B (NEOSPORIN EX) Apply topically.   Yes [provider]   acetaminophen (TYLENOL) 500 MG tablet Take 500 mg by mouth as needed.    [provider]  Alum Hydroxide-Mag Trisilicate (GAVISCON) 49-67.5 MG CHEW Chew 1 tablet by mouth 2 (two) times daily as needed (for heartburn).    [provider]  cycloSPORINE (RESTASIS) 0.05 % ophthalmic emulsion 1 drop 2 (two) times daily.    [provider]  diphenhydrAMINE (BENADRYL) 50 MG capsule Take 50 mg by mouth at bedtime as needed.    [provider]  Doxylamine Succinate, Sleep, (SLEEP AID PO) Take 1 tablet by mouth at bedtime.    [provider]  esomeprazole (NEXIUM 24HR) 20 MG capsule Take 20 mg by mouth daily at 12 noon.    [provider]  mesalamine (LIALDA) 1.2 g EC tablet Take by mouth daily.    [provider]  OMEPRAZOLE PO Take 20 mg by mouth daily.    [provider]  salicylic acid 17 % gel Apply topically daily. 04/09/20   Tasia Catchings, Amberia Bayless V, PA-C  triamcinolone cream (KENALOG) 0.1 % Apply 1 application  topically 2 (two) times daily. 04/09/20   Ok Edwards, PA-C    Family History Family History  Problem Relation Age of Onset  . Breast cancer Mother 74  . Hypertension Mother   . Cancer Mother   . Stroke Mother   . Cancer Maternal Grandfather   . Heart disease Maternal Grandfather   . Heart disease Paternal Grandmother   . Colon cancer Paternal Grandfather   . Cancer Paternal Grandfather        testicular  . Cancer Father        associated with colon polyps  . Heart disease Father   . Hyperlipidemia Father   . Breast cancer Maternal Aunt     Social History Social History   Tobacco Use  . Smoking status: Current Every Day Smoker    Packs/day: 0.80    Years: 15.00    Pack years: 12.00    Types: Cigarettes  . Smokeless tobacco: Never Used  Substance Use Topics  . Alcohol use: Yes    Alcohol/week: 0.0 - 1.0 standard drinks    Comment: socially  . Drug use: No     Allergies   Prednisone   Review of Systems Review  of Systems  Reason unable to perform ROS: See HPI as above.     Physical Exam Triage Vital Signs ED Triage Vitals  Enc Vitals Group     BP 04/09/20 1159 139/87     Pulse Rate 04/09/20 1159 62     Resp 04/09/20 1159 18     Temp 04/09/20 1159 98.3 F (36.8 C)     Temp Source 04/09/20 1159 Oral     SpO2 04/09/20 1159 97 %     Weight --      Height --      Head Circumference --      Peak Flow --      Pain Score 04/09/20 1154 4     Pain Loc --      Pain Edu? --      Excl. in [place]? --    No data found.  Updated Vital Signs BP 139/87 (BP Location: Left Arm) Comment (BP Location): large cuff  Pulse 62   Temp 98.3 F (36.8 C) (Oral)   Resp 18   LMP 11/03/2006   SpO2 97%   Visual Acuity Right Eye Distance:   Left Eye Distance:   Bilateral Distance:    Right Eye Near:   Left Eye Near:    Bilateral Near:     Physical Exam Constitutional:      General: She is not in acute distress.    Appearance: Normal appearance. She is well-developed. She is not toxic-appearing or diaphoretic.  HENT:     Head: Normocephalic and atraumatic.  Eyes:     Conjunctiva/sclera: Conjunctivae normal.     Pupils: Pupils are equal, round, and reactive to light.  Pulmonary:     Effort: Pulmonary effort is normal. No respiratory distress.     Comments: Speaking in full sentences without difficulty Musculoskeletal:     Cervical back: Normal range of motion and neck supple.  Skin:    General: Skin is warm and dry.     Comments: Pinpoint scabbing with surrounding erythema to LLE. No warmth, induration, tenderness to palpation. No active drainage.  No obvious rash to visible skin.  Right heel wart without erythema, warmth. Slight tenderness to palpation.  Neurological:     Mental Status: She is alert and oriented to person, place,  and time.      UC Treatments / Results  Labs (all labs ordered are listed, but only abnormal results are displayed) Labs Reviewed - No data to display  EKG    Radiology No results found.  Procedures Procedures (including critical care time)  Medications Ordered in UC Medications - No data to display  Initial Impression / Assessment and Plan / UC Course  I have reviewed the triage vital signs and the nursing notes.  Pertinent labs & imaging results that were available during my care of the patient were reviewed by me and considered in my medical decision making (see chart for details).    1. Tick bite/wound check No signs of bacterial infection. No symptoms of tick borne disease for now. Discussed to monitor and treat for current symptoms. Patient with history of oral swelling with PO prednisone. However, has tolerated corticosteroid eye drops. Discussed trial of topical triamcinolone, patient agreeable with understanding to discontinue if having reactions and contact office. Patient to monitor closely for tick borne disease symptoms. Other return precautions given.   If patient with symptoms consistent with cellulitis or tick borne disease, okay to call in doxycycline 100mg  BID x 7 days.  2. Plantar wart of right foot. Will start salicylic acid gel as directed. To follow up with dermatology for removal.  Patient expresses understanding and agrees to plan.  Final Clinical Impressions(s) / UC Diagnoses   Final diagnoses:  Visit for wound check  Tick bite, initial encounter  Plantar wart of right foot    ED Prescriptions    Medication Sig Dispense Auth. Provider   salicylic acid 17 % gel Apply topically daily. 15 g Harel Repetto V, PA-C   triamcinolone cream (KENALOG) 0.1 % Apply 1 application topically 2 (two) times daily. 30 g Ok Edwards, PA-C     PDMP not reviewed this encounter.   Ok Edwards, PA-C 04/09/20 1335    Ok Edwards, PA-C 04/09/20 1338

## 2020-06-26 ENCOUNTER — Other Ambulatory Visit: Payer: Self-pay

## 2020-06-26 ENCOUNTER — Ambulatory Visit: Payer: 59 | Admitting: Podiatrist

## 2020-06-26 DIAGNOSIS — L851 Acquired keratosis [keratoderma] palmaris et plantaris: Secondary | ICD-10-CM | POA: Diagnosis not present

## 2020-06-26 DIAGNOSIS — L84 Corns and callosities: Secondary | ICD-10-CM

## 2020-06-26 NOTE — Patient Instructions (Signed)
AFTERCARE INSTRUCTIONS FOR CANTHARIDIN (WART) TREATMENT:  1:  Leave the tape on the affected area for 24 hours after treatment-  do not get the foot wet. ** if you experience any pain, burning, or discomfort wash the area sooner than 24 hours.   2. After 24 hours- remove the tape and wash the wart with soap and water  3.  Expect blistering the day after treatment-  the blister may enlarge and fill with fluid-  if this occurs you may make a small hole in the blister using a sterile needle (can also dip a pin or sewing needle in rubbing alcohol and wipe dry) and squeeze fluid out by gently squeezing blister with clean hands. You may also apply a cool compress and take oral pain medications (tylenol or advil) for pain.  4.  Do not remove the top of the blister.  Apply vaseline twice daily for 5-10 days or until healing occurs.  A crusty lesion will occur and this will fall off on its own after 4-5 days.  5.  After skin healing has occurred after cantharidin treatment, apply   over the counter salicylic acid 17% (compound W or similar)      to the base of the lesion once a day for 5-7 days as tolerated to prevent wart recurrences around the treatment edges.    6.  The treated area may look red or may itch.  Call the office if there is still significant burning, stinging or pain after the blister is popped.   7.  It is rare, but anyone can have a severe allergic reaction to any medication-  call 911 if you have a severe reaction (trouble breathing, hives, trouble swallowing) after treatment.   

## 2020-06-28 NOTE — Progress Notes (Signed)
Chief Complaint  Patient presents with  . Plantar Warts    R foot, back of heel. x3 years. Pt stated, "It didn't bother me until February 2021. I went to urgent care for a tick bite in July, and I showed the doctor my heel. She told me that it was a plantar's wart and recommended salicylic acid treatments. I did two of those, but they didn't help. Pain = 2-4/10".     HPI: Patient is 59 y.o. female who presents today for the concerns as listed above.   Patient Active Problem List   Diagnosis Date Noted  . Right hip pain 12/11/2016  . Other and unspecified hyperlipidemia 02/03/2013    Current Outpatient Medications on File Prior to Visit  Medication Sig Dispense Refill  . acetaminophen (TYLENOL) 500 MG tablet Take 500 mg by mouth as needed.    . Alum Hydroxide-Mag Trisilicate (GAVISCON) 37-16.9 MG CHEW Chew 1 tablet by mouth 2 (two) times daily as needed (for heartburn).    . Bacitracin-Polymyxin B (NEOSPORIN EX) Apply topically.    . cycloSPORINE (RESTASIS) 0.05 % ophthalmic emulsion 1 drop 2 (two) times daily.    . diphenhydrAMINE (BENADRYL) 50 MG capsule Take 50 mg by mouth at bedtime as needed.    . Doxylamine Succinate, Sleep, (SLEEP AID PO) Take 1 tablet by mouth at bedtime.    Marland Kitchen esomeprazole (NEXIUM 24HR) 20 MG capsule Take 20 mg by mouth daily at 12 noon.    . mesalamine (LIALDA) 1.2 g EC tablet Take by mouth daily.    Marland Kitchen OMEPRAZOLE PO Take 20 mg by mouth daily.    . salicylic acid 17 % gel Apply topically daily. 15 g 0  . triamcinolone cream (KENALOG) 0.1 % Apply 1 application topically 2 (two) times daily. 30 g 0   No current facility-administered medications on file prior to visit.    Allergies  Allergen Reactions  . Prednisone     Mouth swelled up    Review of Systems No fevers, chills, nausea, muscle aches, no difficulty breathing, no calf pain, no chest pain or shortness of breath.   Physical Exam  GENERAL APPEARANCE: Alert, conversant. Appropriately groomed.  No acute distress.   VASCULAR: Pedal pulses palpable DP and PT bilateral.  Capillary refill time is immediate to all digits,  Proximal to distal cooling it warm to warm.  Digital perfusion adequate.   NEUROLOGIC: sensation is intact epicritically and protectively to 5.07 monofilament at 5/5 sites bilateral.  Light touch is intact bilateral, vibratory sensation intact bilateral, achilles tendon reflex is intact bilateral.   MUSCULOSKELETAL: acceptable muscle strength, tone and stability bilateral.  No gross boney pedal deformities noted.  No pain, crepitus or limitation noted with foot and ankle range of motion bilateral.   DERMATOLOGIC: skin is warm, supple, and dry. Discreet hyperkeratotic/ porokeratotic lesion is present on the posterior center heel.  Skin tension lines are present and no pinpoint bleeding encountered with debridement.       Assessment     ICD-10-CM   1. Callus of foot  L84   2. 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  I applied salinocaine and tape.  Recommended at home use of a pumice stone and callus remover if the lesion returns.  She will be seen as needed or if the area fails to resolve.

## 2020-07-31 ENCOUNTER — Other Ambulatory Visit: Payer: Self-pay | Admitting: Gastroenterology

## 2020-07-31 DIAGNOSIS — Z1231 Encounter for screening mammogram for malignant neoplasm of breast: Secondary | ICD-10-CM

## 2020-08-02 ENCOUNTER — Ambulatory Visit
Admission: RE | Admit: 2020-08-02 | Discharge: 2020-08-02 | Disposition: A | Discharge status: Home / Self Care | Payer: 59 | Source: Ambulatory Visit

## 2020-08-02 ENCOUNTER — Other Ambulatory Visit: Payer: Self-pay

## 2020-08-02 DIAGNOSIS — Z1231 Encounter for screening mammogram for malignant neoplasm of breast: Secondary | ICD-10-CM

## 2020-09-25 ENCOUNTER — Ambulatory Visit: Payer: 59 | Admitting: Podiatrist

## 2020-09-25 ENCOUNTER — Encounter: Payer: Self-pay | Admitting: Podiatrist

## 2020-09-25 ENCOUNTER — Other Ambulatory Visit: Payer: Self-pay

## 2020-09-25 DIAGNOSIS — M79674 Pain in right toe(s): Secondary | ICD-10-CM

## 2020-09-25 DIAGNOSIS — L600 Ingrowing nail: Secondary | ICD-10-CM | POA: Diagnosis not present

## 2020-09-25 NOTE — Progress Notes (Signed)
   Chief Complaint  Patient presents with  . Ingrown Toenail    pt states she has an ingrown right great toe     HPI: Patient is 59 y.o. female who presents today for discomfort on the lateral side of the right great toe for a long time.  She states it gets swollen at times and becomes painful. She states she has tried to cut a notch in the toenail which temporarily helps.   She also reports pain in the right third toe.  She states she was walking and felt a sharp pain and she took her shoe off immediately to see if there was a bite or glass, etc in her toe.  Her husband looked at it and found nothing.  Since this incident, the toe has been painful.    Allergies  Allergen Reactions  . Prednisone     Mouth swelled up    Review of systems is reviewed and negative.   Physical Exam  Patient is awake, alert, and oriented x 3.  In no acute distress.    Vascular status is intact with palpable pedal pulses DP and PT bilateral and capillary refill time less than 3 seconds bilateral.  No edema or erythema noted.  Neurological exam reveals epicritic and protective sensation grossly intact bilateral.  Dermatological exam reveals skin is supple and dry to bilateral feet.  No open lesions present.  Small callus present posterior heel.   Musculoskeletal exam: Musculature intact with dorsiflexion, plantarflexion, inversion, eversion. Ankle and First MPJ joint range of motion normal.  Normal appearance of the right third toe is noted.  Negative lachmans test noted.  No pain with dorsiflexion or plantarflexion at the mp joint.  No sign of foreign body present.   Right hallux nail lateral side is puffy and painful when pressure is applied. The nail appears to be wide for the underlying nail bed on this corner.  No sign of infection or drainage is noted.    Assessment:   ICD-10-CM   1. Ingrowing nail  L60.0   2. Pain of toe of right foot  M79.674      Plan: Treatment options and alternatives were  discussed.  -for the third toe, I recommended voltaren gel to the toe daily until symptoms resolve. -for the Ingrown hallux nail- I recommended a permanent removal of the lateral nail border of the right hallux.  Patient agreed. Skin was prepped with alcohol and a local injection of lidocaine and Marcaine plain was infiltrated to anesthetize the toe. The toe was then prepped with Betadine exsanguinated. The offending nail border is removed and phenol applied.  It was cleansed well with alcohol. Antibiotic ointment and a dressing was then applied and the patient was given instructions for aftercare.  -She will return in a week for nail check and will call sooner if questions or concerns arise.

## 2020-09-25 NOTE — Patient Instructions (Signed)

## 2020-10-01 ENCOUNTER — Encounter: Payer: Self-pay | Admitting: Podiatrist

## 2020-10-01 ENCOUNTER — Other Ambulatory Visit: Payer: Self-pay

## 2020-10-01 ENCOUNTER — Ambulatory Visit: Payer: 59 | Admitting: Podiatrist

## 2020-10-01 DIAGNOSIS — Z09 Encounter for follow-up examination after completed treatment for conditions other than malignant neoplasm: Secondary | ICD-10-CM

## 2020-10-01 DIAGNOSIS — L600 Ingrowing nail: Secondary | ICD-10-CM

## 2020-10-01 NOTE — Progress Notes (Addendum)
Chief Complaint  Patient presents with  . Follow-up     1 week nail check. pt states that nail is doing fine.      HPI: Patient is 59 y.o. female who presents today for 1 week check of the ingrown toenail on the right great toenail medial aspect.  She states it was sore for the first several days after the procedure but now it is improving.  She denies any nausea vomiting fevers chills night sweats.  Allergies  Allergen Reactions  . Prednisone     Mouth swelled up    Review of systems is reviewed and negative.   Physical Exam  Patient is awake, alert, and oriented x 3.  In no acute distress.    Vascular status is intact with palpable pedal pulses DP and PT bilateral and capillary refill time less than 3 seconds bilateral.  No edema or erythema noted.  Neurological exam reveals epicritic and protective sensation grossly intact bilateral.  Dermatological exam reveals skin is supple and dry to bilateral feet.  No open lesions present.   Musculoskeletal exam: Musculature intact with dorsiflexion, plantarflexion, inversion, eversion. Ankle and First MPJ joint range of motion normal.   Right  hallux nail lateral border appear to be healing well.  No active drainage noted, no pus or purulence seen.  Slight redness peri border which is normal post ingrown procedure   assessment: That is post removal ingrown toenail right hallux lateral border.   Plan: Recommended for the patient to continue keeping the toe clean and covering while its healing.  She will continue to use the Neosporin and a dressing.  If she notices any increase in redness, swelling, drainage, or concerns she is to call immediately.  Otherwise she will be seen back as needed.

## 2021-03-10 DIAGNOSIS — R079 Chest pain, unspecified: Secondary | ICD-10-CM | POA: Insufficient documentation

## 2021-03-10 DIAGNOSIS — R0789 Other chest pain: Secondary | ICD-10-CM | POA: Insufficient documentation

## 2021-03-10 DIAGNOSIS — Z8249 Family history of ischemic heart disease and other diseases of the circulatory system: Secondary | ICD-10-CM | POA: Insufficient documentation

## 2021-03-10 NOTE — Progress Notes (Signed)
Patient referred by Jeani Hawking, MD for chest pressure  Subjective:   Kathy Stephens, female    DOB: Jul 30, 1961, 60 y.o.   MRN: [id number]   Chief Complaint  Patient presents with  . Chest Pain  . family history CAD  . New Patient (Initial Visit)    HPI  60 y.o. Caucasian female with h/o skin cancer, ulcerative colitis, anxiety, depresson, fibromyalgia, family history of CAD, referred for evaluation of chest pressure  Patient is retired, lives fairly sedentary lifestyle.  She walks with her dog or 2 blocks couple times a week.  She has noticed chest pressure radiating to both shoulder blades, associated with physical activity such as cleaning in the house.  She also has exertional dyspnea with this level of activity.  She denies orthopnea, PND, leg edema symptoms.  Blood pressure elevated today.  At baseline, it is reportedly normal.  Patient was on antihypertensive medications in the past when she weighed 40 pounds heavier.  Since she lost weight, she was taken off antihypertensive therapy.  She did have history of what sounds like orthostatic hypotension in the past.  Currently, she denies any presyncope or syncope symptoms.  She smokes 3-4 cigars every day.  She is willing to quit, but is not ready to quit yet.  Patient's father had first MI in 70s, has had CABG and PCI since then, also has pacemaker.  She also has a little members with history of CAD, A. fib.   Past Medical History:  Diagnosis Date  . Allergy   . Anemia   . Anxiety   . Cancer ([facility])    skin (nose & face)  . Depression   . Fibromyalgia   . GERD (gastroesophageal reflux disease)   . Inflammatory bowel disease (ulcerative colitis) ([facility])   . Migraines   . Osteopenia      Past Surgical History:  Procedure Laterality Date  . CESAREAN SECTION    . CHOLECYSTECTOMY  03/19/2012   Procedure: LAPAROSCOPIC CHOLECYSTECTOMY;  Surgeon: Cherylynn Ridges, MD;  Location: [facility] OR;  Service: General;  Laterality: N/A;  .  FOOT SURGERY  1980's  . LAPAROSCOPY     with cystectomy  . LEFT OOPHORECTOMY    . tendon relief    . TUBAL LIGATION       Social History   Tobacco Use  Smoking Status Current Every Day Smoker  . Packs/day: 0.80  . Years: 15.00  . Pack years: 12.00  . Types: Cigarettes  Smokeless Tobacco Never Used    Social History   Substance and Sexual Activity  Alcohol Use Yes  . Alcohol/week: 0.0 - 1.0 standard drinks   Comment: socially     Family History  Problem Relation Age of Onset  . Breast cancer Mother 45  . Hypertension Mother   . Cancer Mother   . Stroke Mother   . Cancer Maternal Grandfather   . Heart disease Maternal Grandfather   . Heart disease Paternal Grandmother   . Colon cancer Paternal Grandfather   . Cancer Paternal Grandfather        testicular  . Cancer Father        associated with colon polyps  . Heart disease Father   . Hyperlipidemia Father   . Breast cancer Maternal Aunt      Current Outpatient Medications on File Prior to Visit  Medication Sig Dispense Refill  . acetaminophen (TYLENOL) 500 MG tablet Take 500 mg by mouth as needed.    Marland Kitchen  Alum Hydroxide-Mag Trisilicate (GAVISCON) 80-14.2 MG CHEW Chew 1 tablet by mouth 2 (two) times daily as needed (for heartburn).    . APRISO 0.375 g 24 hr capsule Take 1.5 g by mouth daily.    . cycloSPORINE (RESTASIS) 0.05 % ophthalmic emulsion 1 drop 2 (two) times daily.    . diphenhydrAMINE (BENADRYL) 50 MG capsule Take 50 mg by mouth at bedtime as needed.    . Doxylamine Succinate, Sleep, (SLEEP AID PO) Take 1 tablet by mouth at bedtime.    Marland Kitchen esomeprazole (NEXIUM 24HR) 20 MG capsule Take 20 mg by mouth daily at 12 noon.    Marland Kitchen OMEPRAZOLE PO Take 20 mg by mouth daily.     No current facility-administered medications on file prior to visit.    Cardiovascular and other pertinent studies:  EKG 03/11/2021:  Sinus rhythm 66 bpm  RSR(V1) -nondiagnostic   Recent labs: 02/14/2021: Glucose 97, BUN/Cr 24/0.97.  EGFR 68. Na/K 141/4.3. AlKP 131. Rest of the CMP normal H/H 15/46. MCV 91. Platelets 184 HbA1C N/A Lipid panel N/A TSH N/A   Review of Systems  Cardiovascular: Positive for chest pain and dyspnea on exertion. Negative for leg swelling, palpitations and syncope.         Vitals:   03/11/21 0946  BP: (!) 154/77  Pulse: 73  Resp: 16  Temp: (!) 97.2 F (36.2 C)  SpO2: 97%     Body mass index is 31.41 kg/m. Filed Weights   03/11/21 0946  Weight: 183 lb (83 kg)     Objective:   Physical Exam Vitals and nursing note reviewed.  Constitutional:      General: She is not in acute distress. Neck:     Vascular: No JVD.  Cardiovascular:     Rate and Rhythm: Normal rate and regular rhythm.     Heart sounds: Normal heart sounds. No murmur heard.   Pulmonary:     Effort: Pulmonary effort is normal.     Breath sounds: Normal breath sounds. No wheezing or rales.  Musculoskeletal:     Right lower leg: No edema.     Left lower leg: No edema.         Assessment & Recommendations:   60 y.o. Caucasian female with h/o skin cancer, ulcerative colitis, anxiety, depresson, fibromyalgia, referred for evaluation of chest pressure  Chest pressure, exertional dyspnea: Concerning for angina symptoms.  Risk factors include family history of CAD, personal history of tobacco dependence. Recommend exercise nuclear stress test, echocardiogram, CT cardiac scoring. Prescribed sublingual nitroglycerin for as needed use. Check lipid panel. Given patient's history of ulcerative colitis, I am not starting aspirin as yet.  We will make recommendations after above testing.  Elevated blood pressure without diagnosis of hypertension: Encourage low-salt diet, regular home checks.  If remains elevated, may need antihypertensive therapy.  Nicotine dependence: Tobacco cessation counseling: - Currently smoking 3-4 cigars/day   - Patient was informed of the dangers of tobacco abuse including  stroke, cancer, and MI, as well as benefits of tobacco cessation. - Patient is willing to quit, but not ready to quit yet.   - Approximately 5 mins were spent counseling patient cessation techniques. We discussed various methods to help quit smoking, including deciding on a date to quit, joining a support group, pharmacological agents.  - I will reassess her progress at the next follow-up visit    Thank you for referring the patient to [place]. Please feel free to contact with any questions.   Gayle Collard J  Rosemary Holms, MD Pager: [phone] Office: [phone]

## 2021-03-11 ENCOUNTER — Encounter: Payer: Self-pay | Admitting: Cardiology

## 2021-03-11 ENCOUNTER — Other Ambulatory Visit: Payer: Self-pay

## 2021-03-11 ENCOUNTER — Ambulatory Visit: Payer: 59 | Admitting: Cardiology

## 2021-03-11 VITALS — BP 154/77 | HR 73 | Temp 97.2°F | Resp 16 | Ht 64.0 in | Wt 183.0 lb

## 2021-03-11 DIAGNOSIS — R0789 Other chest pain: Secondary | ICD-10-CM

## 2021-03-11 DIAGNOSIS — Z8249 Family history of ischemic heart disease and other diseases of the circulatory system: Secondary | ICD-10-CM

## 2021-03-11 DIAGNOSIS — F17210 Nicotine dependence, cigarettes, uncomplicated: Secondary | ICD-10-CM

## 2021-03-11 MED ORDER — NITROGLYCERIN 0.4 MG SL SUBL: 0.4000 mg | SUBLINGUAL_TABLET | SUBLINGUAL | 1 refills | Status: DC | PRN

## 2021-03-20 ENCOUNTER — Other Ambulatory Visit: Payer: Self-pay

## 2021-03-20 ENCOUNTER — Ambulatory Visit: Payer: 59

## 2021-03-20 DIAGNOSIS — R0789 Other chest pain: Secondary | ICD-10-CM

## 2021-03-20 DIAGNOSIS — Z8249 Family history of ischemic heart disease and other diseases of the circulatory system: Secondary | ICD-10-CM

## 2021-03-25 ENCOUNTER — Ambulatory Visit: Payer: 59

## 2021-03-25 ENCOUNTER — Other Ambulatory Visit: Payer: Self-pay

## 2021-03-25 DIAGNOSIS — R0789 Other chest pain: Secondary | ICD-10-CM

## 2021-03-25 DIAGNOSIS — Z8249 Family history of ischemic heart disease and other diseases of the circulatory system: Secondary | ICD-10-CM

## 2021-03-30 LAB — LIPID PANEL
Chol/HDL Ratio: 4.6 ratio — ABNORMAL HIGH (ref 0.0–4.4)
Cholesterol, Total: 259 mg/dL — ABNORMAL HIGH (ref 100–199)
HDL: 56 mg/dL (ref >39)
LDL Chol Calc (NIH): 169 mg/dL — ABNORMAL HIGH (ref 0–99)
Triglycerides: 185 mg/dL — ABNORMAL HIGH (ref 0–149)
VLDL Cholesterol Cal: 34 mg/dL (ref 5–40)

## 2021-04-08 ENCOUNTER — Ambulatory Visit
Admission: RE | Admit: 2021-04-08 | Discharge: 2021-04-08 | Disposition: A | Discharge status: Home / Self Care | Payer: No Typology Code available for payment source | Source: Ambulatory Visit | Attending: Cardiology | Admitting: Cardiology

## 2021-04-08 DIAGNOSIS — Z8249 Family history of ischemic heart disease and other diseases of the circulatory system: Secondary | ICD-10-CM

## 2021-04-08 DIAGNOSIS — R0789 Other chest pain: Secondary | ICD-10-CM

## 2021-04-09 ENCOUNTER — Other Ambulatory Visit: Payer: Self-pay

## 2021-04-09 ENCOUNTER — Ambulatory Visit (INDEPENDENT_AMBULATORY_CARE_PROVIDER_SITE_OTHER): Payer: 59 | Admitting: Nurse Practitioner

## 2021-04-09 ENCOUNTER — Encounter: Payer: Self-pay | Admitting: Nurse Practitioner

## 2021-04-09 VITALS — BP 138/82 | HR 56 | Temp 98.2°F | Ht 63.0 in | Wt 183.2 lb

## 2021-04-09 DIAGNOSIS — K219 Gastro-esophageal reflux disease without esophagitis: Secondary | ICD-10-CM | POA: Insufficient documentation

## 2021-04-09 DIAGNOSIS — Z0000 Encounter for general adult medical examination without abnormal findings: Secondary | ICD-10-CM | POA: Insufficient documentation

## 2021-04-09 DIAGNOSIS — I10 Essential (primary) hypertension: Secondary | ICD-10-CM | POA: Insufficient documentation

## 2021-04-09 DIAGNOSIS — E6609 Other obesity due to excess calories: Secondary | ICD-10-CM | POA: Insufficient documentation

## 2021-04-09 DIAGNOSIS — R1033 Periumbilical pain: Secondary | ICD-10-CM | POA: Insufficient documentation

## 2021-04-09 DIAGNOSIS — R1084 Generalized abdominal pain: Secondary | ICD-10-CM | POA: Insufficient documentation

## 2021-04-09 DIAGNOSIS — R142 Eructation: Secondary | ICD-10-CM | POA: Insufficient documentation

## 2021-04-09 DIAGNOSIS — R141 Gas pain: Secondary | ICD-10-CM | POA: Insufficient documentation

## 2021-04-09 DIAGNOSIS — Z1211 Encounter for screening for malignant neoplasm of colon: Secondary | ICD-10-CM | POA: Insufficient documentation

## 2021-04-09 DIAGNOSIS — Z7689 Persons encountering health services in other specified circumstances: Secondary | ICD-10-CM | POA: Insufficient documentation

## 2021-04-09 DIAGNOSIS — I208 Other forms of angina pectoris: Secondary | ICD-10-CM

## 2021-04-09 DIAGNOSIS — M797 Fibromyalgia: Secondary | ICD-10-CM | POA: Diagnosis not present

## 2021-04-09 DIAGNOSIS — E559 Vitamin D deficiency, unspecified: Secondary | ICD-10-CM | POA: Insufficient documentation

## 2021-04-09 DIAGNOSIS — Z1231 Encounter for screening mammogram for malignant neoplasm of breast: Secondary | ICD-10-CM

## 2021-04-09 DIAGNOSIS — M940 Chondrocostal junction syndrome [Tietze]: Secondary | ICD-10-CM | POA: Insufficient documentation

## 2021-04-09 DIAGNOSIS — K529 Noninfective gastroenteritis and colitis, unspecified: Secondary | ICD-10-CM | POA: Insufficient documentation

## 2021-04-09 DIAGNOSIS — R197 Diarrhea, unspecified: Secondary | ICD-10-CM | POA: Insufficient documentation

## 2021-04-09 DIAGNOSIS — R110 Nausea: Secondary | ICD-10-CM | POA: Insufficient documentation

## 2021-04-09 HISTORY — DX: Fibromyalgia: M79.7

## 2021-04-09 HISTORY — DX: Essential (primary) hypertension: I10

## 2021-04-09 MED ORDER — LOSARTAN POTASSIUM 25 MG PO TABS: 25.0000 mg | ORAL_TABLET | Freq: Every day | ORAL | 2 refills | Status: DC

## 2021-04-09 NOTE — Patient Instructions (Signed)
Preventing Hypertension Hypertension, also called high blood pressure, is when the force of blood pumping through the arteries is too strong. Arteries are blood vessels that carry blood from the heart throughout the body. Often, hypertension does not cause symptoms until blood pressure is very high. It is important to have your blood pressure checked regularly. Diet and lifestyle changes can help you prevent hypertension, and they may make you feel better overall and improve your quality of life. If you already have hypertension, you may control it with diet and lifestyle changes, as well as with medicine. How can this condition affect me? Over time, hypertension can damage the arteries and decrease blood flow to important parts of the body, including the brain, heart, and kidneys. By keeping your blood pressure in a healthy range, you can help prevent complications like heart attack, heart failure, stroke, kidney failure, and vascular dementia. What can increase my risk?  Being an older adult. Older people are more often affected.  Having family members who have had high blood pressure.  Being obese.  Being female. Males are more likely to have high blood pressure.  Drinking too much alcohol or caffeine.  Smoking or using illegal drugs.  Taking certain medicines, such as antidepressants, decongestants, birth control pills, and NSAIDs, such as ibuprofen.  Having thyroid problems.  Having certain tumors. What actions can I take to prevent or manage this condition? Work with your health care provider to make a hypertension prevention plan that works for you. Follow your plan and keep all follow-up visits as told by your health care provider. Diet changes Maintain a healthy diet. This includes:  Eating less salt (sodium). Ask your health care provider how much sodium is safe for you to have. The general recommendation is to have less than 1 tsp (2,300 mg) of sodium a day. ? Do not add salt  to your food. ? Choose low-sodium options when grocery shopping and eating out.  Limiting fats in your diet. You can do this by eating low-fat or fat-free dairy products and by eating less red meat.  Eating more fruits, vegetables, and whole grains. Make a goal to eat: ? 1-2 cups of fresh fruits and vegetables each day. ? 3-4 servings of whole grains each day.  Avoiding foods and beverages that have added sugars.  Eating fish that contain healthy fats (omega-3 fatty acids), such as mackerel or salmon. If you need help putting together a healthy eating plan, try the DASH diet. This diet is high in fruits, vegetables, and whole grains. It is low in sodium, red meat, and added sugars. DASH stands for Dietary Approaches to Stop Hypertension.   Lifestyle changes Lose weight if you are overweight. Losing just 3?5% of your body weight can help prevent or control hypertension. For example, if your present weight is 200 lb (91 kg), a loss of 3-5% of your weight means losing 6-10 lb (2.7-4.5 kg). Ask your health care provider to help you with a diet and exercise plan to safely lose weight. Other recommendations usually include:  Get enough exercise. Do at least 150 minutes of moderate-intensity exercise each week. You could do this in short exercise sessions several times a day, or you could do longer exercise sessions a few times a week. For example, you could take a brisk 10-minute walk or bike ride, 3 times a day, for 5 days a week.  Find ways to reduce stress, such as exercising, meditating, listening to music, or taking a yoga class.  If you need help reducing stress, ask your health care provider.  Do not use any products that contain nicotine or tobacco, such as cigarettes, e-cigarettes, and chewing tobacco. If you need help quitting, ask your health care provider. Chemicals in tobacco and nicotine products raise your blood pressure each time you use them. If you need help quitting, ask your health  care provider.  Learn how to check your blood pressure at home. Make sure that you know your personal target blood pressure, as told by your health care provider.  Try to sleep 7-9 hours per night.   Alcohol use  Do not drink alcohol if: ? Your health care provider tells you not to drink. ? You are pregnant, may be pregnant, or are planning to become pregnant.  If you drink alcohol: ? Limit how much you use to:  0-1 drink a day for women.  0-2 drinks a day for men. ? Be aware of how much alcohol is in your drink. In the [place] one drink equals one 12 oz bottle of beer (355 mL), one 5 oz glass of wine (148 mL), or one 1 oz glass of hard liquor (44 mL). Medicines In addition to diet and lifestyle changes, your health care provider may recommend medicines to help lower your blood pressure. In general:  You may need to try a few different medicines to find what works best for you.  You may need to take more than one medicine.  Take over-the-counter and prescription medicines only as told by your health care provider. Questions to ask your health care provider  What is my blood pressure goal?  How can I lower my risk for high blood pressure?  How should I monitor my blood pressure at home? Where to find support Your health care provider can help you prevent hypertension and help you keep your blood pressure at a healthy level. Your local hospital or your community may also provide support services and prevention programs. The American Heart Association offers an online support network at supportnetwork.heart.org Where to find more information Learn more about hypertension from:  [place], Lung, and [place]: https://wilson-eaton.com/  Centers for Disease Control and Prevention: http://www.wolf.info/  American Academy of Family Physicians: familydoctor.org Learn more about the DASH diet from:  [place], Lung, and [place]: https://wilson-eaton.com/ Contact a health care  provider if:  You think you are having a reaction to medicines you have taken.  You have recurrent headaches or feel dizzy.  You have swelling in your ankles.  You have trouble with your vision. Get help right away if:  You have sudden, severe chest, back, or abdominal pain or discomfort.  You have shortness of breath.  You have a sudden, severe headache. These symptoms may represent a serious problem that is an emergency. Do not wait to see if the symptoms will go away. Get medical help right away. Call your local emergency services (911 in the [place] Do not drive yourself to the hospital.  Summary  Hypertension often does not cause any symptoms until blood pressure is very high. It is important to get your blood pressure checked regularly.  Diet and lifestyle changes are important steps in preventing hypertension.  By keeping your blood pressure in a healthy range, you may prevent complications like heart attack, heart failure, stroke, and kidney failure.  Work with your health care provider to make a hypertension prevention plan that works for you. This information is not intended to replace advice given to  you by your health care provider. Make sure you discuss any questions you have with your health care provider. Document Revised: 09/20/2019 Document Reviewed: 09/20/2019 Elsevier Patient Education  2021 [employer].

## 2021-04-09 NOTE — Progress Notes (Signed)
New Patient Office Visit  Subjective:  Patient ID: Kathy Stephens, female    DOB: [birth year]  Age: 60 y.o. MRN: [id number]  CC:  Chief Complaint  Patient presents with  . New Patient (Initial Visit)    HPI Kathy Stephens presents to establish new primary care provider. She states that she has history of fibromyalgia. States that she frequently has episodes of "not feeling well." when this happens, she generally tries to wait it out because symptoms will generally get better with time. She states that several weeks ago, she started having some epigastric pain. States that it did not go away in reasonable amount of time. She did see her GI provider who ruled out anything GI related. Was referred to cardiology. She states that, so far, all testing has been ol. She did have elevated lipid panel. She states that this lipid panel is mostly unchanged from prior checks. She was not started on anything to help lower cholesterol.   Lipid Panel     Component Value Date/Time   CHOL 259 (H) 03/29/2021 0743   TRIG 185 (H) 03/29/2021 0743   HDL 56 03/29/2021 0743   CHOLHDL 4.6 (H) 03/29/2021 0743   CHOLHDL 5.2 02/03/2013 1224   VLDL 31 02/03/2013 1224   LDLCALC 169 (H) 03/29/2021 0743   LABVLDL 34 03/29/2021 0743   She states she would rather not as common side effects of lipid lowering medications include increased joint and muscle pain. States that she had been consuming a modified Keto diet which was high in fat content and that may have contributed to the elevation of her lipid panel. She has already started making some dietary changes to lower her lipid panel without adding medication. She states that the cardiologist did give her a prescription for nitroglycerin. She states that she carries it with her at all times but has never used it.  Blood pressure is elevated today. She states that it has been on most of her recent provider visits. She states that in the past, she has been on blood  pressure medication. States that at the time she weighed over 200 pounds. She was able to lose weight. Went dow to around 170 pounds. Was able to come off blood pressure medication. She states that she has gained some of that weight back. She is not opposed to starting on new blood pressure medication. She is due to have routine labs, but does not need redraw of her lipid panel. She is due to have her screening mammogram. She does have a strong family history of breast cancer. States that her mother passed away from metastatic breast cancer.  The patient is also due to have routine well woman exam and pap smear.   Past Medical History:  Diagnosis Date  . Allergy   . Anemia   . Anxiety   . Cancer ([place])    skin (nose & face)  . Depression   . Fibromyalgia   . Fibromyalgia 04/09/2021  . GERD (gastroesophageal reflux disease)   . Inflammatory bowel disease (ulcerative colitis) ([place])   . Migraines   . Osteopenia     Past Surgical History:  Procedure Laterality Date  . CESAREAN SECTION    . CHOLECYSTECTOMY  03/19/2012   Procedure: LAPAROSCOPIC CHOLECYSTECTOMY;  Surgeon: Gwenyth Ober, MD;  Location: [facility];  Service: General;  Laterality: N/A;  . FOOT SURGERY  1980's  . LAPAROSCOPY     with cystectomy  . LEFT OOPHORECTOMY    .  tendon relief    . TUBAL LIGATION      Family History  Problem Relation Age of Onset  . Breast cancer Mother 45  . Hypertension Mother   . Cancer Mother   . Stroke Mother   . Cancer Maternal Grandfather   . Heart disease Maternal Grandfather   . Heart disease Paternal Grandmother   . Colon cancer Paternal Grandfather   . Cancer Paternal Grandfather        testicular  . Cancer Father        associated with colon polyps  . Heart disease Father   . Hyperlipidemia Father   . Breast cancer Maternal Aunt     Social History   Socioeconomic History  . Marital status: Married    Spouse name: Not on file  . Number of children: 1  . Years of education: Not  on file  . Highest education level: Not on file  Occupational History  . Not on file  Tobacco Use  . Smoking status: Current Every Day Smoker    Packs/day: 0.80    Years: 15.00    Pack years: 12.00    Types: Cigarettes, Cigars  . Smokeless tobacco: Never Used  Vaping Use  . Vaping Use: Never used  Substance and Sexual Activity  . Alcohol use: Yes    Alcohol/week: 0.0 - 1.0 standard drinks    Comment: socially  . Drug use: No  . Sexual activity: Yes    Partners: Male    Birth control/protection: Surgical    Comment: BTL  Other Topics Concern  . Not on file  Social History Narrative  . Not on file   Social Determinants of Health   Financial Resource Strain: Not on file  Food Insecurity: Not on file  Transportation Needs: Not on file  Physical Activity: Not on file  Stress: Not on file  Social Connections: Not on file  Intimate Partner Violence: Not on file    ROS Review of Systems  Constitutional: Negative for activity change, appetite change, chills, fatigue and fever.  HENT: Negative for congestion, postnasal drip, rhinorrhea, sinus pressure and sinus pain.   Eyes: Negative.   Respiratory: Negative for cough, chest tightness, shortness of breath and wheezing.   Cardiovascular: Negative for chest pain and palpitations.       Elevated blood pressure  Gastrointestinal: Negative for constipation, diarrhea, nausea and vomiting.  Endocrine: Negative for cold intolerance, heat intolerance, polydipsia and polyuria.  Musculoskeletal: Positive for arthralgias and myalgias. Negative for back pain.       Patient does have fibromyalgia and has frequent bone and muscle pain.   Skin: Negative for rash.  Allergic/Immunologic: Negative.   Neurological: Negative for dizziness, weakness and headaches.  Hematological: Negative.   Psychiatric/Behavioral: Negative for dysphoric mood and sleep disturbance. The patient is not nervous/anxious.   All other systems reviewed and are  negative.   Objective:   Today's Vitals   04/09/21 1011 04/09/21 1032  BP: (!) 158/87 138/82  Pulse: (!) 57 (!) 56  Temp: 98.2 F (36.8 C)   SpO2: 97%   Weight: 183 lb 3.2 oz (83.1 kg)   Height: 5\' 3"  (1.6 m)    Body mass index is 32.45 kg/m.   Physical Exam Vitals and nursing note reviewed.  Constitutional:      Appearance: Normal appearance. She is well-developed.  HENT:     Head: Normocephalic and atraumatic.  Eyes:     Extraocular Movements: Extraocular movements intact.  Conjunctiva/sclera: Conjunctivae normal.     Pupils: Pupils are equal, round, and reactive to light.  Neck:     Vascular: No carotid bruit.  Cardiovascular:     Rate and Rhythm: Normal rate and regular rhythm.     Pulses: Normal pulses.     Heart sounds: Normal heart sounds.  Pulmonary:     Effort: Pulmonary effort is normal.     Breath sounds: Normal breath sounds.  Abdominal:     Palpations: Abdomen is soft.  Musculoskeletal:        General: Normal range of motion.     Cervical back: Normal range of motion and neck supple.     Comments: Right wrist Is currently in splint.   Skin:    General: Skin is warm and dry.     Capillary Refill: Capillary refill takes less than 2 seconds.  Neurological:     General: No focal deficit present.     Mental Status: She is alert and oriented to person, place, and time.  Psychiatric:        Mood and Affect: Mood normal.        Behavior: Behavior normal.        Thought Content: Thought content normal.        Judgment: Judgment normal.     Assessment & Plan:  1. Encounter to establish care Appointment today to establish new primary care provider,  2. Essential hypertension Start losartan 25mg  daily. Encouraged her to limit salt to 2000mg  daily and increase water intake in the diet. Check routine labs. - CBC with Differential/Platelet - Comprehensive metabolic panel - T4, free - TSH - losartan (COZAAR) 25 MG tablet; Take 1 tablet (25 mg total)  by mouth daily.  Dispense: 30 tablet; Refill: 2  3. Fibromyalgia Stable. Patient sees acupuncture provider and chiropractor. Brings her relief when needed.   4. Vitamin D deficiency Check vitamin d level and treat as indicated.  - Vitamin D 1,25 dihydroxy  5. Stable angina pectoris [facility]) Patient currently seeing a cardiologist for management   6. Encounter for screening mammogram for malignant neoplasm of breast Screening mammogram ordered.  - MM DIGITAL SCREENING BILATERAL; Future  7. Healthcare maintenance Check routine labs today - CBC with Differential/Platelet - Comprehensive metabolic panel - T4, free - TSH  Problem List Items Addressed This Visit      Cardiovascular and Mediastinum   Essential hypertension   Relevant Medications   losartan (COZAAR) 25 MG tablet   Other Relevant Orders   CBC with Differential/Platelet   Comprehensive metabolic panel   T4, free   TSH     Other   Chest pain   Encounter to establish care - Primary   Fibromyalgia   Vitamin D deficiency   Relevant Orders   Vitamin D 1,25 dihydroxy   Encounter for screening mammogram for malignant neoplasm of breast   Relevant Orders   MM [place] maintenance   Relevant Orders   CBC with Differential/Platelet   Comprehensive metabolic panel   T4, free   TSH      Outpatient Encounter Medications as of 04/09/2021  Medication Sig  . acetaminophen (TYLENOL) 500 MG tablet Take 500 mg by mouth as needed.  . cycloSPORINE (RESTASIS) 0.05 % ophthalmic emulsion 1 drop 2 (two) times daily.  . Doxylamine Succinate, Sleep, (SLEEP AID PO) Take 1 tablet by mouth at bedtime.  Marland Kitchen losartan (COZAAR) 25 MG tablet Take 1 tablet (25 mg total) by mouth  daily.  . mesalamine (LIALDA) 1.2 g EC tablet Take 1.2 g by mouth every other day.  . nitroGLYCERIN (NITROSTAT) 0.4 MG SL tablet Place 1 tablet (0.4 mg total) under the tongue every 5 (five) minutes as needed for chest pain.  Marland Kitchen  OMEPRAZOLE PO Take 20 mg by mouth daily.   No facility-administered encounter medications on file as of 04/09/2021.    Follow-up: Return in about 6 weeks (around 05/21/2021) for physical with pap  - htn.   Ronnell Freshwater, NP

## 2021-04-10 ENCOUNTER — Encounter: Payer: Self-pay | Admitting: Cardiology

## 2021-04-10 ENCOUNTER — Ambulatory Visit: Payer: 59 | Admitting: Cardiology

## 2021-04-10 VITALS — BP 143/91 | HR 67 | Temp 98.4°F | Resp 17 | Ht 63.0 in | Wt 183.0 lb

## 2021-04-10 DIAGNOSIS — I10 Essential (primary) hypertension: Secondary | ICD-10-CM

## 2021-04-10 DIAGNOSIS — R0789 Other chest pain: Secondary | ICD-10-CM | POA: Insufficient documentation

## 2021-04-10 DIAGNOSIS — E782 Mixed hyperlipidemia: Secondary | ICD-10-CM

## 2021-04-10 DIAGNOSIS — Z8249 Family history of ischemic heart disease and other diseases of the circulatory system: Secondary | ICD-10-CM

## 2021-04-10 DIAGNOSIS — F17210 Nicotine dependence, cigarettes, uncomplicated: Secondary | ICD-10-CM

## 2021-04-10 NOTE — Progress Notes (Signed)
Waiting on vitamin d results. Other labs good.

## 2021-04-10 NOTE — Progress Notes (Signed)
Subjective:   Kathy Stephens, female    DOB: [birth year], 60 y.o.   MRN: [id number]   Chief Complaint  Patient presents with  . Hypertension    HPI  60 y.o. Caucasian female with h/o skin cancer, ulcerative colitis, anxiety, depresson, fibromyalgia, family history of CAD, referred for evaluation of chest pressure  Reviewed recent tests results with patient, details below.  Patient was prescribed losartan by her PCP for hypertension, she has not started it yet.   Initial consultation HPI 03/2021: Patient is retired, lives fairly sedentary lifestyle.  She walks with her dog or 2 blocks couple times a week.  She has noticed chest pressure radiating to both shoulder blades, associated with physical activity such as cleaning in the house.  She also has exertional dyspnea with this level of activity.  She denies orthopnea, PND, leg edema symptoms.  Blood pressure elevated today.  At baseline, it is reportedly normal.  Patient was on antihypertensive medications in the past when she weighed 40 pounds heavier.  Since she lost weight, she was taken off antihypertensive therapy.  She did have history of what sounds like orthostatic hypotension in the past.  Currently, she denies any presyncope or syncope symptoms.  She smokes 3-4 cigars every day.  She is willing to quit, but is not ready to quit yet.  Patient's father had first MI in 37s, has had CABG and PCI since then, also has pacemaker.  She also has a little members with history of CAD, A. fib.    Current Outpatient Medications on File Prior to Visit  Medication Sig Dispense Refill  . acetaminophen (TYLENOL) 500 MG tablet Take 500 mg by mouth as needed.    . cycloSPORINE (RESTASIS) 0.05 % ophthalmic emulsion 1 drop 2 (two) times daily.    . Doxylamine Succinate, Sleep, (SLEEP AID PO) Take 1 tablet by mouth at bedtime.    Marland Kitchen losartan (COZAAR) 25 MG tablet Take 1 tablet (25 mg total) by mouth daily. 30 tablet 2  . mesalamine (LIALDA)  1.2 g EC tablet Take 1.2 g by mouth every other day.    . nitroGLYCERIN (NITROSTAT) 0.4 MG SL tablet Place 1 tablet (0.4 mg total) under the tongue every 5 (five) minutes as needed for chest pain. 30 tablet 1  . OMEPRAZOLE PO Take 20 mg by mouth daily.     No current facility-administered medications on file prior to visit.    Cardiovascular and other pertinent studies:  CT cardiac scoring 04/08/2021: Calcium score 0  Exercise Sestamibi Stress Test 03/25/2021: Normal ECG stress. The patient exercised for 9 minutes and 0 seconds of a Bruce protocol, achieving approximately 10.16 METs. Exercise was terminated due to fatigue/weakness.  Normal BP response.Normal exercise capacity. Myocardial perfusion is normal. Overall LV systolic function is normal without regional wall motion abnormalities. Stress LV EF: 56%.  No previous exam available for comparison. Low risk.   Echocardiogram 03/20/2021:  Normal LV systolic function with visual EF 55-60%. Left ventricle cavity  is normal in size. Normal global wall motion. Normal diastolic filling  pattern, normal LAP.  Mild (Grade I) mitral regurgitation.  No prior study for comparison.  EKG 03/11/2021:  Sinus rhythm 66 bpm  RSR(V1) -nondiagnostic   Recent labs: 03/29/2021: Chol 259, TG 185, HDL 56, LDL 169  02/14/2021: Glucose 97, BUN/Cr 24/0.97. EGFR 68. Na/K 141/4.3. AlKP 131. Rest of the CMP normal H/H 15/46. MCV 91. Platelets 184 HbA1C N/A Lipid panel N/A TSH N/A  Review of Systems  Cardiovascular: Negative for chest pain, dyspnea on exertion, leg swelling, palpitations and syncope.         Vitals:   04/10/21 1357 04/10/21 1402  BP: (!) 147/83 (!) 143/91  Pulse: 69 67  Resp: 17   Temp: 98.4 F (36.9 C)   SpO2: 97% 99%     Body mass index is 32.42 kg/m. Filed Weights   04/10/21 1357  Weight: 183 lb (83 kg)     Objective:   Physical Exam Vitals and nursing note reviewed.  Constitutional:      General: She is not  in acute distress. Neck:     Vascular: No JVD.  Cardiovascular:     Rate and Rhythm: Normal rate and regular rhythm.     Heart sounds: Normal heart sounds. No murmur heard.   Pulmonary:     Effort: Pulmonary effort is normal.     Breath sounds: Normal breath sounds. No wheezing or rales.  Musculoskeletal:     Right lower leg: No edema.     Left lower leg: No edema.         Assessment & Recommendations:   60 y.o. Caucasian female with h/o skin cancer, ulcerative colitis, anxiety, depresson, fibromyalgia, referred for evaluation of chest pressure  Chest pressure, exertional dyspnea: Now resolved.  Reassuring workup with negative SPECT MPI, calcium score 0. No aspirin needed.  Hypertension: Agree with losartan.  Mixed hyperlipidemia: LDL 169. 10-year ASCVD risk 8.7%.  She wants to pursue diet and lifestyle modifications at this time and follow-up with PCP.  Nicotine dependence: Further reinforced tobacco cessation with the patient.  She is currently down to 5 cigarettes a day and hoping to quit soon.   Follow-up with me as needed.     Nigel Mormon, MD Pager: [phone] Office: [phone]

## 2021-04-15 ENCOUNTER — Other Ambulatory Visit: Payer: Self-pay | Admitting: Nurse Practitioner

## 2021-04-15 ENCOUNTER — Ambulatory Visit
Admission: RE | Admit: 2021-04-15 | Discharge: 2021-04-15 | Disposition: A | Discharge status: Home / Self Care | Payer: 59 | Source: Ambulatory Visit | Attending: Nurse Practitioner | Admitting: Nurse Practitioner

## 2021-04-15 ENCOUNTER — Other Ambulatory Visit: Payer: Self-pay

## 2021-04-15 DIAGNOSIS — Z1231 Encounter for screening mammogram for malignant neoplasm of breast: Secondary | ICD-10-CM

## 2021-04-16 LAB — COMPREHENSIVE METABOLIC PANEL
ALT: 10 IU/L (ref 0–32)
AST: 13 IU/L (ref 0–40)
Albumin/Globulin Ratio: 1.7 (ref 1.2–2.2)
Albumin: 4.4 g/dL (ref 3.8–4.9)
Alkaline Phosphatase: 105 IU/L (ref 44–121)
BUN/Creatinine Ratio: 9 (ref 9–23)
BUN: 9 mg/dL (ref 6–24)
Bilirubin Total: <0.2 mg/dL (ref 0.0–1.2)
CO2: 22 mmol/L (ref 20–29)
Calcium: 9.6 mg/dL (ref 8.7–10.2)
Chloride: 102 mmol/L (ref 96–106)
Creatinine, Ser: 0.98 mg/dL (ref 0.57–1.00)
Globulin, Total: 2.6 g/dL (ref 1.5–4.5)
Glucose: 78 mg/dL (ref 65–99)
Potassium: 4.6 mmol/L (ref 3.5–5.2)
Sodium: 142 mmol/L (ref 134–144)
Total Protein: 7 g/dL (ref 6.0–8.5)
eGFR: 67 mL/min/{1.73_m2} (ref >59)

## 2021-04-16 LAB — CBC WITH DIFFERENTIAL/PLATELET
Basophils Absolute: 0.1 10*3/uL (ref 0.0–0.2)
Basos: 1 %
EOS (ABSOLUTE): 0.1 10*3/uL (ref 0.0–0.4)
Eos: 2 %
Hematocrit: 45.8 % (ref 34.0–46.6)
Hemoglobin: 15.1 g/dL (ref 11.1–15.9)
Immature Grans (Abs): 0 10*3/uL (ref 0.0–0.1)
Immature Granulocytes: 0 %
Lymphocytes Absolute: 1.9 10*3/uL (ref 0.7–3.1)
Lymphs: 30 %
MCH: 30 pg (ref 26.6–33.0)
MCHC: 33 g/dL (ref 31.5–35.7)
MCV: 91 fL (ref 79–97)
Monocytes Absolute: 0.4 10*3/uL (ref 0.1–0.9)
Monocytes: 6 %
Neutrophils Absolute: 3.7 10*3/uL (ref 1.4–7.0)
Neutrophils: 61 %
Platelets: 199 10*3/uL (ref 150–450)
RBC: 5.03 x10E6/uL (ref 3.77–5.28)
RDW: 11.8 % (ref 11.7–15.4)
WBC: 6.2 10*3/uL (ref 3.4–10.8)

## 2021-04-16 LAB — T4, FREE: Free T4: 1.24 ng/dL (ref 0.82–1.77)

## 2021-04-16 LAB — VITAMIN D 1,25 DIHYDROXY
Vitamin D 1, 25 (OH)2 Total: 32 pg/mL
Vitamin D2 1, 25 (OH)2: <10 pg/mL
Vitamin D3 1, 25 (OH)2: 32 pg/mL

## 2021-04-16 LAB — TSH: TSH: 2.11 u[IU]/mL (ref 0.450–4.500)

## 2021-04-17 NOTE — Progress Notes (Signed)
Lab results good. Discuss with patient at visit 05/2021

## 2021-04-18 NOTE — Progress Notes (Signed)
Negative mammogram. Repeat screening in one year .

## 2021-05-20 ENCOUNTER — Other Ambulatory Visit: Payer: Self-pay

## 2021-05-20 ENCOUNTER — Ambulatory Visit (INDEPENDENT_AMBULATORY_CARE_PROVIDER_SITE_OTHER): Payer: 59 | Admitting: Nurse Practitioner

## 2021-05-20 ENCOUNTER — Encounter: Payer: Self-pay | Admitting: Nurse Practitioner

## 2021-05-20 ENCOUNTER — Other Ambulatory Visit (HOSPITAL_COMMUNITY)
Admission: RE | Admit: 2021-05-20 | Discharge: 2021-05-20 | Disposition: A | Discharge status: Home / Self Care | Payer: 59 | Source: Ambulatory Visit | Attending: Nurse Practitioner | Admitting: Nurse Practitioner

## 2021-05-20 VITALS — BP 122/78 | HR 66 | Temp 97.4°F | Ht 63.0 in | Wt 181.5 lb

## 2021-05-20 DIAGNOSIS — Z6832 Body mass index (BMI) 32.0-32.9, adult: Secondary | ICD-10-CM | POA: Diagnosis not present

## 2021-05-20 DIAGNOSIS — Z01419 Encounter for gynecological examination (general) (routine) without abnormal findings: Secondary | ICD-10-CM | POA: Insufficient documentation

## 2021-05-20 DIAGNOSIS — E782 Mixed hyperlipidemia: Secondary | ICD-10-CM | POA: Diagnosis not present

## 2021-05-20 DIAGNOSIS — I10 Essential (primary) hypertension: Secondary | ICD-10-CM | POA: Diagnosis not present

## 2021-05-20 DIAGNOSIS — F17210 Nicotine dependence, cigarettes, uncomplicated: Secondary | ICD-10-CM

## 2021-05-20 NOTE — Patient Instructions (Signed)
Calorie Counting for Weight Loss Calories are units of energy. Your body needs a certain number of calories from food to keep going throughout the day. When you eat or drink more calories than your body needs, your body stores the extra calories mostly as fat. When you eat or drink fewer calories than your body needs, your body burns fat to getthe energy it needs. Calorie counting means keeping track of how many calories you eat and drink each day. Calorie counting can be helpful if you need to lose weight. If you eat fewer calories than your body needs, you should lose weight. Ask yourhealth care provider what a healthy weight is for you. For calorie counting to work, you will need to eat the right number of calories each day to lose a healthy amount of weight per week. A dietitian can help you figure out how many calories you need in a day and will suggest ways to reach your calorie goal. A healthy amount of weight to lose each week is usually 1-2 lb (0.5-0.9 kg). This usually means that your daily calorie intake should be reduced by 500-750 calories. Eating 1,200-1,500 calories a day can help most women lose weight. Eating 1,500-1,800 calories a day can help most men lose weight. What do I need to know about calorie counting? Work with your health care provider or dietitian to determine how many calories you should get each day. To meet your daily calorie goal, you will need to: Find out how many calories are in each food that you would like to eat. Try to do this before you eat. Decide how much of the food you plan to eat. Keep a food log. Do this by writing down what you ate and how many calories it had. To successfully lose weight, it is important to balance calorie counting with ahealthy lifestyle that includes regular activity. Where do I find calorie information?  The number of calories in a food can be found on a Nutrition Facts label. If a food does not have a Nutrition Facts label, try to  look up the calories onlineor ask your dietitian for help. Remember that calories are listed per serving. If you choose to have more than one serving of a food, you will have to multiply the calories per serving by the number of servings you plan to eat. For example, the label on a package of bread might say that a serving size is 1 slice and that there are 90 calories in a serving. If you eat 1 slice, you will have eaten 90 calories. If you eat 2slices, you will have eaten 180 calories. How do I keep a food log? After each time that you eat, record the following in your food log as soon as possible: What you ate. Be sure to include toppings, sauces, and other extras on the food. How much you ate. This can be measured in cups, ounces, or number of items. How many calories were in each food and drink. The total number of calories in the food you ate. Keep your food log near you, such as in a pocket-sized notebook or on an app or website on your mobile phone. Some programs will calculate calories for you andshow you how many calories you have left to meet your daily goal. What are some portion-control tips? Know how many calories are in a serving. This will help you know how many servings you can have of a certain food. Use a measuring cup to  measure serving sizes. You could also try weighing out portions on a kitchen scale. With time, you will be able to estimate serving sizes for some foods. Take time to put servings of different foods on your favorite plates or in your favorite bowls and cups so you know what a serving looks like. Try not to eat straight from a food's packaging, such as from a bag or box. Eating straight from the package makes it hard to see how much you are eating and can lead to overeating. Put the amount you would like to eat in a cup or on a plate to make sure you are eating the right portion. Use smaller plates, glasses, and bowls for smaller portions and to prevent  overeating. Try not to multitask. For example, avoid watching TV or using your computer while eating. If it is time to eat, sit down at a table and enjoy your food. This will help you recognize when you are full. It will also help you be more mindful of what and how much you are eating. What are tips for following this plan? Reading food labels Check the calorie count compared with the serving size. The serving size may be smaller than what you are used to eating. Check the source of the calories. Try to choose foods that are high in protein, fiber, and vitamins, and low in saturated fat, trans fat, and sodium. Shopping Read nutrition labels while you shop. This will help you make healthy decisions about which foods to buy. Pay attention to nutrition labels for low-fat or fat-free foods. These foods sometimes have the same number of calories or more calories than the full-fat versions. They also often have added sugar, starch, or salt to make up for flavor that was removed with the fat. Make a grocery list of lower-calorie foods and stick to it. Cooking Try to cook your favorite foods in a healthier way. For example, try baking instead of frying. Use low-fat dairy products. Meal planning Use more fruits and vegetables. One-half of your plate should be fruits and vegetables. Include lean proteins, such as chicken, [place], and fish. Lifestyle Each week, aim to do one of the following: 150 minutes of moderate exercise, such as walking. 75 minutes of vigorous exercise, such as running. General information Know how many calories are in the foods you eat most often. This will help you calculate calorie counts faster. Find a way of tracking calories that works for you. Get creative. Try different apps or programs if writing down calories does not work for you. What foods should I eat?  Eat nutritious foods. It is better to have a nutritious, high-calorie food, such as an avocado, than a food with  few nutrients, such as a bag of potato chips. Use your calories on foods and drinks that will fill you up and will not leave you hungry soon after eating. Examples of foods that fill you up are nuts and nut butters, vegetables, lean proteins, and high-fiber foods such as whole grains. High-fiber foods are foods with more than 5 g of fiber per serving. Pay attention to calories in drinks. Low-calorie drinks include water and unsweetened drinks. The items listed above may not be a complete list of foods and beverages you can eat. Contact a dietitian for more information. What foods should I limit? Limit foods or drinks that are not good sources of vitamins, minerals, or protein or that are high in unhealthy fats. These include: Candy. Other sweets. Sodas, specialty  coffee drinks, alcohol, and juice. The items listed above may not be a complete list of foods and beverages you should avoid. Contact a dietitian for more information. How do I count calories when eating out? Pay attention to portions. Often, portions are much larger when eating out. Try these tips to keep portions smaller: Consider sharing a meal instead of getting your own. If you get your own meal, eat only half of it. Before you start eating, ask for a container and put half of your meal into it. When available, consider ordering smaller portions from the menu instead of full portions. Pay attention to your food and drink choices. Knowing the way food is cooked and what is included with the meal can help you eat fewer calories. If calories are listed on the menu, choose the lower-calorie options. Choose dishes that include vegetables, fruits, whole grains, low-fat dairy products, and lean proteins. Choose items that are boiled, broiled, grilled, or steamed. Avoid items that are buttered, battered, fried, or served with cream sauce. Items labeled as crispy are usually fried, unless stated otherwise. Choose water, low-fat milk,  unsweetened iced tea, or other drinks without added sugar. If you want an alcoholic beverage, choose a lower-calorie option, such as a glass of wine or light beer. Ask for dressings, sauces, and syrups on the side. These are usually high in calories, so you should limit the amount you eat. If you want a salad, choose a garden salad and ask for grilled meats. Avoid extra toppings such as bacon, cheese, or fried items. Ask for the dressing on the side, or ask for olive oil and vinegar or lemon to use as dressing. Estimate how many servings of a food you are given. Knowing serving sizes will help you be aware of how much food you are eating at restaurants. Where to find more information Centers for Disease Control and Prevention: http://www.wolf.info/ [place] Department of Agriculture: http://www.wilson-mendoza.org/ Summary Calorie counting means keeping track of how many calories you eat and drink each day. If you eat fewer calories than your body needs, you should lose weight. A healthy amount of weight to lose per week is usually 1-2 lb (0.5-0.9 kg). This usually means reducing your daily calorie intake by 500-750 calories. The number of calories in a food can be found on a Nutrition Facts label. If a food does not have a Nutrition Facts label, try to look up the calories online or ask your dietitian for help. Use smaller plates, glasses, and bowls for smaller portions and to prevent overeating. Use your calories on foods and drinks that will fill you up and not leave you hungry shortly after a meal. This information is not intended to replace advice given to you by your health care provider. Make sure you discuss any questions you have with your healthcare provider. Document Revised: 12/01/2019 Document Reviewed: 12/01/2019 Elsevier Patient Education  2022 [place] and Cholesterol Restricted Eating Plan Getting too much fat and cholesterol in your diet may cause health problems. Choosing the right foods helps keep  your fat and cholesterol at normal levels.This can keep you from getting certain diseases. Your doctor may recommend an eating plan that includes: Total fat: ______% or less of total calories a day. Saturated fat: ______% or less of total calories a day. Cholesterol: less than _________mg a day. Fiber: ______g a day. What are tips for following this plan? Meal planning At meals, divide your plate into four equal parts: Fill one-half of  your plate with vegetables and green salads. Fill one-fourth of your plate with whole grains. Fill one-fourth of your plate with low-fat (lean) protein foods. Eat fish that is high in omega-3 fats at least two times a week. This includes mackerel, tuna, sardines, and salmon. Eat foods that are high in fiber, such as whole grains, beans, apples, broccoli, carrots, peas, and barley. General tips  Work with your doctor to lose weight if you need to. Avoid: Foods with added sugar. Fried foods. Foods with partially hydrogenated oils. Limit alcohol intake to no more than 1 drink a day for nonpregnant women and 2 drinks a day for men. One drink equals 12 oz of beer, 5 oz of wine, or 1 oz of hard liquor.  Reading food labels Check food labels for: Trans fats. Partially hydrogenated oils. Saturated fat (g) in each serving. Cholesterol (mg) in each serving. Fiber (g) in each serving. Choose foods with healthy fats, such as: Monounsaturated fats. Polyunsaturated fats. Omega-3 fats. Choose grain products that have whole grains. Look for the word "whole" as the first word in the ingredient list. Cooking Cook foods using low-fat methods. These include baking, boiling, grilling, and broiling. Eat more home-cooked foods. Eat at restaurants and buffets less often. Avoid cooking using saturated fats, such as butter, cream, palm oil, palm kernel oil, and coconut oil. Recommended foods  Fruits All fresh, canned (in natural juice), or frozen  fruits. Vegetables Fresh or frozen vegetables (raw, steamed, roasted, or grilled). Green salads. Grains Whole grains, such as whole wheat or whole grain breads, crackers, cereals, and pasta. Unsweetened oatmeal, bulgur, barley, quinoa, or brown rice. Corn or whole wheat flour tortillas. Meats and other protein foods Ground beef (85% or leaner), grass-fed beef, or beef trimmed of fat. Skinless chicken or [place]. Ground chicken or [place]. Pork trimmed of fat. All fish and seafood. Egg whites. Dried beans, peas, or lentils. Unsalted nuts or seeds. Unsalted canned beans. Nut butters without added sugar or oil. Dairy Low-fat or nonfat dairy products, such as skim or 1% milk, 2% or reduced-fat cheeses, low-fat and fat-free ricotta or cottage cheese, or plain low-fat and nonfat yogurt. Fats and oils Tub margarine without trans fats. Light or reduced-fat mayonnaise and salad dressings. Avocado. Olive, canola, sesame, or safflower oils. The items listed above may not be a complete list of foods and beverages youcan eat. Contact a dietitian for more information. Foods to avoid Fruits Canned fruit in heavy syrup. Fruit in cream or butter sauce. Fried fruit. Vegetables Vegetables cooked in cheese, cream, or butter sauce. Fried vegetables. Grains White bread. White pasta. White rice. Cornbread. Bagels, pastries, and croissants. Crackers and snack foods that contain trans fat and hydrogenated oils. Meats and other protein foods Fatty cuts of meat. Ribs, chicken wings, bacon, sausage, bologna, salami, chitterlings, fatback, hot dogs, bratwurst, and packaged lunch meats. Liver and organ meats. Whole eggs and egg yolks. Chicken and [place] with skin. Fried meat. Dairy Whole or 2% milk, cream, half-and-half, and cream cheese. Whole milk cheeses. Whole-fat or sweetened yogurt. Full-fat cheeses. Nondairy creamers and whipped toppings. Processed cheese, cheese spreads, and cheese curds. Beverages Alcohol.  Sugar-sweetened drinks such as sodas, lemonade, and fruit drinks. Fats and oils Butter, stick margarine, lard, shortening, ghee, or bacon fat. Coconut, palm kernel, and palm oils. Sweets and desserts Corn syrup, sugars, honey, and molasses. Candy. Jam and jelly. Syrup. Sweetened cereals. Cookies, pies, cakes, donuts, muffins, and ice cream. The items listed above may not be a complete list  of foods and beverages youshould avoid. Contact a dietitian for more information. Summary Choosing the right foods helps keep your fat and cholesterol at normal levels. This can keep you from getting certain diseases. At meals, fill one-half of your plate with vegetables and green salads. Eat high-fiber foods, like whole grains, beans, apples, carrots, peas, and barley. Limit added sugar, saturated fats, alcohol, and fried foods. This information is not intended to replace advice given to you by your health care provider. Make sure you discuss any questions you have with your healthcare provider. Document Revised: 02/22/2020 Document Reviewed: 02/22/2020 Elsevier Patient Education  2022 [employer].

## 2021-05-20 NOTE — Progress Notes (Signed)
Established Patient Office Visit  Subjective:  Patient ID: Kathy Stephens, female    DOB: 12/01/1960  Age: 60 y.o. MRN: [id number]  CC:  Chief Complaint  Patient presents with   Annual Exam   Gynecologic Exam    HPI Kathy Stephens presents for annual wellness exam and Pap smear.  Blood pressure is doing well on current dose of losartan.  States she does have a little fatigue and some leg pain intermittently.  He is can be potential side effects of losartan.  Change medications but do warrant monitoring.  He did have labs done prior to this visit.  Cholesterol panel showed mild, generalized elevation.  Her other labs were essentially normal.  She has since added dose of co-Q10.  She has limited her intake of fried and fatty foods.  She is on a low-carb diet.  Increased her regular physical activity.  Lipid Panel     Component Value Date/Time   CHOL 259 (H) 03/29/2021 0743   TRIG 185 (H) 03/29/2021 0743   HDL 56 03/29/2021 0743   CHOLHDL 4.6 (H) 03/29/2021 0743   CHOLHDL 5.2 02/03/2013 1224   VLDL 31 02/03/2013 1224   LDLCALC 169 (H) 03/29/2021 0743   LABVLDL 34 03/29/2021 0743   She has no other physical concerns or complaints today. She denies chest pain, chest pressure, or shortness of breath. She denies headaches or visual disturbances. She denies abdominal pain, nausea, vomiting, or changes in bowel or bladder habits.   She recently had mammogram which was negative.  Past Medical History:  Diagnosis Date   Allergy    Anemia    Anxiety    Cancer ([place])    skin (nose & face)   Depression    Fibromyalgia    Fibromyalgia 04/09/2021   GERD (gastroesophageal reflux disease)    Inflammatory bowel disease (ulcerative colitis) ([place])    Migraines    Osteopenia     Past Surgical History:  Procedure Laterality Date   CESAREAN SECTION     CHOLECYSTECTOMY  03/19/2012   Procedure: LAPAROSCOPIC CHOLECYSTECTOMY;  Surgeon: Gwenyth Ober, MD;  Location: [facility];  Service: General;   Laterality: N/A;   FOOT SURGERY  1980's   LAPAROSCOPY     with cystectomy   LEFT OOPHORECTOMY     tendon relief     TUBAL LIGATION      Family History  Problem Relation Age of Onset   Breast cancer Mother 52   Hypertension Mother    Cancer Mother    Stroke Mother    Cancer Maternal Grandfather    Heart disease Maternal Grandfather    Heart disease Paternal Grandmother    Colon cancer Paternal Grandfather    Cancer Paternal Grandfather        testicular   Cancer Father        associated with colon polyps   Heart disease Father    Hyperlipidemia Father    Breast cancer Maternal Aunt     Social History   Socioeconomic History   Marital status: Married    Spouse name: Not on file   Number of children: 1   Years of education: Not on file   Highest education level: Not on file  Occupational History   Not on file  Tobacco Use   Smoking status: Every Day    Packs/day: 0.80    Years: 15.00    Pack years: 12.00    Types: Cigarettes, Cigars   Smokeless tobacco: Never  Vaping Use   Vaping Use: Never used  Substance and Sexual Activity   Alcohol use: Yes    Alcohol/week: 0.0 - 1.0 standard drinks    Comment: socially   Drug use: No   Sexual activity: Yes    Partners: Male    Birth control/protection: Surgical    Comment: BTL  Other Topics Concern   Not on file  Social History Narrative   Not on file   Social Determinants of Health   Financial Resource Strain: Not on file  Food Insecurity: Not on file  Transportation Needs: Not on file  Physical Activity: Not on file  Stress: Not on file  Social Connections: Not on file  Intimate Partner Violence: Not on file    Outpatient Medications Prior to Visit  Medication Sig Dispense Refill   acetaminophen (TYLENOL) 500 MG tablet Take 500 mg by mouth as needed.     cycloSPORINE (RESTASIS) 0.05 % ophthalmic emulsion 1 drop 2 (two) times daily.     Doxylamine Succinate, Sleep, (SLEEP AID PO) Take 1 tablet by mouth at  bedtime.     losartan (COZAAR) 25 MG tablet Take 1 tablet (25 mg total) by mouth daily. 30 tablet 2   mesalamine (LIALDA) 1.2 g EC tablet Take 1.2 g by mouth every other day.     OMEPRAZOLE PO Take 20 mg by mouth daily.     nitroGLYCERIN (NITROSTAT) 0.4 MG SL tablet Place 1 tablet (0.4 mg total) under the tongue every 5 (five) minutes as needed for chest pain. 30 tablet 1   No facility-administered medications prior to visit.    Allergies  Allergen Reactions   Prednisone     Mouth swelled up   Azithromycin Rash    ROS Review of Systems  Constitutional:  Positive for fatigue. Negative for activity change, chills and fever.  HENT:  Negative for congestion, postnasal drip, rhinorrhea, sinus pressure and sinus pain.   Eyes: Negative.   Respiratory:  Negative for cough, chest tightness and shortness of breath.   Cardiovascular:  Negative for chest pain and palpitations.       Blood pressure well managed with current dose of losartan.  Gastrointestinal:  Negative for constipation, diarrhea, nausea and vomiting.  Endocrine: Negative for cold intolerance, heat intolerance, polydipsia and polyuria.  Genitourinary:  Negative for dysuria, flank pain, frequency, pelvic pain and urgency.  Musculoskeletal:  Positive for arthralgias and myalgias. Negative for back pain.       Mild, intermittent leg pain.  Describes as achiness, mostly in the morning.  Improves as day goes on.  Skin:  Negative for rash.  Allergic/Immunologic: Negative.   Neurological:  Negative for dizziness, weakness and headaches.  Psychiatric/Behavioral:  Negative for dysphoric mood and sleep disturbance. The patient is not nervous/anxious.      Objective:    Physical Exam Vitals and nursing note reviewed. Exam conducted with a chaperone present.  Constitutional:      Appearance: Normal appearance. She is well-developed. She is obese.  HENT:     Head: Normocephalic and atraumatic.     Right Ear: Ear canal and external  ear normal.     Left Ear: Ear canal and external ear normal.     Nose: Nose normal.     Mouth/Throat:     Mouth: Mucous membranes are moist.     Pharynx: Oropharynx is clear.  Eyes:     Extraocular Movements: Extraocular movements intact.     Conjunctiva/sclera: Conjunctivae normal.  Pupils: Pupils are equal, round, and reactive to light.  Neck:     Vascular: No carotid bruit.  Cardiovascular:     Rate and Rhythm: Normal rate and regular rhythm.     Pulses: Normal pulses.     Heart sounds: Normal heart sounds.  Pulmonary:     Effort: Pulmonary effort is normal.     Breath sounds: Normal breath sounds.  Chest:  Breasts:    Right: Normal. No swelling, bleeding, inverted nipple, mass, nipple discharge, skin change, tenderness or axillary adenopathy.     Left: Normal. No swelling, bleeding, inverted nipple, mass, nipple discharge, skin change, tenderness or axillary adenopathy.  Abdominal:     General: Bowel sounds are normal.     Palpations: Abdomen is soft.     Tenderness: There is no abdominal tenderness.     Hernia: There is no hernia in the left inguinal area or right inguinal area.  Genitourinary:    Exam position: Supine.     Labia:        Right: No rash, tenderness or lesion.        Left: No rash, tenderness or lesion.      Vagina: Normal. No vaginal discharge, erythema, tenderness, bleeding, lesions or prolapsed vaginal walls.     Cervix: No cervical motion tenderness, discharge, lesion, erythema or cervical bleeding.     Uterus: Normal.      Adnexa: Right adnexa normal and left adnexa normal.     Comments: No tenderness, masses, or organomeglay present during bimanual exam .   Musculoskeletal:        General: Normal range of motion.     Cervical back: Normal range of motion and neck supple.  Lymphadenopathy:     Cervical: No cervical adenopathy.     Upper Body:     Right upper body: No axillary adenopathy.     Left upper body: No axillary adenopathy.     Lower  Body: No right inguinal adenopathy. No left inguinal adenopathy.  Skin:    General: Skin is warm and dry.     Capillary Refill: Capillary refill takes less than 2 seconds.  Neurological:     General: No focal deficit present.     Mental Status: She is alert and oriented to person, place, and time.  Psychiatric:        Mood and Affect: Mood normal.        Behavior: Behavior normal.        Thought Content: Thought content normal.        Judgment: Judgment normal.    Today's Vitals   05/20/21 1004  BP: 122/78  Pulse: 66  Temp: (!) 97.4 F (36.3 C)  SpO2: 99%  Weight: 181 lb 8 oz (82.3 kg)  Height: 5' 3"  (1.6 m)   Body mass index is 32.15 kg/m.   Wt Readings from Last 3 Encounters:  05/20/21 181 lb 8 oz (82.3 kg)  04/10/21 183 lb (83 kg)  04/09/21 183 lb 3.2 oz (83.1 kg)     Health Maintenance Due  Topic Date Due   COVID-19 Vaccine (1) Never done   Pneumococcal Vaccine 3-82 Years old (1 - PCV) Never done   HIV Screening  Never done   Hepatitis C Screening  Never done   Zoster Vaccines- Shingrix (1 of 2) Never done   TETANUS/TDAP  05/23/2020   PAP SMEAR-Modifier  11/26/2020    There are no preventive care reminders to display for this patient.  Lab  Results  Component Value Date   TSH 2.110 04/09/2021   Lab Results  Component Value Date   WBC 6.2 04/09/2021   HGB 15.1 04/09/2021   HCT 45.8 04/09/2021   MCV 91 04/09/2021   PLT 199 04/09/2021   Lab Results  Component Value Date   NA 142 04/09/2021   K 4.6 04/09/2021   CO2 22 04/09/2021   GLUCOSE 78 04/09/2021   BUN 9 04/09/2021   CREATININE 0.98 04/09/2021   BILITOT <0.2 04/09/2021   ALKPHOS 105 04/09/2021   AST 13 04/09/2021   ALT 10 04/09/2021   PROT 7.0 04/09/2021   ALBUMIN 4.4 04/09/2021   CALCIUM 9.6 04/09/2021   EGFR 67 04/09/2021   Lab Results  Component Value Date   CHOL 259 (H) 03/29/2021   Lab Results  Component Value Date   HDL 56 03/29/2021   Lab Results  Component Value Date    LDLCALC 169 (H) 03/29/2021   Lab Results  Component Value Date   TRIG 185 (H) 03/29/2021   Lab Results  Component Value Date   CHOLHDL 4.6 (H) 03/29/2021   No results found for: HGBA1C    Assessment & Plan:  1. Well woman exam Annual wellness visit today with Pap smear. - Cytology - PAP( [place])  2. Essential hypertension Blood pressure stable on current dose losartan.  Continue daily.  3. Mixed hyperlipidemia Patient has already started limiting fried and fatty foods in her diet.  Currently limiting intake of carbohydrates.  Increased daily physical activity.  We will recheck cholesterol next year.  4. Body mass index (BMI) of 32.0-32.9 in adult Patient practicing a low carbohydrate diet.  She is limiting her calorie intake to 1200 to 1500 cal/day.  Increase physical activity.  Will monitor.  5. Cigarette smoker Patient smoking little more than half a pack cigarettes per day.  Not ready to quit at this time.  Problem List Items Addressed This Visit       Cardiovascular and Mediastinum   Essential hypertension     Other   Mixed hyperlipidemia   Cigarette smoker   Well woman exam - Primary   Relevant Orders   Cytology - PAP( [place])   Body mass index (BMI) of 32.0-32.9 in adult      Follow-up: Return in about 6 months (around 11/20/2021) for blood pressure.    Ronnell Freshwater, NP

## 2021-05-21 LAB — CYTOLOGY - PAP
Adequacy: ABSENT
Comment: NEGATIVE
Diagnosis: NEGATIVE
High risk HPV: NEGATIVE

## 2021-05-21 NOTE — Progress Notes (Signed)
Normal pap. Will repeat in three years .

## 2022-03-03 ENCOUNTER — Other Ambulatory Visit: Payer: Self-pay | Admitting: Nurse Practitioner

## 2022-03-03 DIAGNOSIS — Z1231 Encounter for screening mammogram for malignant neoplasm of breast: Secondary | ICD-10-CM

## 2022-03-12 ENCOUNTER — Ambulatory Visit (INDEPENDENT_AMBULATORY_CARE_PROVIDER_SITE_OTHER): Payer: 59 | Admitting: Nurse Practitioner

## 2022-03-12 ENCOUNTER — Encounter: Payer: Self-pay | Admitting: Nurse Practitioner

## 2022-03-12 VITALS — BP 122/78 | HR 67 | Temp 98.0°F | Ht 63.0 in | Wt 184.8 lb

## 2022-03-12 DIAGNOSIS — R109 Unspecified abdominal pain: Secondary | ICD-10-CM | POA: Diagnosis not present

## 2022-03-12 DIAGNOSIS — R1032 Left lower quadrant pain: Secondary | ICD-10-CM | POA: Diagnosis not present

## 2022-03-12 DIAGNOSIS — K529 Noninfective gastroenteritis and colitis, unspecified: Secondary | ICD-10-CM

## 2022-03-12 LAB — POCT URINALYSIS DIP (CLINITEK)
Bilirubin, UA: NEGATIVE
Blood, UA: NEGATIVE
Glucose, UA: NEGATIVE mg/dL
Ketones, POC UA: NEGATIVE mg/dL
Leukocytes, UA: NEGATIVE
Nitrite, UA: NEGATIVE
POC PROTEIN,UA: NEGATIVE
Spec Grav, UA: 1.025 (ref 1.010–1.025)
Urobilinogen, UA: 0.2 E.U./dL
pH, UA: 5.5 (ref 5.0–8.0)

## 2022-03-12 NOTE — Progress Notes (Signed)
Established patient visit ? ? ?Patient: Kathy Stephens   DOB: Sep 15, 1961   61 y.o. Female  MRN: [id number] ?Visit Date: 03/12/2022 ? ?Chief Complaint  ?Patient presents with  ? Abdominal Pain  ? ?Subjective  ?  ?Abdominal Pain ?This is a new problem. The current episode started in the past 7 days. The onset quality is sudden. The problem occurs constantly. The pain is located in the LLQ and left flank. The pain is at a severity of 6/10. Quality: aching type pain constant. iftaking a deep breath in or coughing, she gets worse stabbing pain in left lower quadrant of the abdoemn. Pain radiation: radiated to umbilicalarea when bending over. Associated symptoms include anorexia, constipation, a fever, headaches and nausea. Pertinent negatives include no arthralgias, diarrhea, dysuria, frequency, hematuria, myalgias or vomiting. Associated symptoms comments: The patient states that she has history of ulcerative colitis. Normally, she has three or four loose bowel movements per day. States that in last two days, she has been nearly constipated. Has had solid bowel movements. . The pain is aggravated by deep breathing, coughing, movement and belching. The pain is relieved by Nothing. She has tried acetaminophen for the symptoms. The treatment provided no relief. Her past medical history is significant for ulcerative colitis.   ? ? ?Medications: ?Outpatient Medications Prior to Visit  ?Medication Sig  ? acetaminophen (TYLENOL) 500 MG tablet Take 500 mg by mouth as needed.  ? cycloSPORINE (RESTASIS) 0.05 % ophthalmic emulsion 1 drop 2 (two) times daily.  ? Doxylamine Succinate, Sleep, (SLEEP AID PO) Take 1 tablet by mouth at bedtime.  ? mesalamine (LIALDA) 1.2 g EC tablet Take 1.2 g by mouth every other day.  ? OMEPRAZOLE PO Take 20 mg by mouth daily.  ? [DISCONTINUED] losartan (COZAAR) 25 MG tablet Take 1 tablet (25 mg total) by mouth daily.  ? ?No facility-administered medications prior to visit.  ? ? ?Review of Systems   ?Constitutional:  Positive for appetite change, fatigue and fever. Negative for activity change and chills.  ?     Appetite decrease since pain started.   ?HENT:  Negative for congestion, postnasal drip, rhinorrhea, sinus pressure, sinus pain, sneezing and sore throat.   ?Eyes: Negative.   ?Respiratory:  Negative for cough, chest tightness, shortness of breath and wheezing.   ?Cardiovascular:  Negative for chest pain and palpitations.  ?Gastrointestinal:  Positive for abdominal pain, anorexia, constipation and nausea. Negative for diarrhea and vomiting.  ?Endocrine: Negative for cold intolerance, heat intolerance, polydipsia and polyuria.  ?Genitourinary:  Positive for flank pain. Negative for dyspareunia, dysuria, frequency, hematuria, pelvic pain and urgency.  ?Musculoskeletal:  Negative for arthralgias, back pain and myalgias.  ?Skin:  Negative for rash.  ?Allergic/Immunologic: Negative for environmental allergies.  ?Neurological:  Positive for headaches. Negative for dizziness and weakness.  ?Hematological:  Negative for adenopathy.  ?Psychiatric/Behavioral:  The patient is not nervous/anxious.   ? ? Objective  ?  ? ?Today's Vitals  ? 03/12/22 1442  ?BP: 122/78  ?Pulse: 67  ?Temp: 98 ?F (36.7 ?C)  ?SpO2: 98%  ?Weight: 184 lb 12.8 oz (83.8 kg)  ?Height: '5\' 3"'$  (1.6 m)  ? ?Body mass index is 32.74 kg/m?.  ? ?Physical Exam ?Vitals and nursing note reviewed.  ?Constitutional:   ?   Appearance: Normal appearance. She is well-developed. She is ill-appearing.  ?HENT:  ?   Head: Normocephalic and atraumatic.  ?Eyes:  ?   Pupils: Pupils are equal, round, and reactive to light.  ?Cardiovascular:  ?  Rate and Rhythm: Normal rate and regular rhythm.  ?   Pulses: Normal pulses.  ?   Heart sounds: Normal heart sounds.  ?Pulmonary:  ?   Effort: Pulmonary effort is normal.  ?   Breath sounds: Normal breath sounds.  ?Abdominal:  ?   General: Bowel sounds are decreased.  ?   Palpations: Abdomen is soft. There is no shifting  dullness, fluid wave, hepatomegaly, splenomegaly, mass or pulsatile mass.  ?   Tenderness: There is abdominal tenderness in the periumbilical area and left upper quadrant. There is left CVA tenderness and guarding. There is no right CVA tenderness or rebound. Positive signs include psoas sign. Negative signs include Murphy's sign, McBurney's sign and obturator sign.  ?   Hernia: No hernia is present.  ?Genitourinary: ?   Comments: Urien sample negative for blood or evidence of infection or other abnormalities. ?Musculoskeletal:     ?   General: Normal range of motion.  ?   Cervical back: Normal range of motion and neck supple.  ?Lymphadenopathy:  ?   Cervical: No cervical adenopathy.  ?Skin: ?   General: Skin is warm and dry.  ?   Capillary Refill: Capillary refill takes less than 2 seconds.  ?Neurological:  ?   General: No focal deficit present.  ?   Mental Status: She is alert and oriented to person, place, and time.  ?Psychiatric:     ?   Mood and Affect: Mood normal.     ?   Behavior: Behavior normal.     ?   Thought Content: Thought content normal.     ?   Judgment: Judgment normal.  ?  ? ? ?Results for orders placed or performed in visit on 03/12/22  ?POCT URINALYSIS DIP ([facility])  ?Result Value Ref Range  ? Color, UA yellow yellow  ? Clarity, UA clear clear  ? Glucose, UA negative negative mg/dL  ? Bilirubin, UA negative negative  ? Ketones, POC UA negative negative mg/dL  ? Spec Grav, UA 1.025 1.010 - 1.025  ? Blood, UA negative negative  ? pH, UA 5.5 5.0 - 8.0  ? POC PROTEIN,UA negative negative, trace  ? Urobilinogen, UA 0.2 0.2 or 1.0 E.U./dL  ? Nitrite, UA Negative Negative  ? Leukocytes, UA Negative Negative  ? ? Assessment & Plan  ?  ? ?1. Left flank pain ?Urine sample clear of blood or evidence of infection at this time.  ?- POCT URINALYSIS DIP ([facility]) ? ?2. Left lower quadrant abdominal pain ?Patient with history of inflammatory/ulcerative colitis. Is on Lialda and sees Gi provider. Send for STAT  CT of abdomen and pelvis for further evaluation. Treat as indicated.  ?- CT Abdomen Pelvis W Contrast; Future ? ?3. Noninfectious gastroenteritis, unspecified type ?Patient with history of inflammatory/ulcerative colitis.  Will get STAT CT of abdomen and pelvis for further evaluation. Will refer as indicated  ?- CT Abdomen Pelvis W Contrast; Future  ? ?Return for prn worsening or persistent symptoms.  ?   ? ? ? ?Ronnell Freshwater, NP  ?[facility] ?[phone] (phone) ?[phone] (fax) ? ?[facility] ?

## 2022-03-13 ENCOUNTER — Telehealth: Payer: Self-pay | Admitting: Nurse Practitioner

## 2022-03-13 ENCOUNTER — Ambulatory Visit
Admission: RE | Admit: 2022-03-13 | Discharge: 2022-03-13 | Disposition: A | Discharge status: Home / Self Care | Payer: 59 | Source: Ambulatory Visit | Attending: Nurse Practitioner | Admitting: Nurse Practitioner

## 2022-03-13 DIAGNOSIS — R1032 Left lower quadrant pain: Secondary | ICD-10-CM | POA: Insufficient documentation

## 2022-03-13 DIAGNOSIS — K529 Noninfective gastroenteritis and colitis, unspecified: Secondary | ICD-10-CM

## 2022-03-13 DIAGNOSIS — R109 Unspecified abdominal pain: Secondary | ICD-10-CM | POA: Insufficient documentation

## 2022-03-13 MED ORDER — IOPAMIDOL (ISOVUE-300) INJECTION 61%
100.0000 mL | Freq: Once | INTRAVENOUS | Status: AC | PRN
  Administered 2022-03-13: 100 mL via INTRAVENOUS

## 2022-03-13 NOTE — Telephone Encounter (Signed)
Patient stating that her pain level is not an 8-10 and having spasms. Also radiating up to left shoulder. Please advise. [phone] ?

## 2022-03-13 NOTE — Progress Notes (Signed)
Patient experiencing increased left upper quadrant pain today. Spoke with patient over the phone on 03/13/2022 to discuss results of CT with her. Explained that there is a wedge shaped density which involves the inferior aspect of the spleen. There is concern that there may be infarct, however age cannot be determined. There is also evidence of development of peptic ulcer and low density seen in the wall of the right colon and proximal transverse colon. This is likely a result of ulcerative colitis. Due to the level of pain she is experiencing I did advise that she be seen in the ED for immediate evaluation and treatment. I also advised that she contact her GI provider as soon as possible to discuss CT result findings so that treatment can be adjusted as indicated. The patient voiced understanding of all instructions, though she is reluctant to be seen in ED. She did state that if she did need to go to the ED, that her GI provider had told her that he can be contacted by staff for him to consult on her case.

## 2022-03-14 ENCOUNTER — Emergency Department (HOSPITAL_COMMUNITY): Payer: 59

## 2022-03-14 ENCOUNTER — Encounter (HOSPITAL_COMMUNITY): Payer: Self-pay

## 2022-03-14 ENCOUNTER — Other Ambulatory Visit: Payer: Self-pay

## 2022-03-14 ENCOUNTER — Emergency Department (HOSPITAL_COMMUNITY)
Admission: EM | Admit: 2022-03-14 | Discharge: 2022-03-14 | Disposition: A | Discharge status: Home / Self Care | Payer: 59 | Attending: Emergency Medicine | Admitting: Emergency Medicine

## 2022-03-14 DIAGNOSIS — R197 Diarrhea, unspecified: Secondary | ICD-10-CM | POA: Diagnosis not present

## 2022-03-14 DIAGNOSIS — R110 Nausea: Secondary | ICD-10-CM | POA: Insufficient documentation

## 2022-03-14 DIAGNOSIS — R1012 Left upper quadrant pain: Secondary | ICD-10-CM | POA: Insufficient documentation

## 2022-03-14 DIAGNOSIS — D735 Infarction of spleen: Secondary | ICD-10-CM | POA: Insufficient documentation

## 2022-03-14 DIAGNOSIS — I10 Essential (primary) hypertension: Secondary | ICD-10-CM | POA: Diagnosis not present

## 2022-03-14 DIAGNOSIS — R109 Unspecified abdominal pain: Secondary | ICD-10-CM | POA: Diagnosis present

## 2022-03-14 HISTORY — DX: Infarction of spleen: D73.5

## 2022-03-14 LAB — CBC
HCT: 45.7 % (ref 36.0–46.0)
Hemoglobin: 14.8 g/dL (ref 12.0–15.0)
MCH: 29.4 pg (ref 26.0–34.0)
MCHC: 32.4 g/dL (ref 30.0–36.0)
MCV: 90.7 fL (ref 80.0–100.0)
Platelets: 201 10*3/uL (ref 150–400)
RBC: 5.04 MIL/uL (ref 3.87–5.11)
RDW: 12.6 % (ref 11.5–15.5)
WBC: 7.3 10*3/uL (ref 4.0–10.5)
nRBC: 0 % (ref 0.0–0.2)

## 2022-03-14 LAB — COMPREHENSIVE METABOLIC PANEL
ALT: 12 U/L (ref 0–44)
AST: 15 U/L (ref 15–41)
Albumin: 3.8 g/dL (ref 3.5–5.0)
Alkaline Phosphatase: 99 U/L (ref 38–126)
Anion gap: 11 (ref 5–15)
BUN: 8 mg/dL (ref 6–20)
CO2: 26 mmol/L (ref 22–32)
Calcium: 9.4 mg/dL (ref 8.9–10.3)
Chloride: 102 mmol/L (ref 98–111)
Creatinine, Ser: 0.92 mg/dL (ref 0.44–1.00)
GFR, Estimated: >60 mL/min (ref >60)
Glucose, Bld: 117 mg/dL — ABNORMAL HIGH (ref 70–99)
Potassium: 4.4 mmol/L (ref 3.5–5.1)
Sodium: 139 mmol/L (ref 135–145)
Total Bilirubin: 0.7 mg/dL (ref 0.3–1.2)
Total Protein: 7.1 g/dL (ref 6.5–8.1)

## 2022-03-14 LAB — PROTIME-INR
INR: 1 (ref 0.8–1.2)
Prothrombin Time: 13.4 seconds (ref 11.4–15.2)

## 2022-03-14 LAB — APTT: aPTT: 26 seconds (ref 24–36)

## 2022-03-14 LAB — D-DIMER, QUANTITATIVE: D-Dimer, Quant: 2.11 ug/mL-FEU — ABNORMAL HIGH (ref 0.00–0.50)

## 2022-03-14 LAB — LIPASE, BLOOD: Lipase: 29 U/L (ref 11–51)

## 2022-03-14 MED ORDER — TRAMADOL HCL 50 MG PO TABS: 50.0000 mg | ORAL_TABLET | Freq: Four times a day (QID) | ORAL | 0 refills | Status: DC | PRN

## 2022-03-14 MED ORDER — IOHEXOL 350 MG/ML SOLN
100.0000 mL | Freq: Once | INTRAVENOUS | Status: AC | PRN
  Administered 2022-03-14: 100 mL via INTRAVENOUS

## 2022-03-14 MED ORDER — FENTANYL CITRATE PF 50 MCG/ML IJ SOSY
25.0000 ug | PREFILLED_SYRINGE | Freq: Once | INTRAMUSCULAR | Status: AC
  Administered 2022-03-14: 25 ug via INTRAVENOUS
  Filled 2022-03-14: qty 1

## 2022-03-14 NOTE — ED Notes (Signed)
Dc instructions reviewed with pt. Pt verbalized understanding. Pt DC. 

## 2022-03-14 NOTE — Discharge Instructions (Addendum)
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  While you are here we repeated some scans with contrast which did not show any recurrent clot present.  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  We have talked with the vascular surgeon here who recommends that you start aspirin 81 mg daily.  Additionally he feels that you need a repeated echocardiogram and follow-up with your primary provider.  Please contact your primary care provider tomorrow or at your earliest convenience to set up an appointment for next week.  An echocardiogram of your heart needs to be scheduled and you likely need ongoing work-up of any underlying clotting disorders.  Please return to the emergency department if you have significantly worsening pain. ?

## 2022-03-14 NOTE — ED Triage Notes (Signed)
Pt c/o N/D, LLQ constant aching painx5 days. Pt states went to PCP and had abdominal CT done and PCP told her to come to ED. Pt states a low density wedge was shown on spleen and PCP wasn't sure what that meant. Pt denies vomiting. ?

## 2022-03-14 NOTE — ED Provider Notes (Signed)
?[facility] ?Provider Note ? ? ?CSN: [id number] ?Arrival date & time: 03/14/22  7353 ? ?  ? ?History ? ?Chief Complaint  ?Patient presents with  ? Abdominal Pain  ? ? ?Kathy Stephens is a 61 y.o. female.  With past medical history of GERD, hyperlipidemia, hypertension, IBS who presents to the emergency department from PCP for splenic infarct. ? ?States on Sunday she began having left-sided abdominal pain that she states initially felt like a stitch in her side.  She states that it progressively worsened and she had associated nausea without vomiting.  She states that she has also had some diarrhea.  Pain severely worsens when she takes a deep breath, coughs or sneezes.  States that she went to her PCP who ordered a CT and called her yesterday saying that she needed to be evaluated in the emergency department.  CT showed that she had a possible splenic infarct.  She denies any falls or trauma.  Denies having blood clots before, does not take blood thinners, no history of irregular heartbeat.  She has previously had a cholecystectomy and left ovarian removal. ? ? ?Abdominal Pain ?Associated symptoms: diarrhea and nausea   ?Associated symptoms: no fever, no shortness of breath and no vomiting   ? ?  ? ?Home Medications ?Prior to Admission medications   ?Medication Sig Start Date End Date Taking? Authorizing Provider  ?acetaminophen (TYLENOL) 500 MG tablet Take 500 mg by mouth as needed.    [provider]  ?cycloSPORINE (RESTASIS) 0.05 % ophthalmic emulsion 1 drop 2 (two) times daily.    [provider]  ?Doxylamine Succinate, Sleep, (SLEEP AID PO) Take 1 tablet by mouth at bedtime.    [provider]  ?mesalamine (LIALDA) 1.2 g EC tablet Take 1.2 g by mouth every other day.    [provider]  ?OMEPRAZOLE PO Take 20 mg by mouth daily.    [provider]  ?   ? ?Allergies    ?Prednisone and Azithromycin   ? ?Review of Systems   ?Review  of Systems  ?Constitutional:  Negative for fever.  ?Respiratory:  Negative for shortness of breath.   ?Gastrointestinal:  Positive for abdominal pain, diarrhea and nausea. Negative for blood in stool and vomiting.  ?All other systems reviewed and are negative. ? ?Physical Exam ?Updated Vital Signs ?BP (!) 188/106 (BP Location: Right Arm)   Pulse 84   Temp 98.7 ?F (37.1 ?C) (Oral)   Resp 16   Ht '5\' 3"'$  (1.6 m)   Wt 83.8 kg   LMP 11/03/2006   SpO2 98%   BMI 32.73 kg/m?  ?Physical Exam ?Vitals and nursing note reviewed.  ?Constitutional:   ?   General: She is not in acute distress. ?   Appearance: Normal appearance. She is well-developed. She is not ill-appearing or toxic-appearing.  ?HENT:  ?   Head: Normocephalic and atraumatic.  ?Eyes:  ?   General: No scleral icterus. ?   Extraocular Movements: Extraocular movements intact.  ?Cardiovascular:  ?   Rate and Rhythm: Normal rate and regular rhythm.  ?   Heart sounds: Normal heart sounds. No murmur heard. ?Pulmonary:  ?   Effort: Pulmonary effort is normal. No respiratory distress.  ?   Breath sounds: Normal breath sounds.  ?Abdominal:  ?   General: Abdomen is protuberant. Bowel sounds are decreased. There is no distension.  ?   Palpations: Abdomen is soft.  ?   Tenderness: There is abdominal  tenderness in the left upper quadrant. There is no right CVA tenderness or left CVA tenderness.  ?Musculoskeletal:     ?   General: Normal range of motion.  ?   Cervical back: Neck supple.  ?Skin: ?   General: Skin is warm and dry.  ?   Capillary Refill: Capillary refill takes less than 2 seconds.  ?   Findings: No bruising.  ?Neurological:  ?   General: No focal deficit present.  ?   Mental Status: She is alert and oriented to person, place, and time. Mental status is at baseline.  ?Psychiatric:     ?   Mood and Affect: Mood normal.     ?   Behavior: Behavior normal.     ?   Thought Content: Thought content normal.     ?   Judgment: Judgment normal.  ? ? ?ED Results /  Procedures / Treatments   ?Labs ?(all labs ordered are listed, but only abnormal results are displayed) ?Labs Reviewed  ?COMPREHENSIVE METABOLIC PANEL - Abnormal; Notable for the following components:  ?    Result Value  ? Glucose, Bld 117 (*)   ? All other components within normal limits  ?D-DIMER, QUANTITATIVE - Abnormal; Notable for the following components:  ? D-Dimer, Quant 2.11 (*)   ? All other components within normal limits  ?LIPASE, BLOOD  ?CBC  ?PROTIME-INR  ?APTT  ?URINALYSIS, ROUTINE W REFLEX MICROSCOPIC  ? ?EKG ?None ? ?Radiology ?CT Angio Chest PE W and/or Wo Contrast ? ?Result Date: 03/14/2022 ?CLINICAL DATA:  Pulmonary embolism suspected. EXAM: CT ANGIOGRAPHY CHEST WITH CONTRAST TECHNIQUE: Multidetector CT imaging of the chest was performed using the standard protocol during bolus administration of intravenous contrast. Multiplanar CT image reconstructions and MIPs were obtained to evaluate the vascular anatomy. RADIATION DOSE REDUCTION: This exam was performed according to the departmental dose-optimization program which includes automated exposure control, adjustment of the mA and/or kV according to patient size and/or use of iterative reconstruction technique. CONTRAST:  125m OMNIPAQUE IOHEXOL 350 MG/ML SOLN COMPARISON:  Exam from April 08, 2021. FINDINGS: Cardiovascular: Bovine arch. Normal caliber of the thoracic aorta. No periaortic stranding. Heart size top normal with small amount of fluid in the superior pericardial recess. No pericardial thickening. Central pulmonary vasculature is well opacified at 731 Hounsfield units. This study is negative for pulmonary embolism Mediastinum/Nodes: Thoracic inlet structures are normal. No axillary lymphadenopathy. No mediastinal lymphadenopathy. No hilar lymphadenopathy. Esophagus grossly normal. Lungs/Pleura: Basilar atelectasis. No effusion. No consolidation. Airways are patent. Upper Abdomen: Incidental imaging of upper abdominal contents shows  evidence of splenic infarct better seen on abdominal CT. Musculoskeletal: No acute bone finding. No destructive bone process. Spinal degenerative changes. Review of the MIP images confirms the above findings. IMPRESSION: 1. This study is negative for pulmonary embolism. 2. Basilar atelectasis. 3. Incidental imaging of upper abdominal contents shows evidence of splenic infarct better seen on abdominal CT. Electronically Signed   By: GZetta BillsM.D.   On: 03/14/2022 15:55  ? ?CT Abdomen Pelvis W Contrast ? ?Result Date: 03/13/2022 ?CLINICAL DATA:  Acute left lower quadrant abdominal pain. EXAM: CT ABDOMEN AND PELVIS WITH CONTRAST TECHNIQUE: Multidetector CT imaging of the abdomen and pelvis was performed using the standard protocol following bolus administration of intravenous contrast. RADIATION DOSE REDUCTION: This exam was performed according to the departmental dose-optimization program which includes automated exposure control, adjustment of the mA and/or kV according to patient size and/or use of iterative reconstruction technique. CONTRAST:  1031m  ISOVUE-300 IOPAMIDOL (ISOVUE-300) INJECTION 61% COMPARISON:  September 26, 2013. FINDINGS: Lower chest: No acute abnormality. Hepatobiliary: No focal liver abnormality is seen. Status post cholecystectomy. No biliary dilatation. Pancreas: Unremarkable. No pancreatic ductal dilatation or surrounding inflammatory changes. Spleen: wedge-shaped low density is noted involving inferior aspect of spleen concerning for infarction of indeterminate age. Adrenals/Urinary Tract: Adrenal glands are unremarkable. Kidneys are normal, without renal calculi, focal lesion, or hydronephrosis. Bladder is unremarkable. Stomach/Bowel: Moderate wall thickening of gastric antrum is noted concerning for peptic ulcer disease. There is no evidence of bowel obstruction or acute inflammation. Low density is seen in the wall of the right colon and proximal transverse colon most consistent with  chronic sequela of ulcerative colitis. The appendix is not clearly visualized. Vascular/Lymphatic: No significant vascular findings are present. No enlarged abdominal or pelvic lymph nodes. Reproductive: Uterus and bilater

## 2022-03-16 NOTE — Telephone Encounter (Signed)
I called and talked with patient before leaving the day you sent this message. I advised ER visit and she will contact her GI provider.

## 2022-03-19 ENCOUNTER — Ambulatory Visit
Admission: RE | Admit: 2022-03-19 | Discharge: 2022-03-19 | Disposition: A | Discharge status: Home / Self Care | Payer: 59 | Source: Ambulatory Visit | Attending: Nurse Practitioner | Admitting: Nurse Practitioner

## 2022-03-19 ENCOUNTER — Ambulatory Visit: Payer: 59 | Admitting: Nurse Practitioner

## 2022-03-19 ENCOUNTER — Encounter: Payer: Self-pay | Admitting: Nurse Practitioner

## 2022-03-19 VITALS — BP 134/79 | HR 77 | Temp 98.0°F | Ht 63.0 in | Wt 184.0 lb

## 2022-03-19 DIAGNOSIS — M87052 Idiopathic aseptic necrosis of left femur: Secondary | ICD-10-CM

## 2022-03-19 DIAGNOSIS — R109 Unspecified abdominal pain: Secondary | ICD-10-CM | POA: Diagnosis not present

## 2022-03-19 DIAGNOSIS — R161 Splenomegaly, not elsewhere classified: Secondary | ICD-10-CM | POA: Diagnosis not present

## 2022-03-19 DIAGNOSIS — I3139 Other pericardial effusion (noninflammatory): Secondary | ICD-10-CM | POA: Insufficient documentation

## 2022-03-19 DIAGNOSIS — D735 Infarction of spleen: Secondary | ICD-10-CM

## 2022-03-19 DIAGNOSIS — I517 Cardiomegaly: Secondary | ICD-10-CM | POA: Insufficient documentation

## 2022-03-19 HISTORY — DX: Idiopathic aseptic necrosis of left femur: M87.052

## 2022-03-19 NOTE — Progress Notes (Signed)
Established patient visit ? ? ?Patient: Kathy Stephens   DOB: 10/30/1961   61 y.o. Female  MRN: [id number] ?Visit Date: 03/19/2022 ? ? ?Chief Complaint  ?Patient presents with  ? Follow-up  ? ?Subjective  ?  ?HPI  ?Follow up left upper quadrant pain. Sent to ER when pain was worsening.  ?-D-Dimer was elevated  ?--CT angio chest showed: ?1. This study is negative for pulmonary embolism. ?2. Basilar atelectasis. ?3. Incidental imaging of upper abdominal contents shows evidence of ?splenic infarct better seen on abdominal CT. ?--CT angio abdomen and pelvis showed --- ?1. This study is negative for pulmonary embolism. ?2. Basilar atelectasis. ?3. Incidental imaging of upper abdominal contents shows evidence of ?splenic infarct better seen on abdominal CT. ?4. 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 ? ?-no acute clotting was noted. Patient has not had any accident resulting in splenic injury.  ?-ER provider most concerned about clot somewhere in the body. Recommend she have echocardiogram  ?-also concern for SLE. Patient does have family history of SLE.  ?-she does have chronic hip pain - CT of the abdomen and pelvis did show evidence of left AVM.  ?-she states that she does have improved abdominal /LUQ/LLQ pain.  ?-her GI was consulted while she was in the ER and he did not feel that any of this was a result of UC.  ? ? ?Was given prescription for PRN tramadol  ?Follow up recommended with GI and vascular surgery.  ? ?Medications: ?Outpatient Medications Prior to Visit  ?Medication Sig  ? acetaminophen (TYLENOL) 500 MG tablet Take 500 mg by mouth as needed.  ? cycloSPORINE (RESTASIS) 0.05 % ophthalmic emulsion 1 drop 2 (two) times daily.  ? Doxylamine Succinate, Sleep, (SLEEP AID PO) Take 1 tablet by mouth at bedtime.  ? mesalamine (LIALDA) 1.2 g EC tablet Take 1.2 g by mouth every other day.  ? OMEPRAZOLE PO Take 20 mg by mouth daily.  ? traMADol (ULTRAM) 50 MG tablet Take 1 tablet (50 mg  total) by mouth every 6 (six) hours as needed.  ? ?No facility-administered medications prior to visit.  ? ? ?Review of Systems  ?Constitutional:  Positive for appetite change and fatigue. Negative for activity change, chills and fever.  ?     The patient states that appetite is decreased and she generally has a robust appetite. She also feels very tired   ?HENT:  Negative for congestion, postnasal drip, rhinorrhea, sinus pressure, sinus pain, sneezing and sore throat.   ?Eyes: Negative.   ?Respiratory:  Negative for cough, chest tightness, shortness of breath and wheezing.   ?Cardiovascular:  Negative for chest pain and palpitations.  ?Gastrointestinal:  Positive for abdominal pain and nausea. Negative for constipation, diarrhea and vomiting.  ?Endocrine: Negative for cold intolerance, heat intolerance, polydipsia and polyuria.  ?Genitourinary:  Positive for flank pain. Negative for dyspareunia, dysuria, frequency and urgency.  ?Musculoskeletal:  Negative for arthralgias, back pain and myalgias.  ?Skin:  Negative for rash.  ?Allergic/Immunologic: Negative for environmental allergies.  ?Neurological:  Negative for dizziness, weakness and headaches.  ?Hematological:  Negative for adenopathy.  ?Psychiatric/Behavioral:  The patient is not nervous/anxious.   ? ?Last CBC ?Lab Results  ?Component Value Date  ? WBC 7.3 03/14/2022  ? HGB 14.8 03/14/2022  ? HCT 45.7 03/14/2022  ? MCV 90.7 03/14/2022  ? MCH 29.4 03/14/2022  ? RDW 12.6 03/14/2022  ? PLT 201 03/14/2022  ? ?Last metabolic panel ?Lab Results  ?  Component Value Date  ? GLUCOSE 117 (H) 03/14/2022  ? NA 139 03/14/2022  ? K 4.4 03/14/2022  ? CL 102 03/14/2022  ? CO2 26 03/14/2022  ? BUN 8 03/14/2022  ? CREATININE 0.92 03/14/2022  ? GFRNONAA >60 03/14/2022  ? CALCIUM 9.4 03/14/2022  ? PROT 7.1 03/14/2022  ? ALBUMIN 3.8 03/14/2022  ? LABGLOB 2.6 04/09/2021  ? AGRATIO 1.7 04/09/2021  ? BILITOT 0.7 03/14/2022  ? ALKPHOS 99 03/14/2022  ? AST 15 03/14/2022  ? ALT 12  03/14/2022  ? ANIONGAP 11 03/14/2022  ? ?  ? ? Objective  ?  ? ?Today's Vitals  ? 03/19/22 0953  ?BP: 134/79  ?Pulse: 77  ?Temp: 98 ?F (36.7 ?C)  ?SpO2: 98%  ?Weight: 184 lb (83.5 kg)  ?Height: 5' 3"  (1.6 m)  ? ?Body mass index is 32.59 kg/m?.  ? ?BP Readings from Last 3 Encounters:  ?03/19/22 134/79  ?03/14/22 (!) 189/79  ?03/12/22 122/78  ?  ?Wt Readings from Last 3 Encounters:  ?03/19/22 184 lb (83.5 kg)  ?03/14/22 185 lb (83.9 kg)  ?03/12/22 184 lb 12.8 oz (83.8 kg)  ?  ?Physical Exam ?Vitals and nursing note reviewed.  ?Constitutional:   ?   Appearance: Normal appearance. She is well-developed.  ?HENT:  ?   Head: Normocephalic and atraumatic.  ?Eyes:  ?   Pupils: Pupils are equal, round, and reactive to light.  ?Cardiovascular:  ?   Rate and Rhythm: Normal rate and regular rhythm.  ?   Pulses: Normal pulses.  ?   Heart sounds: Normal heart sounds.  ?Pulmonary:  ?   Effort: Pulmonary effort is normal.  ?   Breath sounds: Normal breath sounds.  ?Abdominal:  ?   General: Bowel sounds are decreased.  ?   Palpations: Abdomen is soft. There is splenomegaly. There is no shifting dullness, fluid wave, hepatomegaly, mass or pulsatile mass.  ?   Tenderness: There is generalized abdominal tenderness and tenderness in the left upper quadrant and left lower quadrant. There is left CVA tenderness.  ?   Comments: Improved abdominal tenderness from last visit   ?Musculoskeletal:     ?   General: Normal range of motion.  ?   Cervical back: Normal range of motion and neck supple.  ?Lymphadenopathy:  ?   Cervical: No cervical adenopathy.  ?Skin: ?   General: Skin is warm and dry.  ?   Capillary Refill: Capillary refill takes less than 2 seconds.  ?Neurological:  ?   General: No focal deficit present.  ?   Mental Status: She is alert and oriented to person, place, and time.  ?Psychiatric:     ?   Mood and Affect: Mood normal.     ?   Behavior: Behavior normal.     ?   Thought Content: Thought content normal.     ?   Judgment:  Judgment normal.  ?  ? ? Assessment & Plan  ?  ?1. Left flank pain ?Improved left flank pain. Reviewed ER records.  ? 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 Thought to be from auto-immune connective tissue process or possible clotting disorder. Check connective tissue panel. Refer to rheumatology or hematology as indicated.  ?- ANA w/Reflex if Positive; Future ?- Sedimentation rate; Future ?- CBC with Differential/Platelet; Future ?- Comp Met (CMET); Future ?- Rheumatoid Factor; Future ?- Rheumatoid Factor ?- Comp Met (CMET) ?- CBC with Differential/Platelet ?- Sedimentation rate ?- ANA w/Reflex if Positive ? ?3. Splenomegaly ?Likely secondary  to splenic infarct with unclear etiology. Checking for connective tissue and auto immune panel for further evaluation. Consider referral to rheumatology or hematology as indicated  ?- ANA w/Reflex if Positive; Future ?- Sedimentation rate; Future ?- CBC with Differential/Platelet; Future ?- Comp Met (CMET); Future ?- Rheumatoid Factor; Future ?- Rheumatoid Factor ?- Comp Met (CMET) ?- CBC with Differential/Platelet ?- Sedimentation rate ?- ANA w/Reflex if Positive ? ?4. 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 Will get x-ray of hip for further evaluation. Refer to orthopedics as idicated.  ?- DG Hip Unilat W OR W/O Pelvis 2-3 Views Left; Future ? ?5. Pericardial effusion ?CT chest showing mild cardiomegaly with possible pericardial effusion. Will get echocardiogram for further evaluation  ?- ECHOCARDIOGRAM COMPLETE; Future ? ?6. Cardiomegaly ?Ct chest showing mild cardiomegaly. Echocardiogram ordered for further evaluation.  ?- ECHOCARDIOGRAM COMPLETE; Future  ? ? ?Problem List Items Addressed This Visit   ? ?  ? Cardiovascular and Mediastinum  ? Pericardial effusion  ? Relevant Orders  ? ECHOCARDIOGRAM COMPLETE  ? Cardiomegaly  ? Relevant Orders  ? ECHOCARDIOGRAM COMPLETE  ?  ? Musculoskeletal and  Integument  ? 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 Relevant Orders  ? DG Hip Unilat W OR W/O Pelvis 2-3 Views Left  ?  ? Other  ? Left flank pain - Primary  ? Splenic infarct  ? Relevant Orders  ? ANA w/Reflex if P

## 2022-03-20 LAB — CBC WITH DIFFERENTIAL/PLATELET
Basophils Absolute: 0.1 10*3/uL (ref 0.0–0.2)
Basos: 1 %
EOS (ABSOLUTE): 0.1 10*3/uL (ref 0.0–0.4)
Eos: 2 %
Hematocrit: 44.1 % (ref 34.0–46.6)
Hemoglobin: 14.9 g/dL (ref 11.1–15.9)
Immature Grans (Abs): 0 10*3/uL (ref 0.0–0.1)
Immature Granulocytes: 0 %
Lymphocytes Absolute: 1.7 10*3/uL (ref 0.7–3.1)
Lymphs: 24 %
MCH: 29.9 pg (ref 26.6–33.0)
MCHC: 33.8 g/dL (ref 31.5–35.7)
MCV: 88 fL (ref 79–97)
Monocytes Absolute: 0.4 10*3/uL (ref 0.1–0.9)
Monocytes: 6 %
Neutrophils Absolute: 4.7 10*3/uL (ref 1.4–7.0)
Neutrophils: 67 %
Platelets: 219 10*3/uL (ref 150–450)
RBC: 4.99 x10E6/uL (ref 3.77–5.28)
RDW: 12.1 % (ref 11.7–15.4)
WBC: 7.1 10*3/uL (ref 3.4–10.8)

## 2022-03-20 LAB — COMPREHENSIVE METABOLIC PANEL
ALT: 7 IU/L (ref 0–32)
AST: 11 IU/L (ref 0–40)
Albumin/Globulin Ratio: 1.5 (ref 1.2–2.2)
Albumin: 4.2 g/dL (ref 3.8–4.9)
Alkaline Phosphatase: 126 IU/L — ABNORMAL HIGH (ref 44–121)
BUN/Creatinine Ratio: 8 — ABNORMAL LOW (ref 9–23)
BUN: 8 mg/dL (ref 6–24)
Bilirubin Total: 0.2 mg/dL (ref 0.0–1.2)
CO2: 24 mmol/L (ref 20–29)
Calcium: 9.2 mg/dL (ref 8.7–10.2)
Chloride: 102 mmol/L (ref 96–106)
Creatinine, Ser: 0.99 mg/dL (ref 0.57–1.00)
Globulin, Total: 2.8 g/dL (ref 1.5–4.5)
Glucose: 79 mg/dL (ref 70–99)
Potassium: 4.6 mmol/L (ref 3.5–5.2)
Sodium: 140 mmol/L (ref 134–144)
Total Protein: 7 g/dL (ref 6.0–8.5)
eGFR: 66 mL/min/{1.73_m2} (ref >59)

## 2022-03-20 LAB — SEDIMENTATION RATE: Sed Rate: 46 mm/hr — ABNORMAL HIGH (ref 0–40)

## 2022-03-20 LAB — RHEUMATOID FACTOR: Rheumatoid fact SerPl-aCnc: 10 IU/mL (ref <14.0)

## 2022-03-20 LAB — ANA W/REFLEX IF POSITIVE: Anti Nuclear Antibody (ANA): NEGATIVE

## 2022-03-21 ENCOUNTER — Other Ambulatory Visit: Payer: Self-pay | Admitting: Nurse Practitioner

## 2022-03-21 DIAGNOSIS — M87052 Idiopathic aseptic necrosis of left femur: Secondary | ICD-10-CM

## 2022-03-21 DIAGNOSIS — M25552 Pain in left hip: Secondary | ICD-10-CM

## 2022-03-21 NOTE — Progress Notes (Signed)
Refer to orthopedics due to avascular necrosis of left hip and chronic left hip pain.

## 2022-03-21 NOTE — Progress Notes (Signed)
This goes with her previous message. The x-ray does show avascular necrosis of the left hip. I have placed referral to orthopedics for her so this can be evaluated further.  Thanks so much.   -HB

## 2022-03-21 NOTE — Progress Notes (Signed)
Please let the patient know that her labs are back. Overall, everything looks good. She does show some signes of inflammation with the elevated Sed rate and alkaline phosphatase. But the rheumatoid factor and ANA, both would tell us about auto-immune processes were negative. Waiting on echo and x-ray results.  Thanks so much.   -HB

## 2022-04-01 ENCOUNTER — Telehealth: Payer: Self-pay | Admitting: Nurse Practitioner

## 2022-04-01 NOTE — Telephone Encounter (Signed)
Patient is asking was the right test ordered to check for blood clots? She just wants to be sure.

## 2022-04-01 NOTE — Telephone Encounter (Signed)
Pt was advised that the test was ordered for Inflammation not blood clots

## 2022-04-02 ENCOUNTER — Ambulatory Visit (INDEPENDENT_AMBULATORY_CARE_PROVIDER_SITE_OTHER): Payer: 59 | Admitting: Nurse Practitioner

## 2022-04-02 ENCOUNTER — Encounter: Payer: Self-pay | Admitting: Nurse Practitioner

## 2022-04-02 ENCOUNTER — Telehealth: Payer: Self-pay | Admitting: Oncology

## 2022-04-02 VITALS — BP 134/83 | HR 68 | Temp 98.4°F | Ht 62.99 in | Wt 184.4 lb

## 2022-04-02 DIAGNOSIS — R161 Splenomegaly, not elsewhere classified: Secondary | ICD-10-CM

## 2022-04-02 DIAGNOSIS — D735 Infarction of spleen: Secondary | ICD-10-CM | POA: Diagnosis not present

## 2022-04-02 DIAGNOSIS — R109 Unspecified abdominal pain: Secondary | ICD-10-CM

## 2022-04-02 DIAGNOSIS — M87052 Idiopathic aseptic necrosis of left femur: Secondary | ICD-10-CM | POA: Diagnosis not present

## 2022-04-02 NOTE — Telephone Encounter (Signed)
Scheduled appt per 5/30 referral . Pt is aware of appt date and time. Pt is aware to arrive 15 mins prior to appt time and to bring and updated insurance card. Pt is aware of appt location.   

## 2022-04-02 NOTE — Progress Notes (Signed)
Established patient visit   Patient: Kathy Stephens   DOB: April 13, 1961   61 y.o. Female  MRN: [id number] Visit Date: 04/02/2022   Chief Complaint  Patient presents with   Follow-up   Subjective    HPI  Follow up left upper quadrant pain. Sent to ER when pain was worsening.  -D-Dimer was elevated  --CT angio chest showed: 1. This study is negative for pulmonary embolism. 2. Basilar atelectasis. 3. Incidental imaging of upper abdominal contents shows evidence of splenic infarct better seen on abdominal CT.  --CT angio abdomen and pelvis showed --- 1. This study is negative for pulmonary embolism. 2. Basilar atelectasis. 3. Incidental imaging of upper abdominal contents shows evidence of splenic infarct better seen on abdominal CT. 4. Evidence of left AVN present with degenerative changes of the spine .   -no acute clotting was noted. Patient has not had any accident resulting in splenic injury.  -ER provider most concerned about clot somewhere in the body. Recommend she have echocardiogram  -also concern for SLE. Patient does have family history of SLE.  -she does have chronic hip pain - CT of the abdomen and pelvis did show evidence of left AVM.  -she states that she does have improved abdominal /LUQ/LLQ pain.  -her GI was consulted while she was in the ER and he did not feel that any of this was a result of UC.   Since recent visit, she has had x-ray of her hip which did confirm diagnosis of avascular necrosis of the left hip. She has been referred to orthopedics. She also has echocardiogram scheduled for 04/04/2022.   Labs done at prior visit did show elevated sed rate but screening for auto immune disorders or rheumatoid disease was negative.     Was given prescription for PRN tramadol  Follow up recommended with GI and vascular surgery.   The patient reports improved pain. States that pain is around 2 or 3. Has not taken tramadol since initial visit to ER.     Medications: Outpatient Medications Prior to Visit  Medication Sig   acetaminophen (TYLENOL) 500 MG tablet Take 500 mg by mouth as needed.   cycloSPORINE (RESTASIS) 0.05 % ophthalmic emulsion 1 drop 2 (two) times daily.   Doxylamine Succinate, Sleep, (SLEEP AID PO) Take 1 tablet by mouth at bedtime.   mesalamine (LIALDA) 1.2 g EC tablet Take 1.2 g by mouth every other day.   OMEPRAZOLE PO Take 20 mg by mouth daily.   traMADol (ULTRAM) 50 MG tablet Take 1 tablet (50 mg total) by mouth every 6 (six) hours as needed.   No facility-administered medications prior to visit.    Review of Systems  Constitutional:  Positive for appetite change and fatigue. Negative for activity change, chills and fever.       Appetite slightly improved since her last recent visit.   HENT:  Negative for congestion, postnasal drip, rhinorrhea, sinus pressure, sinus pain, sneezing and sore throat.   Eyes: Negative.   Respiratory:  Negative for cough, chest tightness, shortness of breath and wheezing.   Cardiovascular:  Negative for chest pain and palpitations.  Gastrointestinal:  Positive for abdominal pain and nausea. Negative for constipation, diarrhea and vomiting.  Endocrine: Negative for cold intolerance, heat intolerance, polydipsia and polyuria.  Genitourinary:  Positive for flank pain. Negative for dyspareunia, dysuria, frequency and urgency.  Musculoskeletal:  Positive for arthralgias. Negative for back pain and myalgias.  Skin:  Negative for rash.  Patient has noted rough and scaly patches on the skin. States that this has started since her last visit with me.   Allergic/Immunologic: Negative for environmental allergies.  Neurological:  Negative for dizziness, weakness and headaches.  Hematological:  Negative for adenopathy.  Psychiatric/Behavioral:  The patient is not nervous/anxious.    Last CBC Lab Results  Component Value Date   WBC 7.1 03/19/2022   HGB 14.9 03/19/2022   HCT 44.1  03/19/2022   MCV 88 03/19/2022   MCH 29.9 03/19/2022   RDW 12.1 03/19/2022   PLT 219 60/73/7106   Last metabolic panel Lab Results  Component Value Date   GLUCOSE 79 03/19/2022   NA 140 03/19/2022   K 4.6 03/19/2022   CL 102 03/19/2022   CO2 24 03/19/2022   BUN 8 03/19/2022   CREATININE 0.99 03/19/2022   EGFR 66 03/19/2022   CALCIUM 9.2 03/19/2022   PROT 7.0 03/19/2022   ALBUMIN 4.2 03/19/2022   LABGLOB 2.8 03/19/2022   AGRATIO 1.5 03/19/2022   BILITOT 0.2 03/19/2022   ALKPHOS 126 (H) 03/19/2022   AST 11 03/19/2022   ALT 7 03/19/2022   ANIONGAP 11 03/14/2022   Last lipids Lab Results  Component Value Date   CHOL 259 (H) 03/29/2021   HDL 56 03/29/2021   LDLCALC 169 (H) 03/29/2021   TRIG 185 (H) 03/29/2021   CHOLHDL 4.6 (H) 03/29/2021    Last thyroid functions Lab Results  Component Value Date   TSH 2.110 04/09/2021       Objective     Today's Vitals   04/02/22 0831  BP: 134/83  Pulse: 68  Temp: 98.4 F (36.9 C)  SpO2: 99%  Weight: 184 lb 6.4 oz (83.6 kg)  Height: 5' 2.99" (1.6 m)   Body mass index is 32.67 kg/m.   BP Readings from Last 3 Encounters:  04/02/22 134/83  03/19/22 134/79  03/14/22 (!) 189/79    Wt Readings from Last 3 Encounters:  04/02/22 184 lb 6.4 oz (83.6 kg)  03/19/22 184 lb (83.5 kg)  03/14/22 185 lb (83.9 kg)    Physical Exam Vitals and nursing note reviewed.  Constitutional:      Appearance: Normal appearance. She is well-developed.  HENT:     Head: Normocephalic and atraumatic.  Eyes:     Pupils: Pupils are equal, round, and reactive to light.  Cardiovascular:     Rate and Rhythm: Normal rate and regular rhythm.     Pulses: Normal pulses.     Heart sounds: Normal heart sounds.  Pulmonary:     Effort: Pulmonary effort is normal.     Breath sounds: Normal breath sounds.  Abdominal:     General: Bowel sounds are normal. There is no distension.     Palpations: Abdomen is soft. There is no mass.     Tenderness:  There is abdominal tenderness. There is left CVA tenderness. There is no right CVA tenderness, guarding or rebound.     Hernia: No hernia is present.     Comments: Slightly improved abdominal tenderness in left flank area. No guarding or grimacing noted   Musculoskeletal:        General: Normal range of motion.     Cervical back: Normal range of motion and neck supple.     Comments: Left hip tenderness  Right elbow tenderness   Lymphadenopathy:     Cervical: No cervical adenopathy.  Skin:    General: Skin is warm and dry.     Capillary  Refill: Capillary refill takes less than 2 seconds.  Neurological:     General: No focal deficit present.     Mental Status: She is alert and oriented to person, place, and time.  Psychiatric:        Mood and Affect: Mood normal.        Behavior: Behavior normal.        Thought Content: Thought content normal.        Judgment: Judgment normal.      Assessment & Plan    1. Left flank pain This is likely from splenic infarct recently found on CT abdomen and pelvis. This is improving.   2. Splenic infarct Unclear etiology. Patient has had elevated D. Dimer. Recent labs showing elevated sed rate. Auto immune and connective tissue panel negative. Will refer to hematology and vascular surgery for further evaluation.  - Ambulatory referral to Hematology / Oncology - Ambulatory referral to Vascular Surgery  3. Splenomegaly Refer to hematology and vascular surgery for further evaluation.  - Ambulatory referral to Hematology / Oncology - Ambulatory referral to Vascular Surgery  4. Avascular necrosis of left femur [facility]) Patient will see orthopedics for this on 04/04/2022. Will refer to vascular surgery to investigate cause  - Ambulatory referral to Vascular Surgery   Problem List Items Addressed This Visit       Musculoskeletal and Integument   Avascular necrosis of left femur [facility])   Relevant Orders   Ambulatory referral to Vascular Surgery      Other   Left flank pain - Primary   Splenic infarct   Relevant Orders   Ambulatory referral to Hematology / Oncology   Ambulatory referral to Vascular Surgery   Splenomegaly   Relevant Orders   Ambulatory referral to Hematology / Oncology   Ambulatory referral to Vascular Surgery     Return in about 4 weeks (around 04/30/2022) for review echo and progress notes from other providers. Marland Kitchen         Ronnell Freshwater, NP  [facility] [phone] (phone) [phone] (fax)  [place]

## 2022-04-03 ENCOUNTER — Encounter: Payer: Self-pay | Admitting: Nurse Practitioner

## 2022-04-03 ENCOUNTER — Ambulatory Visit (HOSPITAL_COMMUNITY)
Admission: RE | Admit: 2022-04-03 | Discharge: 2022-04-03 | Disposition: A | Discharge status: Home / Self Care | Payer: 59 | Source: Ambulatory Visit | Attending: Nurse Practitioner | Admitting: Nurse Practitioner

## 2022-04-03 DIAGNOSIS — I3139 Other pericardial effusion (noninflammatory): Secondary | ICD-10-CM | POA: Insufficient documentation

## 2022-04-03 DIAGNOSIS — I517 Cardiomegaly: Secondary | ICD-10-CM | POA: Diagnosis not present

## 2022-04-03 DIAGNOSIS — D735 Infarction of spleen: Secondary | ICD-10-CM | POA: Insufficient documentation

## 2022-04-03 LAB — ECHOCARDIOGRAM COMPLETE
Area-P 1/2: 3.48 cm2
S' Lateral: 2.5 cm

## 2022-04-04 ENCOUNTER — Other Ambulatory Visit: Payer: Self-pay

## 2022-04-04 ENCOUNTER — Ambulatory Visit: Payer: 59 | Admitting: Orthopaedic Surgery

## 2022-04-04 ENCOUNTER — Encounter: Payer: Self-pay | Admitting: Orthopaedic Surgery

## 2022-04-04 VITALS — Ht 63.0 in | Wt 184.0 lb

## 2022-04-04 DIAGNOSIS — M545 Low back pain, unspecified: Secondary | ICD-10-CM | POA: Diagnosis not present

## 2022-04-04 DIAGNOSIS — M25552 Pain in left hip: Secondary | ICD-10-CM

## 2022-04-04 DIAGNOSIS — G8929 Other chronic pain: Secondary | ICD-10-CM

## 2022-04-04 NOTE — Progress Notes (Signed)
Office Visit Note   Patient: Kathy Stephens           Date of Birth: 10/06/1961           MRN: [id number] Visit Date: 04/04/2022              Requested by: Ronnell Freshwater, NP [place],  [place] [id number] PCP: Ronnell Freshwater, NP   Assessment & Plan: Visit Diagnoses:  1. Chronic bilateral low back pain without sciatica   2. Pain in left hip     Plan: Impression is chronic low back pain and left hip pain.  I went over the CT scan with Kathy Stephens.  She does have a large subchondral cyst in the femoral head but I am not sure if this is avascular necrosis or just DJD.  Treatment options were reviewed and we will send her to PT for her back and her hip.  She is interested in trying a cortisone injection in the left hip as well.  Follow-Up Instructions: No follow-ups on file.   Orders:  Orders Placed This Encounter  Procedures   Ambulatory referral to Physical Therapy   No orders of the defined types were placed in this encounter.     Procedures: No procedures performed   Clinical Data: No additional findings.   Subjective: Chief Complaint  Patient presents with   Right Elbow - Pain   Left Hip - Pain    HPI Kathy Stephens is a 61 year old female here for chronic left hip and low back pain.  She has had chronic back and hip pain her entire life.  Recently had a CT scan for splenic infarction and was found incidentally to have left hip avascular necrosis.  She cannot take NSAIDs due to inflammatory colitis.  She has noticed significant decline in quality of life due to the pain.  Review of Systems  Constitutional: Negative.   HENT: Negative.    Eyes: Negative.   Respiratory: Negative.    Cardiovascular: Negative.   Endocrine: Negative.   Musculoskeletal: Negative.   Neurological: Negative.   Hematological: Negative.   Psychiatric/Behavioral: Negative.    All other systems reviewed and are negative.   Objective: Vital Signs: Ht '5\' 3"'$  (1.6 m)   Wt  184 lb (83.5 kg)   LMP 11/03/2006   BMI 32.59 kg/m   Physical Exam Vitals and nursing note reviewed.  Constitutional:      Appearance: She is well-developed.  HENT:     Head: Normocephalic and atraumatic.  Pulmonary:     Effort: Pulmonary effort is normal.  Abdominal:     Palpations: Abdomen is soft.  Musculoskeletal:     Cervical back: Neck supple.  Skin:    General: Skin is warm.     Capillary Refill: Capillary refill takes less than 2 seconds.  Neurological:     Mental Status: She is alert and oriented to person, place, and time.  Psychiatric:        Behavior: Behavior normal.        Thought Content: Thought content normal.        Judgment: Judgment normal.    Ortho Exam Examination of left hip shows normal range of motion without significant pain.  Good hip flexion strength.  Low back is slightly tender to palpation.  No sciatic tension signs.  Lateral hip is nontender. Specialty Comments:  No specialty comments available.  Imaging: ECHOCARDIOGRAM COMPLETE  Result Date: 04/03/2022    ECHOCARDIOGRAM REPORT  Patient Name:   Kathy Stephens Date of Exam: 04/03/2022 Medical Rec #:  [id number]      Height:       63.0 in Accession #:    [id number]     Weight:       184.4 lb Date of Birth:  [birth year]     BSA:          1.868 m Patient Age:    89 years       BP:           134/83 mmHg Patient Gender: F              HR:           64 bpm. Exam Location:  [facility] Procedure: 2D Echo, 3D Echo, Cardiac Doppler, Color Doppler and Strain Analysis Indications:     Splenic Infarct; Cardiomegaly I51.7  History:         Patient has prior history of Echocardiogram examinations, most                  recent 03/21/2021.  Sonographer:     Mikki Santee RDCS Referring Phys:  [id number] Ronnell Freshwater Diagnosing Phys: Vernell Leep MD IMPRESSIONS  1. Left ventricular ejection fraction, by estimation, is 60 to 65%. The left ventricle has normal function. The left ventricle has no regional wall  motion abnormalities. There is mild left ventricular hypertrophy. Left ventricular diastolic parameters were normal.  2. Right ventricular systolic function is normal. The right ventricular size is normal.  3. The mitral valve is normal in structure. No evidence of mitral valve regurgitation. No evidence of mitral stenosis.  4. The aortic valve is normal in structure. Aortic valve regurgitation is not visualized. No aortic stenosis is present.  5. The inferior vena cava is normal in size with greater than 50% respiratory variability, suggesting right atrial pressure of 3 mmHg. FINDINGS  Left Ventricle: Left ventricular ejection fraction, by estimation, is 60 to 65%. The left ventricle has normal function. The left ventricle has no regional wall motion abnormalities. The left ventricular internal cavity size was normal in size. There is  mild left ventricular hypertrophy. Left ventricular diastolic parameters were normal. Right Ventricle: The right ventricular size is normal. No increase in right ventricular wall thickness. Right ventricular systolic function is normal. Left Atrium: Left atrial size was normal in size. Right Atrium: Right atrial size was normal in size. Pericardium: There is no evidence of pericardial effusion. Mitral Valve: The mitral valve is normal in structure. No evidence of mitral valve regurgitation. No evidence of mitral valve stenosis. Tricuspid Valve: The tricuspid valve is normal in structure. Tricuspid valve regurgitation is not demonstrated. No evidence of tricuspid stenosis. Aortic Valve: The aortic valve is normal in structure. Aortic valve regurgitation is not visualized. No aortic stenosis is present. Pulmonic Valve: The pulmonic valve was normal in structure. Pulmonic valve regurgitation is not visualized. No evidence of pulmonic stenosis. Aorta: The aortic root is normal in size and structure. Venous: The inferior vena cava is normal in size with greater than 50% respiratory  variability, suggesting right atrial pressure of 3 mmHg. IAS/Shunts: The interatrial septum was not assessed.  LEFT VENTRICLE PLAX 2D LVIDd:         4.00 cm   Diastology LVIDs:         2.50 cm   LV e' medial:    6.38 cm/s LV PW:         1.10 cm  LV E/e' medial:  14.4 LV IVS:        1.20 cm   LV e' lateral:   9.90 cm/s LVOT diam:     2.00 cm   LV E/e' lateral: 9.3 LV SV:         61 LV SV Index:   33        2D Longitudinal Strain LVOT Area:     3.14 cm  2D Strain GLS Avg:     -16.8 %                           3D Volume EF:                          3D EF:        61 %                          LV EDV:       96 ml                          LV ESV:       37 ml                          LV SV:        59 ml RIGHT VENTRICLE RV Basal diam:  2.90 cm RV S prime:     10.70 cm/s TAPSE (M-mode): 1.5 cm LEFT ATRIUM             Index        RIGHT ATRIUM           Index LA diam:        3.60 cm 1.93 cm/m   RA Area:     11.40 cm LA Vol (A2C):   52.0 ml 27.84 ml/m  RA Volume:   23.40 ml  12.53 ml/m LA Vol (A4C):   38.0 ml 20.34 ml/m LA Biplane Vol: 45.0 ml 24.09 ml/m  AORTIC VALVE LVOT Vmax:   92.05 cm/s LVOT Vmean:  57.900 cm/s LVOT VTI:    0.196 m  AORTA Ao Root diam: 2.70 cm Ao Asc diam:  3.40 cm MITRAL VALVE MV Area (PHT): 3.48 cm    SHUNTS MV Decel Time: 218 msec    Systemic VTI:  0.20 m MV E velocity: 91.70 cm/s  Systemic Diam: 2.00 cm MV A velocity: 69.20 cm/s MV E/A ratio:  1.33 Manish Patwardhan MD Electronically signed by Vernell Leep MD Signature Date/Time: 04/03/2022/2:46:14 PM    Final      PMFS History: Patient Active Problem List   Diagnosis Date Noted   Chronic bilateral low back pain without sciatica 04/04/2022   Pain in left hip 04/04/2022   Splenic infarct 03/19/2022   Splenomegaly 03/19/2022   Avascular necrosis of left femur ([place]) 03/19/2022   Pericardial effusion 03/19/2022   Cardiomegaly 03/19/2022   Left flank pain 03/13/2022   Left lower quadrant abdominal pain 03/13/2022   Well woman  exam 05/20/2021   Body mass index (BMI) of 32.0-32.9 in adult 05/20/2021   Chest pressure 04/10/2021   Noninfectious gastroenteritis 04/09/2021   Colon cancer screening 04/09/2021   Diarrhea 04/09/2021   Costochondritis 04/09/2021   Primary hypertension 04/09/2021   Flatulence, eructation and gas pain 04/09/2021   Gastroesophageal reflux disease 04/09/2021  Generalized abdominal pain 04/09/2021   Nausea 04/09/2021   Obesity due to excess calories 34/19/3790   Periumbilical pain 24/07/7352   Encounter to establish care 04/09/2021   Fibromyalgia 04/09/2021   Vitamin D deficiency 04/09/2021   Encounter for screening mammogram for malignant neoplasm of breast 04/09/2021   Healthcare maintenance 04/09/2021   Cigarette smoker 03/11/2021   Chest pain 03/10/2021   Family history of early CAD 03/10/2021   Right hip pain 12/11/2016   Mixed hyperlipidemia 02/03/2013   Past Medical History:  Diagnosis Date   Allergy    Anemia    Anxiety    Cancer ([place])    skin (nose & face)   Depression    Fibromyalgia    Fibromyalgia 04/09/2021   GERD (gastroesophageal reflux disease)    Inflammatory bowel disease (ulcerative colitis) ([place])    Migraines    Osteopenia     Family History  Problem Relation Age of Onset   Breast cancer Mother 50   Hypertension Mother    Cancer Mother    Stroke Mother    Cancer Maternal Grandfather    Heart disease Maternal Grandfather    Heart disease Paternal Grandmother    Colon cancer Paternal Grandfather    Cancer Paternal Grandfather        testicular   Cancer Father        associated with colon polyps   Heart disease Father    Hyperlipidemia Father    Breast cancer Maternal Aunt     Past Surgical History:  Procedure Laterality Date   CESAREAN SECTION     CHOLECYSTECTOMY  03/19/2012   Procedure: LAPAROSCOPIC CHOLECYSTECTOMY;  Surgeon: Gwenyth Ober, MD;  Location: [facility] OR;  Service: General;  Laterality: N/A;   FOOT SURGERY  1980's   LAPAROSCOPY      with cystectomy   LEFT OOPHORECTOMY     tendon relief     TUBAL LIGATION     Social History   Occupational History   Not on file  Tobacco Use   Smoking status: Every Day    Packs/day: 0.80    Years: 15.00    Pack years: 12.00    Types: Cigarettes, Cigars   Smokeless tobacco: Never  Vaping Use   Vaping Use: Never used  Substance and Sexual Activity   Alcohol use: Yes    Alcohol/week: 0.0 - 1.0 standard drinks    Comment: socially   Drug use: No   Sexual activity: Yes    Partners: Male    Birth control/protection: Surgical    Comment: BTL

## 2022-04-14 ENCOUNTER — Encounter: Payer: Self-pay | Admitting: Family Medicine

## 2022-04-16 ENCOUNTER — Ambulatory Visit: Payer: 59 | Admitting: Physical Therapy

## 2022-04-17 ENCOUNTER — Other Ambulatory Visit: Payer: Self-pay | Admitting: Lab

## 2022-04-17 ENCOUNTER — Inpatient Hospital Stay: Payer: 59 | Attending: Oncology | Admitting: Oncology

## 2022-04-17 ENCOUNTER — Other Ambulatory Visit: Payer: Self-pay

## 2022-04-17 ENCOUNTER — Inpatient Hospital Stay: Payer: 59

## 2022-04-17 VITALS — BP 156/86 | HR 67 | Temp 97.7°F | Resp 20 | Wt 184.5 lb

## 2022-04-17 DIAGNOSIS — I8290 Acute embolism and thrombosis of unspecified vein: Secondary | ICD-10-CM

## 2022-04-17 DIAGNOSIS — Z803 Family history of malignant neoplasm of breast: Secondary | ICD-10-CM | POA: Diagnosis not present

## 2022-04-17 DIAGNOSIS — D735 Infarction of spleen: Secondary | ICD-10-CM | POA: Diagnosis not present

## 2022-04-17 DIAGNOSIS — F17210 Nicotine dependence, cigarettes, uncomplicated: Secondary | ICD-10-CM | POA: Insufficient documentation

## 2022-04-17 DIAGNOSIS — K5190 Ulcerative colitis, unspecified, without complications: Secondary | ICD-10-CM | POA: Insufficient documentation

## 2022-04-17 DIAGNOSIS — Z800 Family history of malignant neoplasm of digestive organs: Secondary | ICD-10-CM | POA: Insufficient documentation

## 2022-04-17 LAB — ANTITHROMBIN III: AntiThromb III Func: 115 % (ref 75–120)

## 2022-04-17 NOTE — Progress Notes (Signed)
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  This is a 61 year old woman with ulcerative colitis who presented to the emergency department in May 2023 with complaint of left upper quadrant pain.  Her evaluation included CT scan of the abdomen and pelvis which showed a wedge-shaped low-density involving the inferior aspect of her spleen concerning for infarct.  No other abnormalities noted on her imaging.  She underwent a CT angio of the chest abdomen and pelvis obtained on Mar 14, 2022 showed contraction of the splenic infarct compared to previous imaging study.  Signs of ulcerative colitis although no gross active inflammation noted.  6 mm pulmonary nodules but otherwise no other abnormalities in the vascular structure.  She was started on aspirin at this time. Work-up including negative ANA, rheumatoid factor and mild elevation in her D-dimer 2.11.  Echocardiogram obtained on April 03, 2022 showed no abnormalities.     Clinically she reports her ulcerative colitis has been under reasonable control with very little diarrhea when she is on Lialda.  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  She continues to have left upper quadrant pain which for a while was subsiding but has flared up recently.  She barely takes any pain medication other than Tylenol.  She does have ultrasound only takes half a tablet at night occasionally.  She denies any history of thrombosis or bleeding.  She does not have any family history of venous thromboembolism.   She does not report any headaches, blurry vision, syncope or seizures. Does not report any fevers, chills or sweats.  Does not report any cough, wheezing or hemoptysis.  Does not report any chest pain, palpitation, orthopnea or leg edema.  Does not report any nausea, vomiting or abdominal pain.  Does not report any  constipation or diarrhea.  Does not report any skeletal complaints.    Does not report frequency, urgency or hematuria.  Does not report any skin rashes or lesions. Does not report any heat or cold intolerance.  Does not report any lymphadenopathy or petechiae.  Does not report any anxiety or depression.  Remaining review of systems is negative.     Past Medical History:  Diagnosis Date   Allergy    Anemia    Anxiety    Cancer ([place])    skin (nose & face)   Depression    Fibromyalgia    Fibromyalgia 04/09/2021   GERD (gastroesophageal reflux disease)    Inflammatory bowel disease (ulcerative colitis) ([place])    Migraines    Osteopenia   :   Past Surgical History:  Procedure Laterality Date   CESAREAN SECTION     CHOLECYSTECTOMY  03/19/2012   Procedure: LAPAROSCOPIC CHOLECYSTECTOMY;  Surgeon: Gwenyth Ober, MD;  Location: [facility];  Service: General;  Laterality: N/A;   FOOT SURGERY  1980's   LAPAROSCOPY     with cystectomy   LEFT OOPHORECTOMY     tendon relief     TUBAL LIGATION    :   Current Outpatient Medications:    acetaminophen (TYLENOL) 500 MG tablet, Take 500 mg by mouth as needed., Disp: , Rfl:    cycloSPORINE (RESTASIS) 0.05 % ophthalmic emulsion, 1 drop 2 (two) times daily., Disp: , Rfl:    Doxylamine Succinate, Sleep, (SLEEP AID PO), Take 1 tablet by mouth at bedtime., Disp: ,  Rfl:    mesalamine (LIALDA) 1.2 g EC tablet, Take 1.2 g by mouth every other day., Disp: , Rfl:    OMEPRAZOLE PO, Take 20 mg by mouth daily., Disp: , Rfl:    traMADol (ULTRAM) 50 MG tablet, Take 1 tablet (50 mg total) by mouth every 6 (six) hours as needed., Disp: 15 tablet, Rfl: 0:   Allergies  Allergen Reactions   Prednisone     Mouth swelled up   Azithromycin Rash  :   Family History  Problem Relation Age of Onset   Breast cancer Mother 21   Hypertension Mother    Cancer Mother    Stroke Mother    Cancer Maternal Grandfather    Heart disease Maternal Grandfather    Heart disease  Paternal Grandmother    Colon cancer Paternal Grandfather    Cancer Paternal Grandfather        testicular   Cancer Father        associated with colon polyps   Heart disease Father    Hyperlipidemia Father    Breast cancer Maternal Aunt   :   Social History   Socioeconomic History   Marital status: Married    Spouse name: Not on file   Number of children: 1   Years of education: Not on file   Highest education level: Not on file  Occupational History   Not on file  Tobacco Use   Smoking status: Every Day    Packs/day: 0.80    Years: 15.00    Total pack years: 12.00    Types: Cigarettes, Cigars   Smokeless tobacco: Never  Vaping Use   Vaping Use: Never used  Substance and Sexual Activity   Alcohol use: Yes    Alcohol/week: 0.0 - 1.0 standard drinks of alcohol    Comment: socially   Drug use: No   Sexual activity: Yes    Partners: Male    Birth control/protection: Surgical    Comment: BTL  Other Topics Concern   Not on file  Social History Narrative   Not on file   Social Determinants of Health   Financial Resource Strain: Not on file  Food Insecurity: Not on file  Transportation Needs: Not on file  Physical Activity: Not on file  Stress: Not on file  Social Connections: Not on file  Intimate Partner Violence: Not on file  :  Pertinent items are noted in HPI.  Exam: ECOG General appearance: alert and cooperative appeared without distress. Head: atraumatic without any abnormalities. Eyes: conjunctivae/corneas clear. PERRL.  Sclera anicteric. Throat: lips, mucosa, and tongue normal; without oral thrush or ulcers. Resp: clear to auscultation bilaterally without rhonchi, wheezes or dullness to percussion. Cardio: regular rate and rhythm, S1, S2 normal, no murmur, click, rub or gallop GI: soft, non-tender; bowel sounds normal; no masses,  no organomegaly Skin: Skin color, texture, turgor normal. No rashes or lesions Lymph nodes: Cervical, supraclavicular,  and axillary nodes normal. Neurologic: Grossly normal without any motor, sensory or deep tendon reflexes. Musculoskeletal: No joint deformity or effusion.   ECHOCARDIOGRAM COMPLETE  Result Date: 04/03/2022    ECHOCARDIOGRAM REPORT   Patient Name:   Kathy Stephens Date of Exam: 04/03/2022 Medical Rec #:  [id number]      Height:       63.0 in Accession #:    [id number]     Weight:       184.4 lb Date of Birth:  [birth year]     BSA:  1.868 m Patient Age:    77 years       BP:           134/83 mmHg Patient Gender: F              HR:           64 bpm. Exam Location:  [facility] Procedure: 2D Echo, 3D Echo, Cardiac Doppler, Color Doppler and Strain Analysis Indications:     Splenic Infarct; Cardiomegaly I51.7  History:         Patient has prior history of Echocardiogram examinations, most                  recent 03/21/2021.  Sonographer:     Mikki Santee RDCS Referring Phys:  [id number] Ronnell Freshwater Diagnosing Phys: Vernell Leep MD IMPRESSIONS  1. Left ventricular ejection fraction, by estimation, is 60 to 65%. The left ventricle has normal function. The left ventricle has no regional wall motion abnormalities. There is mild left ventricular hypertrophy. Left ventricular diastolic parameters were normal.  2. Right ventricular systolic function is normal. The right ventricular size is normal.  3. The mitral valve is normal in structure. No evidence of mitral valve regurgitation. No evidence of mitral stenosis.  4. The aortic valve is normal in structure. Aortic valve regurgitation is not visualized. No aortic stenosis is present.  5. The inferior vena cava is normal in size with greater than 50% respiratory variability, suggesting right atrial pressure of 3 mmHg. FINDINGS  Left Ventricle: Left ventricular ejection fraction, by estimation, is 60 to 65%. The left ventricle has normal function. The left ventricle has no regional wall motion abnormalities. The left ventricular internal cavity size was  normal in size. There is  mild left ventricular hypertrophy. Left ventricular diastolic parameters were normal. Right Ventricle: The right ventricular size is normal. No increase in right ventricular wall thickness. Right ventricular systolic function is normal. Left Atrium: Left atrial size was normal in size. Right Atrium: Right atrial size was normal in size. Pericardium: There is no evidence of pericardial effusion. Mitral Valve: The mitral valve is normal in structure. No evidence of mitral valve regurgitation. No evidence of mitral valve stenosis. Tricuspid Valve: The tricuspid valve is normal in structure. Tricuspid valve regurgitation is not demonstrated. No evidence of tricuspid stenosis. Aortic Valve: The aortic valve is normal in structure. Aortic valve regurgitation is not visualized. No aortic stenosis is present. Pulmonic Valve: The pulmonic valve was normal in structure. Pulmonic valve regurgitation is not visualized. No evidence of pulmonic stenosis. Aorta: The aortic root is normal in size and structure. Venous: The inferior vena cava is normal in size with greater than 50% respiratory variability, suggesting right atrial pressure of 3 mmHg. IAS/Shunts: The interatrial septum was not assessed.  LEFT VENTRICLE PLAX 2D LVIDd:         4.00 cm   Diastology LVIDs:         2.50 cm   LV e' medial:    6.38 cm/s LV PW:         1.10 cm   LV E/e' medial:  14.4 LV IVS:        1.20 cm   LV e' lateral:   9.90 cm/s LVOT diam:     2.00 cm   LV E/e' lateral: 9.3 LV SV:         61 LV SV Index:   33        2D Longitudinal Strain LVOT Area:  3.14 cm  2D Strain GLS Avg:     -16.8 %                           3D Volume EF:                          3D EF:        61 %                          LV EDV:       96 ml                          LV ESV:       37 ml                          LV SV:        59 ml RIGHT VENTRICLE RV Basal diam:  2.90 cm RV S prime:     10.70 cm/s TAPSE (M-mode): 1.5 cm LEFT ATRIUM             Index         RIGHT ATRIUM           Index LA diam:        3.60 cm 1.93 cm/m   RA Area:     11.40 cm LA Vol (A2C):   52.0 ml 27.84 ml/m  RA Volume:   23.40 ml  12.53 ml/m LA Vol (A4C):   38.0 ml 20.34 ml/m LA Biplane Vol: 45.0 ml 24.09 ml/m  AORTIC VALVE LVOT Vmax:   92.05 cm/s LVOT Vmean:  57.900 cm/s LVOT VTI:    0.196 m  AORTA Ao Root diam: 2.70 cm Ao Asc diam:  3.40 cm MITRAL VALVE MV Area (PHT): 3.48 cm    SHUNTS MV Decel Time: 218 msec    Systemic VTI:  0.20 m MV E velocity: 91.70 cm/s  Systemic Diam: 2.00 cm MV A velocity: 69.20 cm/s MV E/A ratio:  1.33 Manish Patwardhan MD Electronically signed by Vernell Leep MD Signature Date/Time: 04/03/2022/2:46:14 PM    Final    DG Hip Unilat W OR W/O Pelvis 2-3 Views Left  Result Date: 03/20/2022 CLINICAL DATA:  Chronic left hip pain. EXAM: DG HIP (WITH OR WITHOUT PELVIS) 2-3V LEFT COMPARISON:  CT scan of Mar 14, 2022. FINDINGS: There is no evidence of hip fracture or dislocation. There is noted sclerosis involving the superior portion of the left femoral head consistent with avascular necrosis as noted on recent CT scan. No significant joint space narrowing is noted. IMPRESSION: Focal sclerosis is seen involving superior portion of left femoral head consistent with avascular necrosis as noted on recent CT scan. Electronically Signed   By: Marijo Conception M.D.   On: 03/20/2022 13:30    Assessment and Plan:   61 year old with:  1.  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  CT angiogram of the chest abdomen and pelvis did not show any vascular compromise.  Laboratory testing showed a normal CBC in the setting of ulcerative colitis.  The differential diagnosis of these findings were discussed at this time.  I do not see any evidence of malignancy given her extensive imaging studies in addition to up-to-date mammography and colonoscopy.  Hypercoagulability could be also  a consideration but does not have any family history or personal history of  thrombosis.  She could have acquired thrombophilia that could be a contributing factor.  Hypercoagulable panel as well as a myeloproliferative panel will be obtained today to rule out a blood disorder.  The most likely etiology would be inflammatory vascular changes related to her inflammatory colitis.  Despite her disease.  Under control it is clear that she has some inflammatory markers and her worsening diarrhea could be an indication of a flareup that could result and these findings.  From a management standpoint, we will await the results of her hypercoagulable panel but I recommend conservative management as she is doing right now.  I see no need for full dose anticoagulation at this time.  2.  Follow-up: Will be in 3 months for repeat evaluation follow-up on her clinical status.  We will communicate the results of her hypercoagulable panel once it is completed.  45  minutes were dedicated to this visit. The time was spent on reviewing laboratory data, discussing treatment options, discussing differential diagnosis and answering questions regarding future plan.    A copy of this consult has been forwarded to the requesting physician.

## 2022-04-18 LAB — CARDIOLIPIN ANTIBODIES, IGG, IGM, IGA
Anticardiolipin IgA: <9 APL U/mL (ref 0–11)
Anticardiolipin IgG: <9 GPL U/mL (ref 0–14)
Anticardiolipin IgM: <9 MPL U/mL (ref 0–12)

## 2022-04-18 LAB — LUPUS ANTICOAGULANT PANEL
DRVVT: 38.6 s (ref 0.0–47.0)
PTT Lupus Anticoagulant: 34.2 s (ref 0.0–43.5)

## 2022-04-18 LAB — PROTEIN S, TOTAL: Protein S Ag, Total: 125 % (ref 60–150)

## 2022-04-18 LAB — BETA-2-GLYCOPROTEIN I ABS, IGG/M/A
Beta-2 Glyco I IgG: <9 GPI IgG units (ref 0–20)
Beta-2-Glycoprotein I IgA: <9 GPI IgA units (ref 0–25)
Beta-2-Glycoprotein I IgM: <9 GPI IgM units (ref 0–32)

## 2022-04-18 LAB — PROTEIN S ACTIVITY: Protein S Activity: 76 % (ref 63–140)

## 2022-04-18 LAB — PROTEIN C ACTIVITY: Protein C Activity: 126 % (ref 73–180)

## 2022-04-19 LAB — HOMOCYSTEINE: Homocysteine: 21.9 umol/L — ABNORMAL HIGH (ref 0.0–14.5)

## 2022-04-20 LAB — PROTEIN C, TOTAL: Protein C, Total: 107 % (ref 60–150)

## 2022-04-22 LAB — PROTHROMBIN GENE MUTATION

## 2022-04-22 LAB — JAK2 (INCLUDING V617F AND EXON 12), MPL,& CALR-NEXT GEN SEQ

## 2022-04-22 LAB — FACTOR 5 LEIDEN

## 2022-04-23 ENCOUNTER — Telehealth: Payer: Self-pay | Admitting: *Deleted

## 2022-04-23 NOTE — Telephone Encounter (Signed)
Notified of message below

## 2022-04-23 NOTE — Telephone Encounter (Signed)
-----   Message from Wyatt Portela, MD sent at 04/23/2022  8:31 AM EDT ----- Please let her know all her labs are normal

## 2022-04-25 ENCOUNTER — Other Ambulatory Visit: Payer: Self-pay | Admitting: *Deleted

## 2022-04-25 DIAGNOSIS — M25569 Pain in unspecified knee: Secondary | ICD-10-CM

## 2022-04-28 ENCOUNTER — Ambulatory Visit
Admission: RE | Admit: 2022-04-28 | Discharge: 2022-04-28 | Disposition: A | Discharge status: Home / Self Care | Payer: 59 | Source: Ambulatory Visit | Attending: Nurse Practitioner | Admitting: Nurse Practitioner

## 2022-04-28 DIAGNOSIS — Z1231 Encounter for screening mammogram for malignant neoplasm of breast: Secondary | ICD-10-CM

## 2022-04-30 NOTE — Progress Notes (Unsigned)
Established patient visit   Patient: Kathy Stephens   DOB: 01-18-1961   61 y.o. Female  MRN: [id number] Visit Date: 05/01/2022   No chief complaint on file.  Subjective    HPI  Follow up visit.  -echocardiogram results  --Left Ventricle: Left ventricular ejection fraction, by estimation, is 60 to 65%. The left ventricle has normal function. The left ventricle has no regional wall motion abnormalities. The left ventricular internal cavity size was normal in size. There is mild left ventricular hypertrophy. Left ventricular diastolic parameters were normal. Right Ventricle: The right ventricular size is normal. No increase in right ventricular wall thickness. Right ventricular systolic function is normal. Left Atrium: Left atrial size was normal in size. Right Atrium: Right atrial size was normal in size. Pericardium: There is no evidence of pericardial effusion. Mitral Valve: The mitral valve is normal in structure. No evidence of mitral valve regurgitation. No evidence of mitral valve stenosis. Tricuspid Valve: The tricuspid valve is normal in structure. Tricuspid valve regurgitation is not demonstrated. No evidence of tricuspid stenosis. Aortic Valve: The aortic valve is normal in structure. Aortic valve regurgitation is not visualized. No aortic stenosis is present. Pulmonic Valve: The pulmonic valve was normal in structure. Pulmonic valve regurgitation is not visualized. No evidence of pulmonic stenosis. Aorta: The aortic root is normal in size and structure. Venous: The inferior vena cava is normal in size with greater than 50% respiratory variability, suggesting right atrial pressure of 3 mmHg. IAS/Shunts: The interatrial septum was not assessed. Hematology consult  -negative for hypercoagulability. Orthopedics consultation -AVN left hip     Medications: Outpatient Medications Prior to Visit  Medication Sig   acetaminophen (TYLENOL) 500 MG tablet Take 500 mg by mouth  as needed.   cycloSPORINE (RESTASIS) 0.05 % ophthalmic emulsion 1 drop 2 (two) times daily.   Doxylamine Succinate, Sleep, (SLEEP AID PO) Take 1 tablet by mouth at bedtime.   mesalamine (LIALDA) 1.2 g EC tablet Take 1.2 g by mouth every other day.   OMEPRAZOLE PO Take 20 mg by mouth daily.   traMADol (ULTRAM) 50 MG tablet Take 1 tablet (50 mg total) by mouth every 6 (six) hours as needed.   No facility-administered medications prior to visit.    Review of Systems  {Labs (Optional):23779}   Objective    LMP 11/03/2006  BP Readings from Last 3 Encounters:  04/17/22 (!) 156/86  04/02/22 134/83  03/19/22 134/79    Wt Readings from Last 3 Encounters:  04/17/22 184 lb 8 oz (83.7 kg)  04/04/22 184 lb (83.5 kg)  04/02/22 184 lb 6.4 oz (83.6 kg)    Physical Exam  ***  No results found for any visits on 05/01/22.  Assessment & Plan     Problem List Items Addressed This Visit   None    No follow-ups on file.         Ronnell Freshwater, NP  [facility] [phone] (phone) [phone] (fax)  [place]

## 2022-05-01 ENCOUNTER — Encounter: Payer: Self-pay | Admitting: Nurse Practitioner

## 2022-05-01 ENCOUNTER — Ambulatory Visit: Payer: 59 | Admitting: Nurse Practitioner

## 2022-05-01 VITALS — BP 119/72 | HR 63 | Temp 97.8°F | Ht 62.99 in | Wt 184.1 lb

## 2022-05-01 DIAGNOSIS — K5190 Ulcerative colitis, unspecified, without complications: Secondary | ICD-10-CM | POA: Insufficient documentation

## 2022-05-01 DIAGNOSIS — R109 Unspecified abdominal pain: Secondary | ICD-10-CM | POA: Diagnosis not present

## 2022-05-01 DIAGNOSIS — D735 Infarction of spleen: Secondary | ICD-10-CM

## 2022-05-01 DIAGNOSIS — M87052 Idiopathic aseptic necrosis of left femur: Secondary | ICD-10-CM

## 2022-05-01 DIAGNOSIS — K51918 Ulcerative colitis, unspecified with other complication: Secondary | ICD-10-CM

## 2022-05-04 ENCOUNTER — Encounter: Payer: Self-pay | Admitting: Nurse Practitioner

## 2022-05-05 NOTE — Telephone Encounter (Signed)
Can you print out copies of her CT abdomen and pelvis and D/g left hip x-ray and fax to Dr. Maureen Ralphs? I can't print from my laptop. I'm sorry.

## 2022-05-05 NOTE — Telephone Encounter (Signed)
Ok already faxed

## 2022-05-14 ENCOUNTER — Other Ambulatory Visit: Payer: Self-pay | Admitting: Vascular Surgery

## 2022-05-14 ENCOUNTER — Ambulatory Visit: Payer: 59 | Admitting: Vascular Surgery

## 2022-05-14 ENCOUNTER — Ambulatory Visit (HOSPITAL_COMMUNITY)
Admission: RE | Admit: 2022-05-14 | Discharge: 2022-05-14 | Disposition: A | Discharge status: Home / Self Care | Payer: 59 | Source: Ambulatory Visit | Attending: Vascular Surgery | Admitting: Vascular Surgery

## 2022-05-14 ENCOUNTER — Encounter: Payer: Self-pay | Admitting: Vascular Surgery

## 2022-05-14 VITALS — BP 175/87 | HR 61 | Temp 98.3°F | Resp 20 | Ht 62.0 in | Wt 184.0 lb

## 2022-05-14 DIAGNOSIS — D735 Infarction of spleen: Secondary | ICD-10-CM

## 2022-05-14 DIAGNOSIS — I739 Peripheral vascular disease, unspecified: Secondary | ICD-10-CM | POA: Diagnosis present

## 2022-05-14 NOTE — Progress Notes (Signed)
Patient ID: Kathy Stephens, female   DOB: [birth year], 61 y.o.   MRN: [id number]  Reason for Consult: New Patient (Initial Visit)   Referred by Ronnell Freshwater, NP  Subjective:     HPI:  Kathy Stephens is a 61 y.o. female with history of ulcerative colitis.  She is a chronic every day smoker but without any history of coronary artery or vascular disease.  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  She has been evaluated by hematology as well as cardiology and now presents for vascular evaluation.  She states that her left upper quadrant and flank pain is improving.  She continues to smoke daily.  She does not take any antiplatelet agents at the direction of her cardiologist.  Past Medical History:  Diagnosis Date   Allergy    Anemia    Anxiety    Cancer ([place])    skin (nose & face)   Depression    Fibromyalgia    Fibromyalgia 04/09/2021   GERD (gastroesophageal reflux disease)    Inflammatory bowel disease (ulcerative colitis) ([place])    Migraines    Osteopenia    Family History  Problem Relation Age of Onset   Breast cancer Mother 38   Hypertension Mother    Cancer Mother    Stroke Mother    Cancer Maternal Grandfather    Heart disease Maternal Grandfather    Heart disease Paternal Grandmother    Colon cancer Paternal Grandfather    Cancer Paternal Grandfather        testicular   Cancer Father        associated with colon polyps   Heart disease Father    Hyperlipidemia Father    Breast cancer Maternal Aunt    Past Surgical History:  Procedure Laterality Date   CESAREAN SECTION     CHOLECYSTECTOMY  03/19/2012   Procedure: LAPAROSCOPIC CHOLECYSTECTOMY;  Surgeon: Gwenyth Ober, MD;  Location: [place];  Service: General;  Laterality: N/A;   FOOT SURGERY  1980's   LAPAROSCOPY     with cystectomy   LEFT OOPHORECTOMY     tendon relief     TUBAL LIGATION      Short Social History:  Social  History   Tobacco Use   Smoking status: Every Day    Types: Cigars   Smokeless tobacco: Never   Tobacco comments:    5 daily  Substance Use Topics   Alcohol use: Yes    Alcohol/week: 0.0 - 1.0 standard drinks of alcohol    Comment: socially    Allergies  Allergen Reactions   Azithromycin Rash    Current Outpatient Medications  Medication Sig Dispense Refill   acetaminophen (TYLENOL) 500 MG tablet Take 500 mg by mouth as needed.     cycloSPORINE (RESTASIS) 0.05 % ophthalmic emulsion 1 drop 2 (two) times daily.     Doxylamine Succinate, Sleep, (SLEEP AID PO) Take 1 tablet by mouth at bedtime.     mesalamine (LIALDA) 1.2 g EC tablet Take 1.2 g by mouth every other day.     OMEPRAZOLE PO Take 20 mg by mouth daily.     traMADol (ULTRAM) 50 MG tablet Take 1 tablet (50 mg total) by mouth every 6 (six) hours as needed. 15 tablet 0   No current facility-administered medications for this visit.    Review of Systems  Constitutional:  Constitutional negative. HENT: HENT negative.  Eyes: Eyes negative.  Respiratory: Respiratory negative.  Cardiovascular: Cardiovascular negative.  GI: Positive for abdominal pain.  Musculoskeletal: Musculoskeletal negative.  Skin: Skin negative.  Neurological: Neurological negative. Hematologic: Hematologic/lymphatic negative.  Psychiatric: Psychiatric negative.        Objective:  Objective   Vitals:   05/14/22 1151  BP: (!) 175/87  Pulse: 61  Resp: 20  Temp: 98.3 F (36.8 C)  SpO2: 97%  Weight: 184 lb (83.5 kg)  Height: '5\' 2"'$  (1.575 m)   Body mass index is 33.65 kg/m.  Physical Exam HENT:     Head: Normocephalic.     Nose: Nose normal.     Mouth/Throat:     Mouth: Mucous membranes are moist.  Eyes:     Pupils: Pupils are equal, round, and reactive to light.  Cardiovascular:     Rate and Rhythm: Normal rate.     Pulses: Normal pulses.  Pulmonary:     Effort: Pulmonary effort is normal.  Abdominal:     General: Abdomen is  flat.     Palpations: Abdomen is soft.  Musculoskeletal:        General: Normal range of motion.     Cervical back: Normal range of motion and neck supple.     Right lower leg: No edema.     Left lower leg: No edema.  Skin:    General: Skin is warm and dry.     Capillary Refill: Capillary refill takes less than 2 seconds.  Neurological:     General: No focal deficit present.     Mental Status: She is alert.  Psychiatric:        Mood and Affect: Mood normal.        Thought Content: Thought content normal.     Data: ABI Findings:  +---------+------------------+-----+---------+--------+  Right    Rt Pressure (mmHg)IndexWaveform Comment   +---------+------------------+-----+---------+--------+  Brachial 165                                       +---------+------------------+-----+---------+--------+  PTA      188               1.09 triphasic          +---------+------------------+-----+---------+--------+  DP       169               0.98 biphasic dampened  +---------+------------------+-----+---------+--------+  Great Toe136               0.79 Normal             +---------+------------------+-----+---------+--------+   +---------+------------------+-----+-----------+-------+  Left     Lt Pressure (mmHg)IndexWaveform   Comment  +---------+------------------+-----+-----------+-------+  Brachial 172                                        +---------+------------------+-----+-----------+-------+  PTA      166               0.97 multiphasic         +---------+------------------+-----+-----------+-------+  DP       177               1.03 multiphasic         +---------+------------------+-----+-----------+-------+  [place]  0.52 Abnormal            +---------+------------------+-----+-----------+-------+   +-------+-----------+-----------+------------+------------+  ABI/TBIToday's ABIToday's  TBIPrevious ABIPrevious TBI  +-------+-----------+-----------+------------+------------+  Right  1.09       .79                                  +-------+-----------+-----------+------------+------------+  Left   1.03       .52                                  +-------+-----------+-----------+------------+------------+             Summary:  Right: Resting right ankle-brachial index is within normal range. No  evidence of significant right lower extremity arterial disease. The right  toe-brachial index is normal.   Left: Resting left ankle-brachial index is within normal range. No  evidence of significant left lower extremity arterial disease. The left  toe-brachial index is abnormal.   CT IMPRESSION: 1. Limited assessment of arterial structures due to bolus timing on the current study. No gross acute process related to vasculature which was noted to be patent on yesterday's venous phase evaluation 2. Vessels are patent previously and there is no new evidence of solid organ infarct. 3. Signs of splenic infarct with contraction of the dominant area of infarct in the inferior spleen compared to the prior imaging study from Mar 13, 2022. No new areas of splenic infarction. Noted and persistent raising the question 4. Antral thickening of gastritis or ulcer disease. 5. Signs of ulcerative colitis, no gross active inflammation though mild low level inflammation is possible. 6. 6 mm pulmonary nodule better seen on abdominal CT imaging. Non-contrast chest CT at 6-12 months is recommended. If the nodule is stable at time of repeat CT, then future CT at 18-24 months (from today's scan) is considered optional for low-risk patients, but is recommended for high-risk patients. This recommendation follows the consensus statement: Guidelines for Management of Incidental Pulmonary Nodules Detected on CT Images: From the Fleischner Society 2017; Radiology 2017; 284:228-243. 7.  Bilateral adrenal nodules largest on the RIGHT 2.8 x 1.5 cm, enlarged since previous imaging more likely hyperplasia on the LEFT and adenoma on the RIGHT. Homogeneous well-circumscribed appearance. Consider follow-up noncontrast imaging of the abdomen only at this will likely confirm low-attenuation and adrenal adenoma. 8. Signs of femoral AVN. 9. Aortic atherosclerosis.     Assessment/Plan:    61 year old female with smoking history and recent splenic infarction.  We reviewed her CT scan together does not have any evidence of embolic source.  I recommended smoking cessation and baby aspirin daily and she can see me on an as-needed basis.     Waynetta Sandy MD [facility]

## 2022-06-26 DIAGNOSIS — M1612 Unilateral primary osteoarthritis, left hip: Secondary | ICD-10-CM | POA: Insufficient documentation

## 2022-07-09 NOTE — Progress Notes (Signed)
Established patient visit   Patient: Kathy Stephens   DOB: [birth year]   61 y.o. Female  MRN: [id number] Visit Date: 07/10/2022   Chief Complaint  Patient presents with   Follow-up   Subjective    HPI  Follow up visit  -splenic infarct - has seen vein and vascular who said there were no issues and she did not have to return to see them at all.  -avascular necrosis of left hip  does have a small area of avascular necrosis. She als had a labral tear in the left hip. Expects to heal on it's own. She will repeat MRI with orthopedics in 6 months.  --now seeing orthopedics and vein and vascular.  -history of ulcerative colitis. Did advise she see her GI provider she would like to see another provider. She is having diarrhea every day.    Medications: Outpatient Medications Prior to Visit  Medication Sig   acetaminophen (TYLENOL) 500 MG tablet Take 500 mg by mouth as needed.   cycloSPORINE (RESTASIS) 0.05 % ophthalmic emulsion 1 drop 2 (two) times daily.   Doxylamine Succinate, Sleep, (SLEEP AID PO) Take 1 tablet by mouth at bedtime.   mesalamine (LIALDA) 1.2 g EC tablet Take 1.2 g by mouth daily.   OMEPRAZOLE PO Take 20 mg by mouth daily.   [DISCONTINUED] traMADol (ULTRAM) 50 MG tablet Take 1 tablet (50 mg total) by mouth every 6 (six) hours as needed.   No facility-administered medications prior to visit.    Review of Systems  Constitutional:  Negative for activity change, appetite change, chills, fatigue and fever.  HENT:  Negative for congestion, postnasal drip, rhinorrhea, sinus pressure, sinus pain, sneezing and sore throat.   Eyes: Negative.   Respiratory:  Negative for cough, chest tightness, shortness of breath and wheezing.   Cardiovascular:  Negative for chest pain and palpitations.  Gastrointestinal:  Positive for abdominal pain and diarrhea. Negative for constipation, nausea and vomiting.       Has to take anti diarrheal medications everyday and still has multiple loose  stools everyday. Long history of ulcerative colitis   Endocrine: Negative for cold intolerance, heat intolerance, polydipsia and polyuria.  Genitourinary:  Negative for dyspareunia, dysuria, flank pain, frequency and urgency.  Musculoskeletal:  Negative for arthralgias, back pain and myalgias.  Skin:  Negative for rash.  Allergic/Immunologic: Negative for environmental allergies.  Neurological:  Negative for dizziness, weakness and headaches.  Hematological:  Negative for adenopathy.  Psychiatric/Behavioral:  The patient is not nervous/anxious.        Objective     Today's Vitals   07/10/22 0913 07/10/22 0939  BP: (Abnormal) 147/81 (Abnormal) 145/81  Pulse: 62   SpO2: 98%   Weight: 185 lb 1.9 oz (84 kg)   Height: '5\' 2"'$  (1.575 m)    Body mass index is 33.86 kg/m.   BP Readings from Last 3 Encounters:  07/24/22 (Abnormal) 155/84  07/10/22 (Abnormal) 145/81  05/14/22 (Abnormal) 175/87    Wt Readings from Last 3 Encounters:  07/24/22 186 lb 9.6 oz (84.6 kg)  07/10/22 185 lb 1.9 oz (84 kg)  05/14/22 184 lb (83.5 kg)    Physical Exam Vitals and nursing note reviewed.  Constitutional:      Appearance: Normal appearance. She is well-developed.  HENT:     Head: Normocephalic.     Nose: Nose normal.     Mouth/Throat:     Mouth: Mucous membranes are moist.     Pharynx: Oropharynx is clear.  Eyes:  Extraocular Movements: Extraocular movements intact.     Conjunctiva/sclera: Conjunctivae normal.     Pupils: Pupils are equal, round, and reactive to light.  Cardiovascular:     Rate and Rhythm: Normal rate and regular rhythm.     Pulses: Normal pulses.     Heart sounds: Normal heart sounds.  Pulmonary:     Effort: Pulmonary effort is normal.     Breath sounds: Normal breath sounds.  Abdominal:     General: Bowel sounds are normal. There is no distension.     Palpations: Abdomen is soft. There is no mass.     Tenderness: There is no abdominal tenderness. There is no  right CVA tenderness, left CVA tenderness, guarding or rebound.     Hernia: No hernia is present.  Musculoskeletal:        General: Normal range of motion.     Cervical back: Normal range of motion and neck supple.  Lymphadenopathy:     Cervical: No cervical adenopathy.  Skin:    General: Skin is warm and dry.     Capillary Refill: Capillary refill takes less than 2 seconds.  Neurological:     General: No focal deficit present.     Mental Status: She is alert and oriented to person, place, and time.  Psychiatric:        Mood and Affect: Mood normal.        Behavior: Behavior normal.        Thought Content: Thought content normal.        Judgment: Judgment normal.      Assessment & Plan    1. Other ulcerative colitis without complication [facility] Reviewed labs and imaging related to splenic infarct..  Concern for sequelae of ulcerative colitis.  Refer to new GI provider for further evaluation and treatment. - Ambulatory referral to Gastroenterology  2. Avascular necrosis of left femur [facility]) Patient being monitored through orthopedics.  3. Elevated blood pressure reading in office with white coat syndrome, without diagnosis of hypertension Blood pressure elevated.  Encouraged her to limit sodium and increase water in her diet.  Advised her to monitor blood pressure at home approximately the same time every day.  Goal is to have blood pressure 130/80 or better.  We will treat as indicated.   Problem List Items Addressed This Visit       Digestive   Ulcerative colitis [facility]) - Primary   Relevant Orders   Ambulatory referral to Gastroenterology     Musculoskeletal and Integument   Avascular necrosis of left femur ([place])     Other   Elevated blood pressure reading in office with white coat syndrome, without diagnosis of hypertension     Return in about 6 months (around 01/08/2023) for blood pressure.         Ronnell Freshwater, NP  [facility] [phone] (phone) [phone] (fax)  [place]

## 2022-07-10 ENCOUNTER — Encounter: Payer: Self-pay | Admitting: Nurse Practitioner

## 2022-07-10 ENCOUNTER — Ambulatory Visit: Payer: 59 | Admitting: Nurse Practitioner

## 2022-07-10 VITALS — BP 145/81 | HR 62 | Ht 62.0 in | Wt 185.1 lb

## 2022-07-10 DIAGNOSIS — M87052 Idiopathic aseptic necrosis of left femur: Secondary | ICD-10-CM

## 2022-07-10 DIAGNOSIS — R030 Elevated blood-pressure reading, without diagnosis of hypertension: Secondary | ICD-10-CM | POA: Diagnosis not present

## 2022-07-10 DIAGNOSIS — K5180 Other ulcerative colitis without complications: Secondary | ICD-10-CM

## 2022-07-24 ENCOUNTER — Inpatient Hospital Stay: Payer: 59 | Attending: Oncology | Admitting: Oncology

## 2022-07-24 VITALS — BP 155/84 | HR 64 | Temp 97.7°F | Resp 18 | Wt 186.6 lb

## 2022-07-24 DIAGNOSIS — D735 Infarction of spleen: Secondary | ICD-10-CM | POA: Diagnosis present

## 2022-07-24 DIAGNOSIS — I8290 Acute embolism and thrombosis of unspecified vein: Secondary | ICD-10-CM | POA: Diagnosis not present

## 2022-07-24 NOTE — Progress Notes (Signed)
Hematology and Oncology Follow Up Visit  Kathy Stephens [id number] Mar 10, 1961 61 y.o. 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  Etiology remains idiopathic without any evidence of a hematological disorder or vascular abnormalities.  Ulcerative colitis could be contributing factor.     Current therapy: Active surveillance.  Interim History: [name] returns today for a follow-up.  Since last visit, she reports feeling well without any major complaints.  She has reported very intermittent and occasional left upper quadrant pain if she takes Tylenol.  He denies fevers or chills or sweats.  She denies any hospitalizations.  She denies any bleeding or recent thrombosis episodes.  She has reported occasional diarrhea related to colitis exacerbation and currently taking Lialda daily.     Medications: I have reviewed the patient's current medications.  Current Outpatient Medications  Medication Sig Dispense Refill   acetaminophen (TYLENOL) 500 MG tablet Take 500 mg by mouth as needed.     cycloSPORINE (RESTASIS) 0.05 % ophthalmic emulsion 1 drop 2 (two) times daily.     Doxylamine Succinate, Sleep, (SLEEP AID PO) Take 1 tablet by mouth at bedtime.     mesalamine (LIALDA) 1.2 g EC tablet Take 1.2 g by mouth every other day.     OMEPRAZOLE PO Take 20 mg by mouth daily.     No current facility-administered medications for this visit.     Allergies:  Allergies  Allergen Reactions   Azithromycin Rash     Physical Exam:  ECOG:     General appearance: Comfortable appearing without any discomfort Head: Normocephalic without any trauma Oropharynx: Mucous membranes are moist and pink without any thrush or ulcers. Eyes: Pupils are equal and round reactive to light. Lymph nodes: No cervical, supraclavicular, inguinal or axillary lymphadenopathy.   Heart:regular rate and rhythm.  S1  and S2 without leg edema. Lung: Clear without any rhonchi or wheezes.  No dullness to percussion. Abdomin: Soft, nontender, nondistended with good bowel sounds.  No hepatosplenomegaly. Musculoskeletal: No joint deformity or effusion.  Full range of motion noted. Neurological: No deficits noted on motor, sensory and deep tendon reflex exam. Skin: No petechial rash or dryness.  Appeared moist.      Lab Results: Lab Results  Component Value Date   WBC 7.1 03/19/2022   HGB 14.9 03/19/2022   HCT 44.1 03/19/2022   MCV 88 03/19/2022   PLT 219 03/19/2022     Chemistry      Component Value Date/Time   NA 140 03/19/2022 1040   K 4.6 03/19/2022 1040   CL 102 03/19/2022 1040   CO2 24 03/19/2022 1040   BUN 8 03/19/2022 1040   CREATININE 0.99 03/19/2022 1040   CREATININE 0.81 02/03/2013 1224      Component Value Date/Time   CALCIUM 9.2 03/19/2022 1040   ALKPHOS 126 (H) 03/19/2022 1040   AST 11 03/19/2022 1040   ALT 7 03/19/2022 1040   BILITOT 0.2 03/19/2022 1040          Impression and Plan:  60 year old with:   1.  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     The natural course of this disease and treatment options were discussed.  Hypercoagulable panel as well as myeloproliferative disorder panel will reviewed and showed no abnormalities at this time.  The etiology of her infarct is  likely idiopathic in nature versus related to inflammatory conditions such as ulcerative colitis.  At this point, I do not see any evidence of a hematological disorder and I do not recommend any further work-up.  I also advised her about smoking cessation as well as continuing low-dose aspirin.  2.  Age-appropriate cancer screening: This was discussed in detail including breast cancer screening given her family history in her mother.  We have talked about genetic counseling as well as periodic mammography that she  is already completing.  She opted against genetic counseling at this time and she will continue annual mammography.   3.  Follow-up: I am happy to see her in the future as needed.   20  minutes were spent on this encounter.  The time was dedicated to updating her disease status, treatment choices and outlining future plan of care discussion.      Zola Button, MD 9/21/20233:11 PM

## 2022-07-27 DIAGNOSIS — R030 Elevated blood-pressure reading, without diagnosis of hypertension: Secondary | ICD-10-CM | POA: Insufficient documentation

## 2022-11-25 ENCOUNTER — Ambulatory Visit: Payer: 59 | Admitting: Gastroenterology

## 2022-11-28 ENCOUNTER — Other Ambulatory Visit: Payer: Self-pay

## 2022-12-04 ENCOUNTER — Other Ambulatory Visit: Payer: Self-pay

## 2022-12-04 ENCOUNTER — Encounter: Payer: Self-pay | Admitting: Gastroenterology

## 2022-12-04 ENCOUNTER — Ambulatory Visit: Payer: 59 | Admitting: Gastroenterology

## 2022-12-04 VITALS — BP 175/103 | HR 66 | Temp 97.8°F | Ht 62.0 in | Wt 184.4 lb

## 2022-12-04 DIAGNOSIS — K51519 Left sided colitis with unspecified complications: Secondary | ICD-10-CM

## 2022-12-04 DIAGNOSIS — K5130 Ulcerative (chronic) rectosigmoiditis without complications: Secondary | ICD-10-CM

## 2022-12-04 MED ORDER — SUTAB 1479-225-188 MG PO TABS: 12.0000 | ORAL_TABLET | Freq: Once | ORAL | 0 refills | Status: AC

## 2022-12-04 MED ORDER — NA SULFATE-K SULFATE-MG SULF 17.5-3.13-1.6 GM/177ML PO SOLN: 354.0000 mL | Freq: Once | ORAL | 0 refills | Status: AC

## 2022-12-04 NOTE — Progress Notes (Signed)
Cephas Darby, MD [address]  [place], [place] [address]  Main: [phone]  Fax: [phone]    Gastroenterology Consultation  Referring Provider:     Ronnell Freshwater, NP Primary Care Physician:  Ronnell Freshwater, NP Primary Gastroenterologist:  Dr. Cephas Darby Reason for Consultation: Left-sided colitis        HPI:   Kathy Stephens is a 62 y.o. female referred by Ronnell Freshwater, NP  for consultation & management of left-sided colitis.  Patient is diagnosed with mild chronic active left-sided colitis based on her screening colonoscopy by Dr. Carol Ada in 10/2017.  She was found to have mild erythema noted in the recto- sigmoid colon.  Normal-appearing mucosa was found in rest of the colon and biopsies were performed.  Pathology revealed active colitis in background suggestive of chronic injury.  She reports that she was diagnosed with C. difficile prior to her colonoscopy and she was treated by Dr. Benson Norway.  Apparently, since her diagnosis, she was on Lialda for 4 years.  Lialda 2.4 g daily caused her headaches, therefore was taking 1.2 g daily which provided only modest improvement in her symptoms.  Patient takes Imodium as needed for diarrhea.  She reports having 3-4 watery bowel movements in the morning followed by which she takes Imodium and rest of the day with no bowel movement.  Denies any nocturnal diarrhea or rectal bleeding.  She does report mild left lower quadrant discomfort.  Denies any abdominal bloating.  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  She underwent a laboratory workup by oncology and vascular surgery and it was unremarkable.  Etiology was attributed to likely from underlying ulcerative colitis.  Patient does not recall if she had a flareup during this event.  However, she has been off Lialda since May 2023.  She had to short flareups in December and in January that lasted  for 2 days.  To smoke 5 cigars daily Does not drink alcohol Retired Denies any known family history of GI malignancy, inflammatory bowel disease  NSAIDs: None  Antiplts/Anticoagulants/Anti thrombotics: None  GI Procedures:  Upper endoscopy and colonoscopy 10/08/2017   Past Medical History:  Diagnosis Date   Allergy    Anemia    Anxiety    Cancer ([place])    skin (nose & face)   Depression    Fibromyalgia    Fibromyalgia 04/09/2021   GERD (gastroesophageal reflux disease)    Inflammatory bowel disease (ulcerative colitis) ([place])    Migraines    Osteopenia     Past Surgical History:  Procedure Laterality Date   CESAREAN SECTION     CHOLECYSTECTOMY  03/19/2012   Procedure: LAPAROSCOPIC CHOLECYSTECTOMY;  Surgeon: Gwenyth Ober, MD;  Location: [facility];  Service: General;  Laterality: N/A;   FOOT SURGERY  1980's   LAPAROSCOPY     with cystectomy   LEFT OOPHORECTOMY     tendon relief     TUBAL LIGATION       Current Outpatient Medications:    acetaminophen (TYLENOL) 500 MG tablet, Take 500 mg by mouth as needed., Disp: , Rfl:    Cholecalciferol (VITAMIN D) 50 MCG (2000 UT) CAPS, Take by mouth., Disp: , Rfl:    cycloSPORINE (RESTASIS) 0.05 % ophthalmic emulsion, 1 drop 2 (two) times daily., Disp: , Rfl:    Doxylamine Succinate, Sleep, (SLEEP AID PO), Take 1 tablet by mouth at  bedtime., Disp: , Rfl:    famotidine (PEPCID) 20 MG tablet, Take 20 mg by mouth 2 (two) times daily., Disp: , Rfl:    loperamide (IMODIUM A-D) 2 MG capsule, Take by mouth as needed for diarrhea or loose stools., Disp: , Rfl:    Magnesium 300 MG CAPS, Take by mouth., Disp: , Rfl:    Na Sulfate-K Sulfate-Mg Sulf 17.5-3.13-1.6 GM/177ML SOLN, Take 354 mLs by mouth once for 1 dose., Disp: 354 mL, Rfl: 0   Family History  Problem Relation Age of Onset   Breast cancer Mother 16   Hypertension Mother    Cancer Mother    Stroke Mother    Cancer Maternal Grandfather    Heart disease Maternal Grandfather     Heart disease Paternal Grandmother    Colon cancer Paternal Grandfather    Cancer Paternal Grandfather        testicular   Cancer Father        associated with colon polyps   Heart disease Father    Hyperlipidemia Father    Breast cancer Maternal Aunt      Social History   Tobacco Use   Smoking status: Every Day    Types: Cigars   Smokeless tobacco: Never   Tobacco comments:    5 daily  Vaping Use   Vaping Use: Never used  Substance Use Topics   Alcohol use: Yes    Alcohol/week: 0.0 - 1.0 standard drinks of alcohol    Comment: socially   Drug use: No    Allergies as of 12/04/2022 - Review Complete 12/04/2022  Allergen Reaction Noted   Azithromycin Rash 03/13/2021    Review of Systems:    All systems reviewed and negative except where noted in HPI.   Physical Exam:  BP (!) 175/103 (BP Location: Left Arm, Patient Position: Sitting, Cuff Size: Normal)   Pulse 66   Temp 97.8 F (36.6 C) (Oral)   Ht '5\' 2"'$  (1.575 m)   Wt 184 lb 6 oz (83.6 kg)   LMP 11/03/2006   BMI 33.72 kg/m  Patient's last menstrual period was 11/03/2006.  General:   Alert,  Well-developed, well-nourished, pleasant and cooperative in NAD Head:  Normocephalic and atraumatic. Eyes:  Sclera clear, no icterus.   Conjunctiva pink. Ears:  Normal auditory acuity. Nose:  No deformity, discharge, or lesions. Mouth:  No deformity or lesions,oropharynx pink & moist. Neck:  Supple; no masses or thyromegaly. Lungs:  Respirations even and unlabored.  Clear throughout to auscultation.   No wheezes, crackles, or rhonchi. No acute distress. Heart:  Regular rate and rhythm; no murmurs, clicks, rubs, or gallops. Abdomen:  Normal bowel sounds. Soft, mild left lower quadrant tenderness and non-distended without masses, hepatosplenomegaly or hernias noted.  No guarding or rebound tenderness.   Rectal: Not performed Msk:  Symmetrical without gross deformities. Good, equal movement & strength bilaterally. Pulses:   Normal pulses noted. Extremities:  No clubbing or edema.  No cyanosis. Neurologic:  Alert and oriented x3;  grossly normal neurologically. Skin:  Intact without significant lesions or rashes. No jaundice. Psych:  Alert and cooperative. Normal mood and affect.  Imaging Studies: Reviewed  Assessment and Plan:   KHRISTA BRAUN is a 62 y.o. Caucasian female with history of chronic GERD, mild chronic rectosigmoid colitis, previously maintained on Lialda, splenic infarct of unclear etiology after a laboratory workup is seen here to establish care for ulcerative colitis  Ulcerative colitis, mild in severity based on the index colonoscopy Recommend  repeat colonoscopy to assess disease severity and determine next steps in the treatment of colitis Okay to take Imodium as needed  I have discussed alternative options, risks & benefits,  which include, but are not limited to, bleeding, infection, perforation,respiratory complication & drug reaction.  The patient agrees with this plan & written consent will be obtained.     Follow up based on the colonoscopy results   Cephas Darby, MD

## 2022-12-09 ENCOUNTER — Encounter: Payer: Self-pay | Admitting: Gastroenterology

## 2022-12-09 ENCOUNTER — Other Ambulatory Visit: Payer: Self-pay

## 2022-12-16 ENCOUNTER — Other Ambulatory Visit: Payer: Self-pay

## 2022-12-16 ENCOUNTER — Ambulatory Visit
Admission: RE | Admit: 2022-12-16 | Discharge: 2022-12-16 | Disposition: A | Discharge status: Home / Self Care | Payer: 59 | Attending: Gastroenterology | Admitting: Gastroenterology

## 2022-12-16 ENCOUNTER — Encounter: Payer: Self-pay | Admitting: Gastroenterology

## 2022-12-16 ENCOUNTER — Ambulatory Visit: Payer: 59 | Admitting: Anesthesiology

## 2022-12-16 ENCOUNTER — Encounter
Admission: RE | Disposition: A | Discharge status: Home / Self Care | Payer: Self-pay | Source: Home / Self Care | Attending: Gastroenterology

## 2022-12-16 DIAGNOSIS — F17290 Nicotine dependence, other tobacco product, uncomplicated: Secondary | ICD-10-CM | POA: Diagnosis not present

## 2022-12-16 DIAGNOSIS — D649 Anemia, unspecified: Secondary | ICD-10-CM | POA: Insufficient documentation

## 2022-12-16 DIAGNOSIS — Z09 Encounter for follow-up examination after completed treatment for conditions other than malignant neoplasm: Secondary | ICD-10-CM | POA: Diagnosis present

## 2022-12-16 DIAGNOSIS — Z8711 Personal history of peptic ulcer disease: Secondary | ICD-10-CM | POA: Insufficient documentation

## 2022-12-16 DIAGNOSIS — K219 Gastro-esophageal reflux disease without esophagitis: Secondary | ICD-10-CM | POA: Insufficient documentation

## 2022-12-16 DIAGNOSIS — K5150 Left sided colitis without complications: Secondary | ICD-10-CM | POA: Diagnosis not present

## 2022-12-16 DIAGNOSIS — K51519 Left sided colitis with unspecified complications: Secondary | ICD-10-CM

## 2022-12-16 DIAGNOSIS — Z8719 Personal history of other diseases of the digestive system: Secondary | ICD-10-CM | POA: Diagnosis not present

## 2022-12-16 DIAGNOSIS — D759 Disease of blood and blood-forming organs, unspecified: Secondary | ICD-10-CM | POA: Insufficient documentation

## 2022-12-16 HISTORY — PX: COLONOSCOPY WITH PROPOFOL: SHX5780

## 2022-12-16 SURGERY — COLONOSCOPY WITH PROPOFOL
Anesthesia: General | Site: Rectum

## 2022-12-16 MED ORDER — STERILE WATER FOR IRRIGATION IR SOLN
Status: DC | PRN
  Administered 2022-12-16: 50 mL

## 2022-12-16 MED ORDER — SODIUM CHLORIDE 0.9 % IV SOLN: INTRAVENOUS | Status: DC

## 2022-12-16 MED ORDER — PROPOFOL 10 MG/ML IV BOLUS
INTRAVENOUS | Status: DC | PRN
  Administered 2022-12-16: 160 ug/kg/min via INTRAVENOUS
  Administered 2022-12-16: 50 mg via INTRAVENOUS
  Administered 2022-12-16: 50 ug/kg/min via INTRAVENOUS

## 2022-12-16 MED ORDER — LACTATED RINGERS IV SOLN: INTRAVENOUS | Status: DC

## 2022-12-16 MED ORDER — ONDANSETRON HCL 4 MG/2ML IJ SOLN
INTRAMUSCULAR | Status: DC | PRN
  Administered 2022-12-16: 4 mg via INTRAVENOUS

## 2022-12-16 MED ORDER — LIDOCAINE HCL (CARDIAC) PF 100 MG/5ML IV SOSY
PREFILLED_SYRINGE | INTRAVENOUS | Status: DC | PRN
  Administered 2022-12-16: 40 mg via INTRAVENOUS

## 2022-12-16 SURGICAL SUPPLY — 7 items
FORCEPS BIOP RAD 4 LRG CAP 4 (CUTTING FORCEPS) IMPLANT
GOWN CVR UNV OPN BCK APRN NK (MISCELLANEOUS) ×2 IMPLANT
GOWN ISOL THUMB LOOP REG UNIV (MISCELLANEOUS) ×2
KIT PRC NS LF DISP ENDO (KITS) ×1 IMPLANT
KIT PROCEDURE OLYMPUS (KITS) ×1
MANIFOLD NEPTUNE II (INSTRUMENTS) ×1 IMPLANT
WATER STERILE IRR 250ML POUR (IV SOLUTION) ×1 IMPLANT

## 2022-12-16 NOTE — H&P (Signed)
Cephas Darby, MD [address]  [place]  [place], [place] [address]  Main: [phone]  Fax: [phone] Pager: [phone]  Primary Care Physician:  Ronnell Freshwater, NP Primary Gastroenterologist:  Dr. Cephas Darby  Pre-Procedure History & Physical: HPI:  Kathy Stephens is a 62 y.o. female is here for an colonoscopy.   Past Medical History:  Diagnosis Date   Allergy    Anemia    Anxiety    Cancer ([place])    skin (nose & face)   Depression    Fibromyalgia 04/09/2021   GERD (gastroesophageal reflux disease)    Inflammatory bowel disease (ulcerative colitis) ([place])    Migraines    while on Liada (mesalazine), not on it at this time   Osteopenia     Past Surgical History:  Procedure Laterality Date   CESAREAN SECTION     CHOLECYSTECTOMY  03/19/2012   Procedure: LAPAROSCOPIC CHOLECYSTECTOMY;  Surgeon: Gwenyth Ober, MD;  Location: [facility];  Service: General;  Laterality: N/A;   FOOT SURGERY  1980's   LAPAROSCOPY     with cystectomy   LEFT OOPHORECTOMY     tendon relief     TUBAL LIGATION      Prior to Admission medications   Medication Sig Start Date End Date Taking? Authorizing Provider  Cholecalciferol (VITAMIN D3) 50 MCG (2000 UT) CHEW Chew by mouth.   Yes [provider]  diphenhydrAMINE (BENADRYL) 25 mg capsule Take 25 mg by mouth every 6 (six) hours as needed for sleep.   Yes [provider]  famotidine (PEPCID) 20 MG tablet Take 20 mg by mouth 2 (two) times daily.   Yes [provider]  Magnesium 300 MG CAPS Take by mouth.   Yes [provider]  acetaminophen (TYLENOL) 500 MG tablet Take 500 mg by mouth as needed. Patient not taking: Reported on 12/09/2022    [provider]  cycloSPORINE (RESTASIS) 0.05 % ophthalmic emulsion 1 drop 2 (two) times daily.    [provider]  loperamide (IMODIUM A-D) 2 MG capsule Take by mouth as needed for diarrhea or loose stools.    [provider]     Allergies as of 12/04/2022 - Review Complete 12/04/2022  Allergen Reaction Noted   Azithromycin Rash 03/13/2021    Family History  Problem Relation Age of Onset   Breast cancer Mother 73   Hypertension Mother    Cancer Mother    Stroke Mother    Cancer Maternal Grandfather    Heart disease Maternal Grandfather    Heart disease Paternal Grandmother    Colon cancer Paternal Grandfather    Cancer Paternal Grandfather        testicular   Cancer Father        associated with colon polyps   Heart disease Father    Hyperlipidemia Father    Breast cancer Maternal Aunt     Social History   Socioeconomic History   Marital status: Married    Spouse name: Not on file   Number of children: 1   Years of education: Not on file   Highest education level: Not on file  Occupational History   Not on file  Tobacco Use   Smoking status: Every Day    Types: Cigars   Smokeless tobacco: Never   Tobacco comments:    5 daily  Vaping Use   Vaping Use: Never used  Substance and Sexual Activity   Alcohol use: Yes  Alcohol/week: 0.0 - 1.0 standard drinks of alcohol    Comment: socially   Drug use: No   Sexual activity: Yes    Partners: Male    Birth control/protection: Surgical    Comment: BTL  Other Topics Concern   Not on file  Social History Narrative   Not on file   Social Determinants of Health   Financial Resource Strain: Not on file  Food Insecurity: Not on file  Transportation Needs: Not on file  Physical Activity: Not on file  Stress: Not on file  Social Connections: Not on file  Intimate Partner Violence: Not on file    Review of Systems: See HPI, otherwise negative ROS  Physical Exam: BP (!) 150/99   Pulse 70   Temp (!) 96.1 F (35.6 C)   Ht 5' 2"$  (1.575 m)   Wt 80.3 kg   LMP 11/03/2006   SpO2 97%   BMI 32.37 kg/m  General:   Alert,  pleasant and cooperative in NAD Head:  Normocephalic and atraumatic. Neck:  Supple; no masses or  thyromegaly. Lungs:  Clear throughout to auscultation.    Heart:  Regular rate and rhythm. Abdomen:  Soft, nontender and nondistended. Normal bowel sounds, without guarding, and without rebound.   Neurologic:  Alert and  oriented x4;  grossly normal neurologically.  Impression/Plan: Kathy Stephens is here for an colonoscopy to be performed for history of left sided UC  Risks, benefits, limitations, and alternatives regarding  colonoscopy have been reviewed with the patient.  Questions have been answered.  All parties agreeable.   Sherri Sear, MD  12/16/2022, 7:39 AM

## 2022-12-16 NOTE — Op Note (Signed)
[facility] Gastroenterology Patient Name: Kathy Stephens Procedure Date: 12/16/2022 8:12 AM MRN: [id number] Account #: [id number] Date of Birth: 25-Sep-1961 Admit Type: Outpatient Age: 62 Room: [facility] OR ROOM 01 Gender: Female Note Status: Finalized Instrument Name: [id number] Procedure:             Colonoscopy Indications:           Follow-up of left-sided chronic ulcerative colitis,                         Disease activity assessment of left-sided chronic                         ulcerative colitis Providers:             Lin Landsman MD, MD Medicines:             General Anesthesia Complications:         No immediate complications. Estimated blood loss: None. Procedure:             Pre-Anesthesia Assessment:                        - Prior to the procedure, a History and Physical was                         performed, and patient medications and allergies were                         reviewed. The patient is competent. The risks and                         benefits of the procedure and the sedation options and                         risks were discussed with the patient. All questions                         were answered and informed consent was obtained.                         Patient identification and proposed procedure were                         verified by the physician, the nurse, the                         anesthesiologist, the anesthetist and the technician                         in the pre-procedure area in the procedure room in the                         endoscopy suite. Mental Status Examination: alert and                         oriented. Airway Examination: normal oropharyngeal                         airway and neck mobility. Respiratory Examination:  clear to auscultation. CV Examination: normal.                         Prophylactic Antibiotics: The patient does not require                         prophylactic  antibiotics. Prior Anticoagulants: The                         patient has taken no anticoagulant or antiplatelet                         agents. ASA Grade Assessment: II - A patient with mild                         systemic disease. After reviewing the risks and                         benefits, the patient was deemed in satisfactory                         condition to undergo the procedure. The anesthesia                         plan was to use general anesthesia. Immediately prior                         to administration of medications, the patient was                         re-assessed for adequacy to receive sedatives. The                         heart rate, respiratory rate, oxygen saturations,                         blood pressure, adequacy of pulmonary ventilation, and                         response to care were monitored throughout the                         procedure. The physical status of the patient was                         re-assessed after the procedure.                        After obtaining informed consent, the colonoscope was                         passed under direct vision. Throughout the procedure,                         the patient's blood pressure, pulse, and oxygen                         saturations were monitored continuously. The was  introduced through the anus and advanced to the 10 cm                         into the ileum. The colonoscopy was performed without                         difficulty. The patient tolerated the procedure well.                         The quality of the bowel preparation was evaluated                         using the BBPS [facility] Bowel Preparation Scale) with                         scores of: Right Colon = 3, Transverse Colon = 3 and                         Left Colon = 3 (entire mucosa seen well with no                         residual staining, small fragments of stool or opaque                          liquid). The total BBPS score equals 9. The terminal                         ileum, ileocecal valve, appendiceal orifice, and                         rectum were photographed. Findings:      The perianal and digital rectal examinations were normal. Pertinent       negatives include normal sphincter tone and no palpable rectal lesions.      The terminal ileum appeared normal.      Normal mucosa was found in the left colon and in the right colon.       Biopsies were taken with a cold forceps for histology. Estimated blood       loss: none.      The retroflexed view of the distal rectum and anal verge was normal and       showed no anal or rectal abnormalities. Impression:            - The examined portion of the ileum was normal.                        - Normal mucosa in the left colon and in the right                         colon. Biopsied.                        - The distal rectum and anal verge are normal on                         retroflexion view. Recommendation:        - Discharge patient to home (with escort).                        -  Resume previous diet today.                        - Continue present medications.                        - Await pathology results. Procedure Code(s):     --- Professional ---                        [phone], Colonoscopy, flexible; with biopsy, single or                         multiple Diagnosis Code(s):     --- Professional ---                        K51.50, Left sided colitis without complications CPT copyright 2022 American Medical Association. All rights reserved. The codes documented in this report are preliminary and upon coder review may  be revised to meet current compliance requirements. Dr. Ulyess Mort Lin Landsman MD, MD 12/16/2022 8:37:45 AM This report has been signed electronically. Number of Addenda: 0 Note Initiated On: 12/16/2022 8:12 AM Scope Withdrawal Time: 0 hours 9 minutes 22 seconds  Total Procedure  Duration: 0 hours 12 minutes 48 seconds  Estimated Blood Loss:  Estimated blood loss: none.      [facility]

## 2022-12-16 NOTE — Anesthesia Postprocedure Evaluation (Signed)
Anesthesia Post Note  Patient: Kathy Stephens  Procedure(s) Performed: COLONOSCOPY WITH BIOPSY (Rectum)  Patient location during evaluation: PACU Anesthesia Type: General Level of consciousness: awake and alert Pain management: pain level controlled Vital Signs Assessment: post-procedure vital signs reviewed and stable Respiratory status: spontaneous breathing, nonlabored ventilation, respiratory function stable and patient connected to nasal cannula oxygen Cardiovascular status: blood pressure returned to baseline and stable Postop Assessment: no apparent nausea or vomiting Anesthetic complications: no   No notable events documented.   Last Vitals:  Vitals:   12/16/22 0840 12/16/22 0850  BP: 132/75 (!) 143/80  Pulse: 62 63  Resp: 14 18  Temp: (!) 36.3 C (!) 36.3 C  SpO2: 100% 99%    Last Pain:  Vitals:   12/16/22 0850  PainSc: 0-No pain                 Ilene Qua

## 2022-12-16 NOTE — Transfer of Care (Signed)
Immediate Anesthesia Transfer of Care Note  Patient: Kathy Stephens  Procedure(s) Performed: COLONOSCOPY WITH BIOPSY (Rectum)  Patient Location: PACU  Anesthesia Type: General  Level of Consciousness: awake, alert  and patient cooperative  Airway and Oxygen Therapy: Patient Spontanous Breathing and Patient connected to supplemental oxygen  Post-op Assessment: Post-op Vital signs reviewed, Patient's Cardiovascular Status Stable, Respiratory Function Stable, Patent Airway and No signs of Nausea or vomiting  Post-op Vital Signs: Reviewed and stable  Complications: No notable events documented.

## 2022-12-16 NOTE — Anesthesia Preprocedure Evaluation (Addendum)
Anesthesia Evaluation  Patient identified by MRN, date of birth, ID band Patient awake    Reviewed: Allergy & Precautions, NPO status , Patient's Chart, lab work & pertinent test results  History of Anesthesia Complications Negative for: history of anesthetic complications  Airway Mallampati: III  TM Distance: >3 FB Neck ROM: full    Dental no notable dental hx.    Pulmonary Current Smoker   Pulmonary exam normal        Cardiovascular negative cardio ROS Normal cardiovascular exam     Neuro/Psych  Headaches PSYCHIATRIC DISORDERS Anxiety Depression       GI/Hepatic Neg liver ROS, PUD,GERD  Medicated,,  Endo/Other  negative endocrine ROS    Renal/GU negative Renal ROS  negative genitourinary   Musculoskeletal  (+) Arthritis ,  Fibromyalgia -  Abdominal   Peds  Hematology  (+) Blood dyscrasia, anemia   Anesthesia Other Findings Past Medical History: No date: Allergy No date: Anemia No date: Anxiety No date: Cancer [facility])     Comment:  skin (nose & face) No date: Depression 04/09/2021: Fibromyalgia No date: GERD (gastroesophageal reflux disease) No date: Inflammatory bowel disease (ulcerative colitis) ([place]) No date: Migraines     Comment:  while on Liada (mesalazine), not on it at this time No date: Osteopenia  Past Surgical History: No date: CESAREAN SECTION 03/19/2012: CHOLECYSTECTOMY     Comment:  Procedure: LAPAROSCOPIC CHOLECYSTECTOMY;  Surgeon: Gwenyth Ober, MD;  Location: [facility];  Service: General;                Laterality: N/A; 1980's: FOOT SURGERY No date: LAPAROSCOPY     Comment:  with cystectomy No date: LEFT OOPHORECTOMY No date: tendon relief No date: TUBAL LIGATION  BMI    Body Mass Index: 32.37 kg/m      Reproductive/Obstetrics negative OB ROS                             Anesthesia Physical Anesthesia Plan  ASA: 2  Anesthesia Plan: General    Post-op Pain Management:    Induction: Intravenous  PONV Risk Score and Plan: Propofol infusion and TIVA  Airway Management Planned: Natural Airway and Nasal Cannula  Additional Equipment:   Intra-op Plan:   Post-operative Plan:   Informed Consent: I have reviewed the patients History and Physical, chart, labs and discussed the procedure including the risks, benefits and alternatives for the proposed anesthesia with the patient or authorized representative who has indicated his/her understanding and acceptance.     Dental Advisory Given  Plan Discussed with: Anesthesiologist, CRNA and Surgeon  Anesthesia Plan Comments: (Patient consented for risks of anesthesia including but not limited to:  - adverse reactions to medications - risk of airway placement if required - damage to eyes, teeth, lips or other oral mucosa - nerve damage due to positioning  - sore throat or hoarseness - Damage to heart, brain, nerves, lungs, other parts of body or loss of life  Patient voiced understanding.)        Anesthesia Quick Evaluation

## 2022-12-17 ENCOUNTER — Encounter: Payer: Self-pay | Admitting: Gastroenterology

## 2022-12-18 LAB — SURGICAL PATHOLOGY

## 2023-01-08 ENCOUNTER — Ambulatory Visit: Payer: 59 | Admitting: Nurse Practitioner

## 2023-01-20 ENCOUNTER — Encounter: Payer: Self-pay | Admitting: Nurse Practitioner

## 2023-01-20 ENCOUNTER — Ambulatory Visit: Payer: 59 | Admitting: Nurse Practitioner

## 2023-01-20 VITALS — BP 150/82 | HR 60 | Ht 62.0 in | Wt 185.1 lb

## 2023-01-20 DIAGNOSIS — K51918 Ulcerative colitis, unspecified with other complication: Secondary | ICD-10-CM

## 2023-01-20 DIAGNOSIS — R030 Elevated blood-pressure reading, without diagnosis of hypertension: Secondary | ICD-10-CM | POA: Diagnosis not present

## 2023-01-20 DIAGNOSIS — M87052 Idiopathic aseptic necrosis of left femur: Secondary | ICD-10-CM | POA: Diagnosis not present

## 2023-01-20 NOTE — Progress Notes (Signed)
Established patient visit   Patient: Kathy Stephens   DOB: [birth year]   61 y.o. Female  MRN: [id number] Visit Date: 01/20/2023   Chief Complaint  Patient presents with   Medical Management of Chronic Issues   Subjective    HPI  Follow up -ulcerative colitis  --no longer on lialda  --recently had colonoscopy which looked good.  -blood pressure slightly elevated today  -patient declines taking blood pressure medication at this time.  -She denies chest pain, chest pressure, or shortness of breath. She denies headaches or visual disturbances. She denies abdominal pain, nausea, vomiting, or changes in bowel or bladder habits.     Medications: Outpatient Medications Prior to Visit  Medication Sig   Cholecalciferol (VITAMIN D3) 50 MCG (2000 UT) CHEW Chew by mouth.   cycloSPORINE (RESTASIS) 0.05 % ophthalmic emulsion 1 drop 2 (two) times daily.   diphenhydrAMINE (BENADRYL) 25 mg capsule Take 25 mg by mouth every 6 (six) hours as needed for sleep.   famotidine (PEPCID) 20 MG tablet Take 20 mg by mouth 2 (two) times daily.   loperamide (IMODIUM A-D) 2 MG capsule Take by mouth as needed for diarrhea or loose stools.   Magnesium 300 MG CAPS Take by mouth.   No facility-administered medications prior to visit.    Review of Systems See HPI     Last CBC Lab Results  Component Value Date   WBC 7.1 03/19/2022   HGB 14.9 03/19/2022   HCT 44.1 03/19/2022   MCV 88 03/19/2022   MCH 29.9 03/19/2022   RDW 12.1 03/19/2022   PLT 219 03/19/2022   Last metabolic panel Lab Results  Component Value Date   GLUCOSE 79 03/19/2022   NA 140 03/19/2022   K 4.6 03/19/2022   CL 102 03/19/2022   CO2 24 03/19/2022   BUN 8 03/19/2022   CREATININE 0.99 03/19/2022   EGFR 66 03/19/2022   CALCIUM 9.2 03/19/2022   PROT 7.0 03/19/2022   ALBUMIN 4.2 03/19/2022   LABGLOB 2.8 03/19/2022   AGRATIO 1.5 03/19/2022   BILITOT 0.2 03/19/2022   ALKPHOS 126 (H) 03/19/2022   AST 11 03/19/2022   ALT 7  03/19/2022   ANIONGAP 11 03/14/2022   Last lipids Lab Results  Component Value Date   CHOL 259 (H) 03/29/2021   HDL 56 03/29/2021   LDLCALC 169 (H) 03/29/2021   TRIG 185 (H) 03/29/2021   CHOLHDL 4.6 (H) 03/29/2021    Last thyroid functions Lab Results  Component Value Date   TSH 2.110 04/09/2021   Last vitamin D Lab Results  Component Value Date   VD25OH 17.8 (L) 11/27/2017       Objective     Today's Vitals   01/20/23 1318 01/20/23 1353  BP: (Abnormal) 154/82 (Abnormal) 150/82  Pulse: (Abnormal) 59 60  SpO2: 98%   Weight: 185 lb 1.9 oz (84 kg)   Height: 5\' 2"  (1.575 m)    Body mass index is 33.86 kg/m.  BP Readings from Last 3 Encounters:  01/20/23 (Abnormal) 150/82  12/16/22 (Abnormal) 143/80  12/04/22 (Abnormal) 175/103    Wt Readings from Last 3 Encounters:  01/20/23 185 lb 1.9 oz (84 kg)  12/16/22 177 lb (80.3 kg)  12/04/22 184 lb 6 oz (83.6 kg)    Physical Exam Vitals and nursing note reviewed.  Constitutional:      Appearance: Normal appearance. She is well-developed.  HENT:     Head: Normocephalic and atraumatic.     Nose: Nose normal.  Mouth/Throat:     Mouth: Mucous membranes are moist.     Pharynx: Oropharynx is clear.  Eyes:     Extraocular Movements: Extraocular movements intact.     Conjunctiva/sclera: Conjunctivae normal.     Pupils: Pupils are equal, round, and reactive to light.  Neck:     Vascular: No carotid bruit.  Cardiovascular:     Rate and Rhythm: Normal rate and regular rhythm.     Pulses: Normal pulses.     Heart sounds: Normal heart sounds.  Pulmonary:     Effort: Pulmonary effort is normal.     Breath sounds: Normal breath sounds.  Abdominal:     Palpations: Abdomen is soft.  Musculoskeletal:        General: Normal range of motion.     Cervical back: Normal range of motion and neck supple.  Lymphadenopathy:     Cervical: No cervical adenopathy.  Skin:    General: Skin is warm and dry.     Capillary Refill:  Capillary refill takes less than 2 seconds.  Neurological:     General: No focal deficit present.     Mental Status: She is alert and oriented to person, place, and time.  Psychiatric:        Mood and Affect: Mood normal.        Behavior: Behavior normal.        Thought Content: Thought content normal.        Judgment: Judgment normal.      Assessment & Plan     Elevated blood pressure reading in office with white coat syndrome, without diagnosis of hypertension Assessment & Plan: Advised a DASH diet with increased water. Encouraged increased exercise. Monitor routinely. She should contact the office for blood pressure that is consistently at or over 140/90.    Ulcerative colitis with other complication, unspecified location [facility]) Assessment & Plan: Reviewed colonoscopy results done 12/2022 which were essentially normal. No longer on Lialda. Will follow up with GI for further evaluation.    Avascular necrosis of left femur [facility]) Assessment & Plan: Will continue to see orthopedics as needed.         Return in about 4 months (around 05/22/2023) for health maintenance exam, FBW a week prior to visit.         Carlean Jews, NP  [facility] at [facility] [phone] (phone) [phone] (fax)  [facility]

## 2023-02-28 NOTE — Assessment & Plan Note (Signed)
Advised a DASH diet with increased water. Encouraged increased exercise. Monitor routinely. She should contact the office for blood pressure that is consistently at or over 140/90.

## 2023-02-28 NOTE — Assessment & Plan Note (Signed)
Reviewed colonoscopy results done 12/2022 which were essentially normal. No longer on Lialda. Will follow up with GI for further evaluation.

## 2023-02-28 NOTE — Assessment & Plan Note (Signed)
Will continue to see orthopedics as needed.

## 2023-03-10 IMAGING — MG MM DIGITAL SCREENING BILAT W/ TOMO AND CAD
8 series · 8 of 24 positions shown · non-contrast
Comparison: Previous exam(s).

ACR Breast Density Category a: The breast tissue is almost entirely
fatty.

CLINICAL DATA: Screening.

EXAM:
DIGITAL SCREENING BILATERAL MAMMOGRAM WITH TOMOSYNTHESIS AND CAD
TECHNIQUE: Bilateral screening digital craniocaudal and mediolateral oblique
mammograms were obtained. Bilateral screening digital breast
tomosynthesis was performed. The images were evaluated with
computer-aided detection.

[R CC synth-2D]
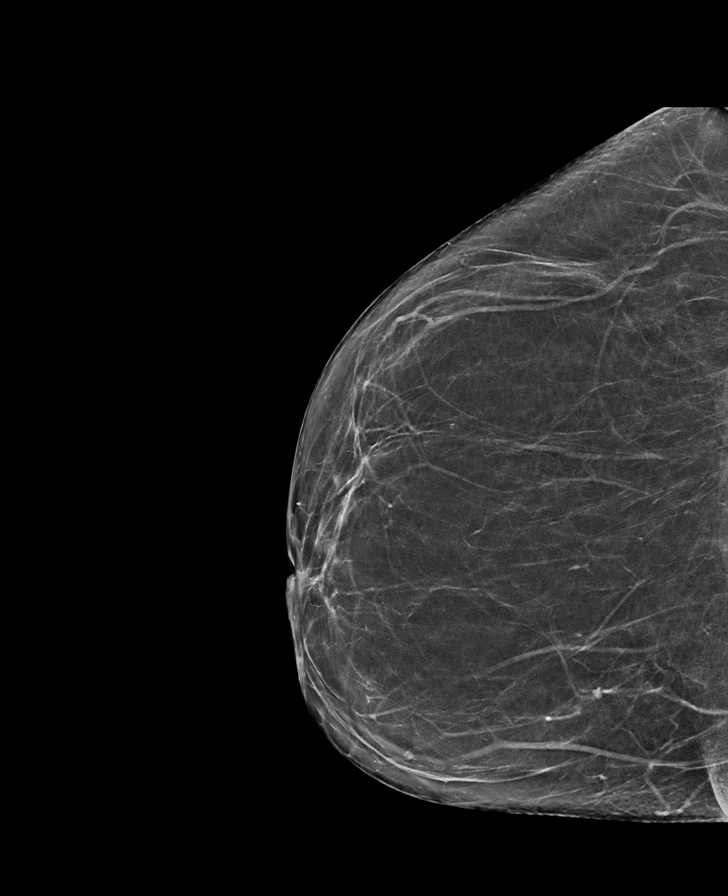

[R MLO synth-2D]
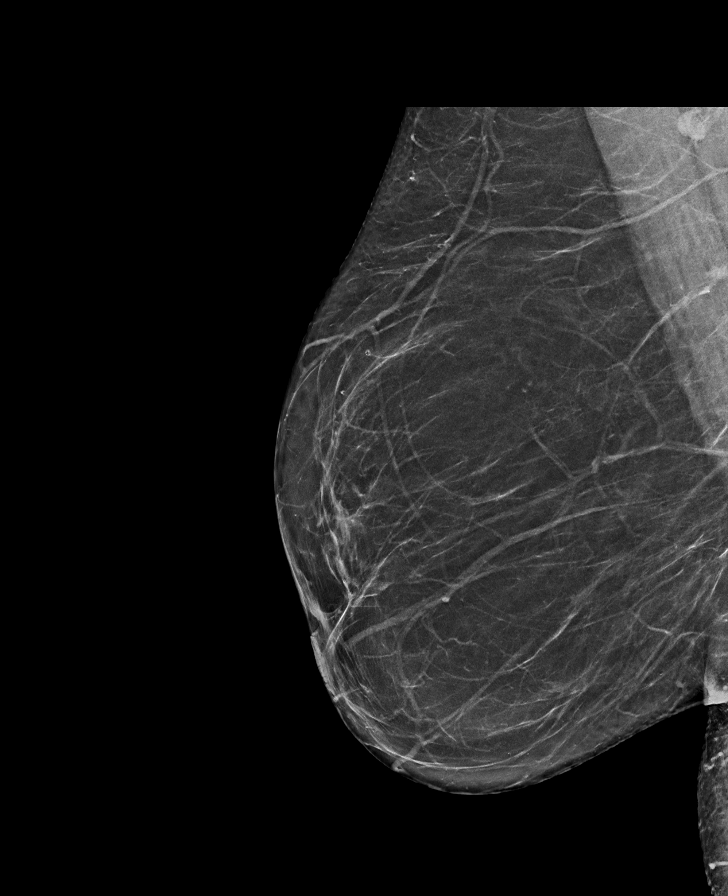

[L MLO synth-2D]
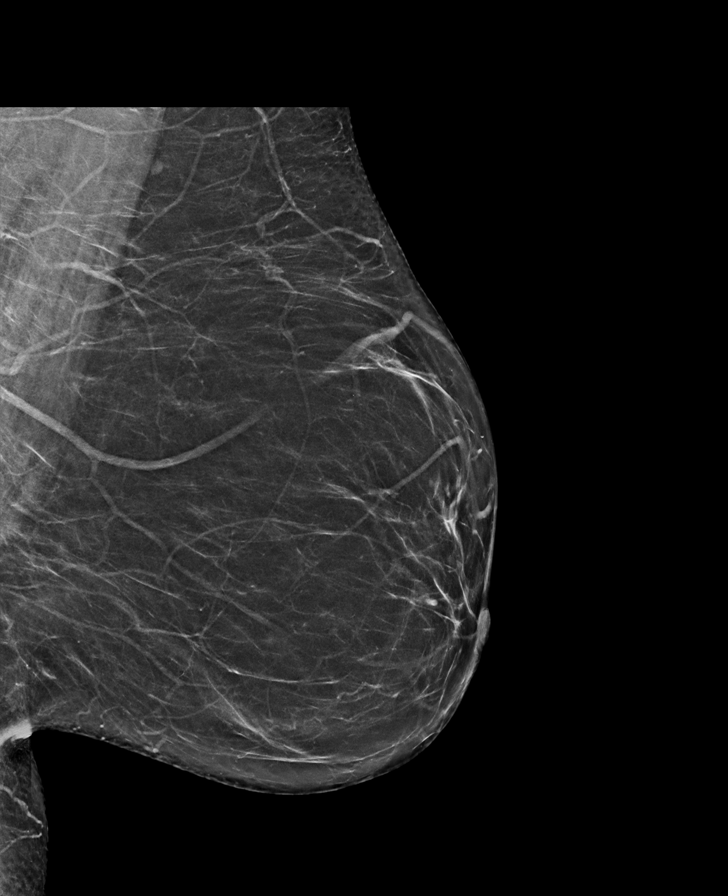

[L CC synth-2D]
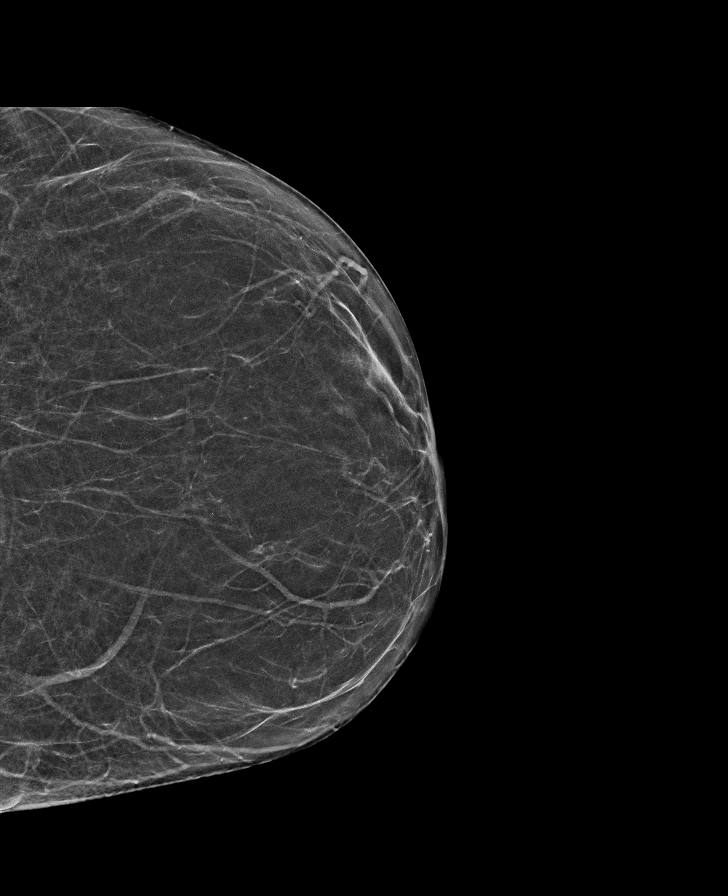

[R CC tomo · tomo slice 33/66.0]
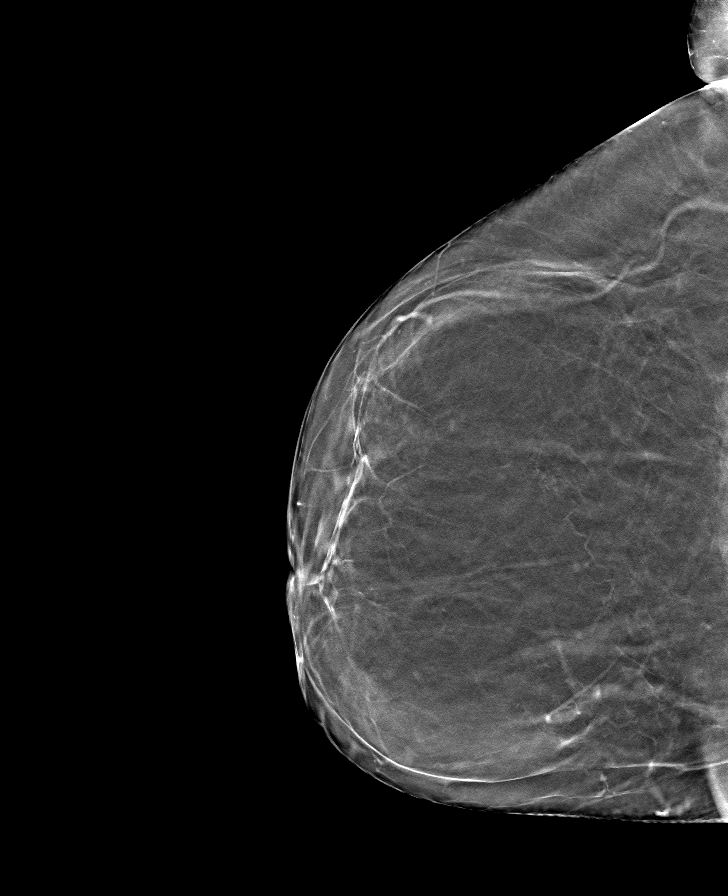

[L CC tomo · tomo slice 29/58.0]
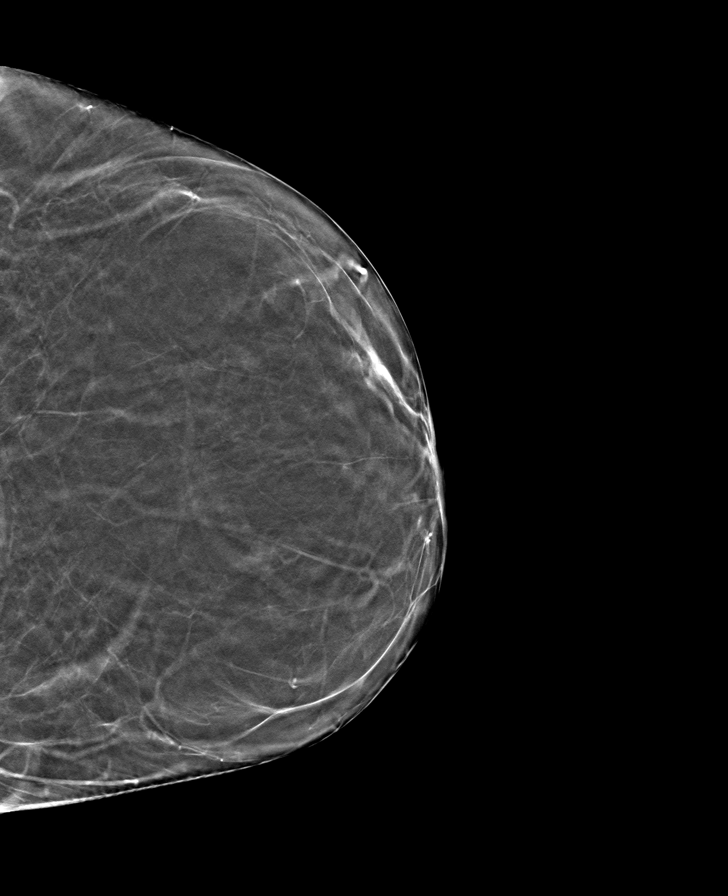

[L MLO tomo · tomo slice 35/68.0]
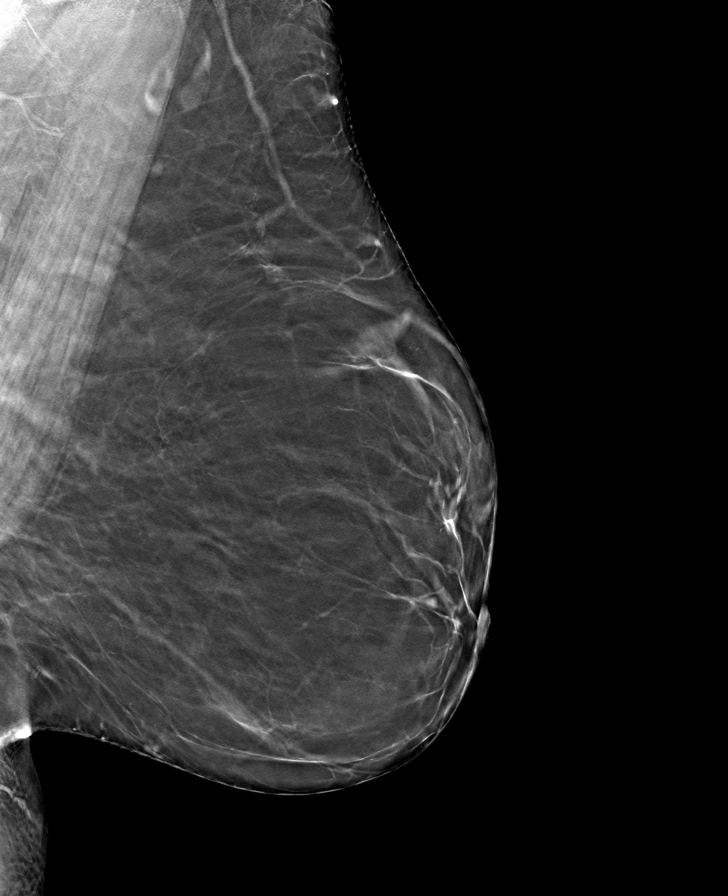

[R MLO tomo · tomo slice 35/68.0]
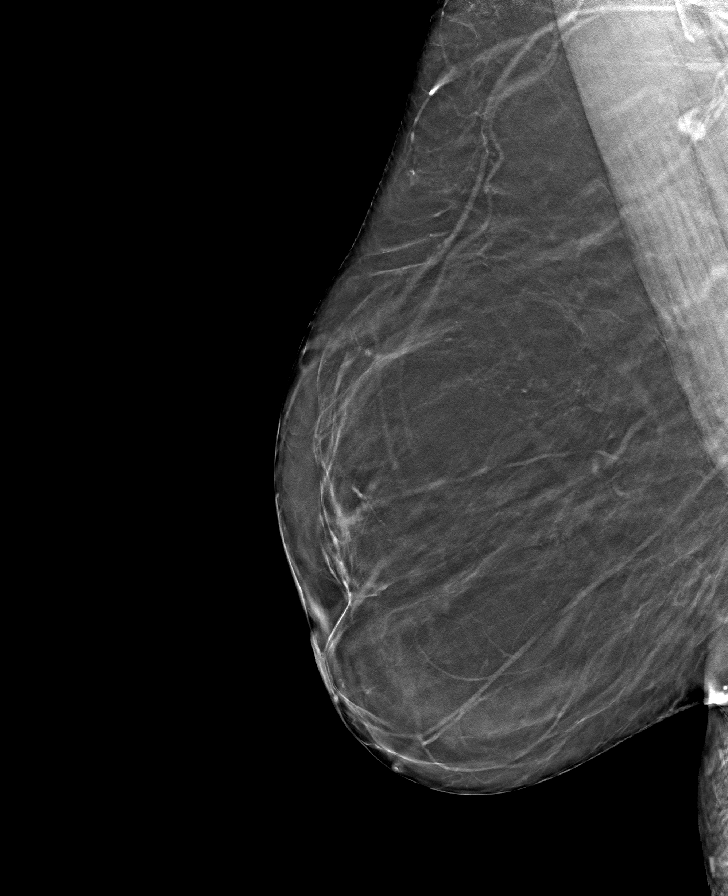

[8 of 24 positions shown; findings below may reference images not displayed]

FINDINGS: There are no findings suspicious for malignancy. The images were
evaluated with computer-aided detection.
IMPRESSION: No mammographic evidence of malignancy. A result letter of this
screening mammogram will be mailed directly to the patient.

RECOMMENDATION:
Screening mammogram in one year. (Code:JP-J-DD5)

BI-RADS CATEGORY  1: Negative.

## 2023-05-12 ENCOUNTER — Emergency Department (HOSPITAL_COMMUNITY): Payer: 59

## 2023-05-12 ENCOUNTER — Emergency Department (HOSPITAL_COMMUNITY)
Admission: EM | Admit: 2023-05-12 | Discharge: 2023-05-12 | Disposition: A | Discharge status: Home / Self Care | Payer: 59 | Attending: Emergency Medicine | Admitting: Emergency Medicine

## 2023-05-12 ENCOUNTER — Encounter (HOSPITAL_COMMUNITY): Payer: Self-pay

## 2023-05-12 DIAGNOSIS — R251 Tremor, unspecified: Secondary | ICD-10-CM | POA: Insufficient documentation

## 2023-05-12 DIAGNOSIS — I6509 Occlusion and stenosis of unspecified vertebral artery: Secondary | ICD-10-CM

## 2023-05-12 DIAGNOSIS — R030 Elevated blood-pressure reading, without diagnosis of hypertension: Secondary | ICD-10-CM | POA: Insufficient documentation

## 2023-05-12 DIAGNOSIS — R202 Paresthesia of skin: Secondary | ICD-10-CM | POA: Diagnosis not present

## 2023-05-12 DIAGNOSIS — R42 Dizziness and giddiness: Secondary | ICD-10-CM | POA: Diagnosis present

## 2023-05-12 DIAGNOSIS — R200 Anesthesia of skin: Secondary | ICD-10-CM | POA: Diagnosis not present

## 2023-05-12 HISTORY — DX: Occlusion and stenosis of unspecified vertebral artery: I65.09

## 2023-05-12 LAB — I-STAT CHEM 8, ED
BUN: 13 mg/dL (ref 6–20)
Calcium, Ion: 1.16 mmol/L (ref 1.15–1.40)
Chloride: 104 mmol/L (ref 98–111)
Creatinine, Ser: 1 mg/dL (ref 0.44–1.00)
Glucose, Bld: 99 mg/dL (ref 70–99)
HCT: 50 % — ABNORMAL HIGH (ref 36.0–46.0)
Hemoglobin: 17 g/dL — ABNORMAL HIGH (ref 12.0–15.0)
Potassium: 3.9 mmol/L (ref 3.5–5.1)
Sodium: 138 mmol/L (ref 135–145)
TCO2: 26 mmol/L (ref 22–32)

## 2023-05-12 LAB — DIFFERENTIAL
Abs Immature Granulocytes: 0.03 10*3/uL (ref 0.00–0.07)
Basophils Absolute: 0.1 10*3/uL (ref 0.0–0.1)
Basophils Relative: 1 %
Eosinophils Absolute: 0.1 10*3/uL (ref 0.0–0.5)
Eosinophils Relative: 1 %
Immature Granulocytes: 1 %
Lymphocytes Relative: 25 %
Lymphs Abs: 1.5 10*3/uL (ref 0.7–4.0)
Monocytes Absolute: 0.4 10*3/uL (ref 0.1–1.0)
Monocytes Relative: 6 %
Neutro Abs: 4.1 10*3/uL (ref 1.7–7.7)
Neutrophils Relative %: 66 %

## 2023-05-12 LAB — CBC
HCT: 47.5 % — ABNORMAL HIGH (ref 36.0–46.0)
Hemoglobin: 15.6 g/dL — ABNORMAL HIGH (ref 12.0–15.0)
MCH: 29.9 pg (ref 26.0–34.0)
MCHC: 32.8 g/dL (ref 30.0–36.0)
MCV: 91.2 fL (ref 80.0–100.0)
Platelets: 187 10*3/uL (ref 150–400)
RBC: 5.21 MIL/uL — ABNORMAL HIGH (ref 3.87–5.11)
RDW: 12.8 % (ref 11.5–15.5)
WBC: 6.2 10*3/uL (ref 4.0–10.5)
nRBC: 0 % (ref 0.0–0.2)

## 2023-05-12 LAB — APTT: aPTT: 30 seconds (ref 24–36)

## 2023-05-12 LAB — COMPREHENSIVE METABOLIC PANEL
ALT: 10 U/L (ref 0–44)
AST: 15 U/L (ref 15–41)
Albumin: 4 g/dL (ref 3.5–5.0)
Alkaline Phosphatase: 84 U/L (ref 38–126)
Anion gap: 11 (ref 5–15)
BUN: 12 mg/dL (ref 6–20)
CO2: 26 mmol/L (ref 22–32)
Calcium: 9.4 mg/dL (ref 8.9–10.3)
Chloride: 100 mmol/L (ref 98–111)
Creatinine, Ser: 0.99 mg/dL (ref 0.44–1.00)
GFR, Estimated: >60 mL/min (ref >60)
Glucose, Bld: 99 mg/dL (ref 70–99)
Potassium: 3.9 mmol/L (ref 3.5–5.1)
Sodium: 137 mmol/L (ref 135–145)
Total Bilirubin: 0.7 mg/dL (ref 0.3–1.2)
Total Protein: 7.5 g/dL (ref 6.5–8.1)

## 2023-05-12 LAB — ETHANOL: Alcohol, Ethyl (B): <10 mg/dL (ref <10)

## 2023-05-12 LAB — PROTIME-INR
INR: 0.9 (ref 0.8–1.2)
Prothrombin Time: 12.8 seconds (ref 11.4–15.2)

## 2023-05-12 MED ORDER — SODIUM CHLORIDE 0.9% FLUSH: 3.0000 mL | Freq: Once | INTRAVENOUS | Status: DC

## 2023-05-12 MED ORDER — IOHEXOL 350 MG/ML SOLN
75.0000 mL | Freq: Once | INTRAVENOUS | Status: AC | PRN
  Administered 2023-05-12: 75 mL via INTRAVENOUS

## 2023-05-12 NOTE — Discharge Instructions (Signed)
You were seen in the emergency department for dizziness, tremors, and numbness. Your symptoms resolved prior to arriving in the ER. Your labs and imaging were all reassuring from your workup today. I would advise following up with neurology for further evaluation of your symptoms. If your symptoms return, return to the ER for further evaluation.

## 2023-05-12 NOTE — ED Provider Notes (Signed)
 [place] EMERGENCY DEPARTMENT AT [facility] Provider Note   CSN: [id number] Arrival date & time: 05/12/23  1013     History Chief Complaint  Patient presents with   Dizziness   Tremors   Numbness    Kathy Stephens is a 62 y.o. female. Patient presents to the ED with concerns of dizzines, numbness, and tremor.  Patient reports that her symptoms typically occur with a somewhat rhythmic pattern with symptoms occurring at night.  Reports that last episode was around 2:30 AM this morning when she is experiencing some facial numbness as well as tremulousness in her upper and lower extremities bilaterally.  At this point, she reports all her symptoms have resolved and is no longer experiencing any numbness, dizziness, tremors.  No facial asymmetry noted in triage no asymmetry reported by patient or family.  No evidence of any slurred speech either prior to arriving here in the emergency department.  No prior history of any stroke or TIA.  Denies any current medication use and states that she typically avoids medications if possible.  Was seen by her acupuncturist earlier today who reportedly performed the Epley maneuver was unable to have patient's vertigo symptoms repeated.   Dizziness      Home Medications Prior to Admission medications   Medication Sig Start Date End Date Taking? Authorizing Provider  Cholecalciferol (VITAMIN D3) 50 MCG (2000 UT) CHEW Chew by mouth.    [provider]  cycloSPORINE (RESTASIS) 0.05 % ophthalmic emulsion 1 drop 2 (two) times daily.    [provider]  diphenhydrAMINE (BENADRYL) 25 mg capsule Take 25 mg by mouth every 6 (six) hours as needed for sleep.    [provider]  famotidine (PEPCID) 20 MG tablet Take 20 mg by mouth 2 (two) times daily.    [provider]  loperamide (IMODIUM A-D) 2 MG capsule Take by mouth as needed for diarrhea or loose stools.    [provider]  Magnesium 300 MG CAPS Take  by mouth.    [provider]      Allergies    Azithromycin    Review of Systems   Review of Systems  Neurological:  Positive for dizziness.  All other systems reviewed and are negative.   Physical Exam Updated Vital Signs BP (!) 154/77 (BP Location: Right Arm)   Pulse 68   Temp 98 F (36.7 C) (Oral)   Resp 20   Ht 5\' 2"  (1.575 m)   Wt 81.6 kg   LMP 11/03/2006   SpO2 99%   BMI 32.92 kg/m  Physical Exam Vitals and nursing note reviewed.  Constitutional:      General: She is not in acute distress.    Appearance: She is well-developed.  HENT:     Head: Normocephalic and atraumatic.  Eyes:     Conjunctiva/sclera: Conjunctivae normal.  Cardiovascular:     Rate and Rhythm: Normal rate and regular rhythm.     Heart sounds: No murmur heard. Pulmonary:     Effort: Pulmonary effort is normal. No respiratory distress.     Breath sounds: Normal breath sounds.  Abdominal:     Palpations: Abdomen is soft.     Tenderness: There is no abdominal tenderness.  Musculoskeletal:        General: No swelling.     Cervical back: Neck supple.  Skin:    General: Skin is warm and dry.     Capillary Refill: Capillary refill takes less than 2 seconds.  Neurological:     Mental Status: She is alert.     Cranial Nerves: Cranial nerves 2-12 are intact. No cranial nerve deficit or facial asymmetry.     Sensory: Sensation is intact.     Motor: Motor function is intact. No weakness or tremor.     Comments: Cranial nerves II through XII intact.  No obvious focal deficit noted in upper lower extremity sensation or strength testing.  No evidence of any facial droop or facial asymmetry.  No slurred speech.  Psychiatric:        Mood and Affect: Mood normal.     ED Results / Procedures / Treatments   Labs (all labs ordered are listed, but only abnormal results are displayed) Labs Reviewed  CBC - Abnormal; Notable for the following components:      Result Value   RBC 5.21 (*)     Hemoglobin 15.6 (*)    HCT 47.5 (*)    All other components within normal limits  I-STAT CHEM 8, ED - Abnormal; Notable for the following components:   Hemoglobin 17.0 (*)    HCT 50.0 (*)    All other components within normal limits  PROTIME-INR  APTT  DIFFERENTIAL  COMPREHENSIVE METABOLIC PANEL  ETHANOL  CBG MONITORING, ED    EKG None  Radiology CT ANGIO HEAD NECK W WO CM  Result Date: 05/12/2023 CLINICAL DATA:  Provided history: Vertigo, headache. Dizziness. Tremors. Numbness. EXAM: CT ANGIOGRAPHY HEAD AND NECK WITH AND WITHOUT CONTRAST TECHNIQUE: Multidetector CT imaging of the head and neck was performed using the standard protocol during bolus administration of intravenous contrast. Multiplanar CT image reconstructions and MIPs were obtained to evaluate the vascular anatomy. Carotid stenosis measurements (when applicable) are obtained utilizing NASCET criteria, using the distal internal carotid diameter as the denominator. RADIATION DOSE REDUCTION: This exam was performed according to the departmental dose-optimization program which includes automated exposure control, adjustment of the mA and/or kV according to patient size and/or use of iterative reconstruction technique. CONTRAST:  75mL OMNIPAQUE IOHEXOL 350 MG/ML SOLN COMPARISON:  Head CT 05/12/2023. FINDINGS: CTA NECK FINDINGS Aortic arch: The visualized thoracic aorta is normal in caliber. Streak/beam hardening artifact arising from a dense left-sided contrast bolus partially obscures the left subclavian artery. Within this limitation, there is no appreciable hemodynamically significant innominate or proximal subclavian artery stenosis. Right carotid system: CCA and ICA patent within the neck without stenosis. Minimal atherosclerotic plaque about the carotid bifurcation and within the proximal ICA. Left carotid system: Streak/beam hardening artifact arising from a dense left-sided contrast bolus partially obscures the proximal CCA.  Within this limitation, the CCA and ICA are patent within the neck without stenosis. Mild atherosclerotic plaque about the carotid bifurcation and within the proximal ICA. Vertebral arteries: Vertebral arteries codominant and patent within the neck. Severe stenosis at the origin of the right vertebral artery. Skeleton: No acute fracture or aggressive osseous lesion. Other neck: No neck mass or cervical lymphadenopathy. Upper chest: No consolidation within the imaged lung apices. Review of the MIP images confirms the above findings CTA HEAD FINDINGS Anterior circulation: The intracranial internal carotid arteries are patent. Atherosclerotic plaque within both vessels with sites of mild stenosis. No M2 proximal branch occlusion or high-grade proximal stenosis. Bilateral MCA distal branch atherosclerotic regularity. The anterior cerebral arteries are patent. Hypoplastic left A1 segment. No intracranial aneurysm is identified. Posterior circulation: The intracranial vertebral arteries are patent. Mild focal atherosclerotic narrowing within the intracranial left vertebral artery at the level of the  PICA origin. The basilar artery is patent. The posterior cerebral arteries are patent. Posterior communicating arteries are diminutive or absent, bilaterally. Venous sinuses: Within the limitations of contrast timing, no convincing thrombus. Anatomic variants: As described. Review of the MIP images confirms the above findings IMPRESSION: CTA neck: 1. Streak/beam hardening artifact arising from a dense left-sided contrast bolus partially obscures the proximal left common carotid artery. Within this limitation, the common carotid and internal carotid arteries are patent within the neck without stenosis. Mild atherosclerotic plaque about the carotid bifurcations and within the proximal ICAs. 2. The vertebral arteries are patent within the neck. Severe atherosclerotic narrowing at the origin of the right vertebral artery. CTA  head: Intracranial atherosclerotic disease as described. No intracranial large vessel occlusion or proximal high-grade arterial stenosis. Electronically Signed   By: Jackey Loge D.O.   On: 05/12/2023 15:20   CT HEAD WO CONTRAST  Result Date: 05/12/2023 CLINICAL DATA:  62-year-old female with intermittent headache and dizziness for 1 week. Left side pain, eyes fluttering, room spinning. EXAM: CT HEAD WITHOUT CONTRAST TECHNIQUE: Contiguous axial images were obtained from the base of the skull through the vertex without intravenous contrast. RADIATION DOSE REDUCTION: This exam was performed according to the departmental dose-optimization program which includes automated exposure control, adjustment of the mA and/or kV according to patient size and/or use of iterative reconstruction technique. COMPARISON:  None Available. FINDINGS: Brain: Normal cerebral volume for age. No midline shift, ventriculomegaly, mass effect, evidence of mass lesion, intracranial hemorrhage or evidence of cortically based acute infarction. Gray-white differentiation is within normal limits for age and there is no convincing encephalomalacia. Vascular: No suspicious intracranial vascular hyperdensity. Calcified atherosclerosis at the skull base. Skull: Negative. Sinuses/Orbits: Tympanic cavities, visualized paranasal sinuses and mastoids are clear. Other: Visualized orbits and scalp soft tissues are within normal limits. IMPRESSION: Normal for age noncontrast Head CT. Electronically Signed   By: Odessa Fleming M.D.   On: 05/12/2023 11:34    Procedures Procedures   Medications Ordered in ED Medications  iohexol (OMNIPAQUE) 350 MG/ML injection 75 mL (75 mLs Intravenous Contrast Given 05/12/23 1411)    ED Course/ Medical Decision Making/ A&P                           Medical Decision Making Amount and/or Complexity of Data Reviewed Labs: ordered. Radiology: ordered.  Risk Prescription drug management.   This patient presents to the  ED for concern of dizziness, numbness, tremor.  Differential diagnosis includes stroke, TIA, electrolyte abnormality, sepsis, pneumonia   Lab Tests:  I Ordered, and personally interpreted labs.  The pertinent results include: CBC, CMP unremarkable, ethanol negative, APTT and PT/INR normal   Imaging Studies ordered:  I ordered imaging studies including CT head, CT angio head and neck I independently visualized and interpreted imaging which showed no acute abnormality noted on CT head or CT angio. I agree with the radiologist interpretation   Problem List / ED Course:  Patient presents to the emergency department complaints of dizziness, numbness, tremors.  Reports an episode occurred around 2:30 AM.  States that the symptoms resolved after approximately 30 minutes.  Reports that at that time she was noticing some tremors, numbness, and reported facial droop according to husband.  But denies that husband noticed any slurring of speech or any alteration in speech.  States that the symptoms typically arise in the middle of the night which I find to be on presentation but she  does report that these are happening typically every night.  She denies any prior history of any stroke, TIA, or other evidence of any neurological abnormality.  Will evaluate with labs and imaging for further assessment. CT head negative for any acute abnormalities and labs all are largely unremarkable at this time.  Will order CT angio head and neck for any possible cause of symptoms MSK as the positional changes at night may be accounting for patient's symptoms.  No obvious or acute signs of stroke as patient is neurologically intact at this point with no acute deficits noted. CT angio head and neck also unremarkable for any acute abnormality there are some notable atherosclerosis in the right vertebral artery.  Discussed findings with patient and with shared decision making, patient preferred to discharge home and return if  she has any recurrence of symptoms.  Discussed benefits of possible admission for TIA evaluation versus discharge home with outpatient neurology follow-up.  At this point, we will plan on discharge with outpatient follow-up with patient with strict return precautions of any recurrence of symptoms return to emergency department.  Unclear exact of these are TIAs that patient has been experiencing as they appear to be somewhat predictable in nature with episodes occurring at night while she is sleeping.  ABCD 2 score at 3 which indicates patient is at low risk for stroke.  Will order outpatient neurology referral to [facility].  Patient is agreeable with treatment plan verbalized understanding all return precautions.  All questions answered prior to patient discharge.   Social Determinants of Health:  Likely has significant past medical history but reports that she does not typically seek any medical care  Final Clinical Impression(s) / ED Diagnoses Final diagnoses:  Dizziness  Paresthesia  Elevated blood pressure reading    Rx / DC Orders ED Discharge Orders          Ordered    Ambulatory referral to Neurology       Comments: An appointment is requested in approximately: 1 week   05/12/23 1615              Smitty Knudsen, PA-C 05/13/23 [id number]    Benjiman Core, MD 05/13/23 [phone]

## 2023-05-12 NOTE — ED Notes (Signed)
Patient transported to CT 

## 2023-05-12 NOTE — ED Notes (Signed)
Did EKG shown to Dr Hart Rochester got patient vitals patient is resting with call bell in reach

## 2023-05-12 NOTE — ED Notes (Signed)
Pt ambulated to bathroom with steady gait. 

## 2023-05-12 NOTE — ED Triage Notes (Signed)
Per EMS, Pt, from home, c/o intermittent headache and dizziness x1 week.  Pain score 5/10.  Pt reports episodes are preceded by L sided headache and eyes fluttering.  Pt sts "the room spins."  Pt reports R side facial numbness and BUE and BLE tremors during episodes this morning x2.  Pt reports first episode around 0230.  Pt reports numbness has resolved.    Facial symmetry noted and Pt denies weakness.

## 2023-05-13 ENCOUNTER — Other Ambulatory Visit: Payer: 59

## 2023-05-13 ENCOUNTER — Ambulatory Visit: Payer: 59 | Admitting: Diagnostic Neuroimaging

## 2023-05-13 ENCOUNTER — Encounter: Payer: Self-pay | Admitting: Diagnostic Neuroimaging

## 2023-05-13 VITALS — BP 169/88 | HR 66 | Ht 62.0 in | Wt 179.0 lb

## 2023-05-13 DIAGNOSIS — G459 Transient cerebral ischemic attack, unspecified: Secondary | ICD-10-CM

## 2023-05-13 HISTORY — DX: Transient cerebral ischemic attack, unspecified: G45.9

## 2023-05-13 MED ORDER — ATORVASTATIN CALCIUM 20 MG PO TABS: 20.0000 mg | ORAL_TABLET | Freq: Every day | ORAL | 12 refills | Status: DC

## 2023-05-13 MED ORDER — LISINOPRIL 5 MG PO TABS: 5.0000 mg | ORAL_TABLET | Freq: Every day | ORAL | 12 refills | Status: DC

## 2023-05-13 MED ORDER — ASPIRIN 81 MG PO TBEC: 81.0000 mg | DELAYED_RELEASE_TABLET | Freq: Every day | ORAL | 12 refills | Status: AC

## 2023-05-13 MED ORDER — CLOPIDOGREL BISULFATE 75 MG PO TABS: 75.0000 mg | ORAL_TABLET | Freq: Every day | ORAL | 0 refills | Status: AC

## 2023-05-13 NOTE — Progress Notes (Signed)
[facility]  PATIENT: Kathy Stephens DOB: 01/31/1961  REFERRING CLINICIAN: Smitty Knudsen, PA-C HISTORY FROM: patient  REASON FOR VISIT: new consult   HISTORICAL  CHIEF COMPLAINT:  Chief Complaint  Patient presents with   New Patient (Initial Visit)    Patient in room #7 with Kathy Stephens. Patient states she been feeling dizziness and would like to discuss Kathy poss. TIA.    HISTORY OF PRESENT ILLNESS:   62 year old female with history of hypertension, hyperlipidemia, tobacco abuse, here for evaluation of TIA.  Over the last 1-2 weeks patient has had onset of intermittent double vision, room spinning sensation, lasting minutes at a time.  Had some episodes of right facial weakness, right facial numbness, right arm and right leg involuntary movements.  Yesterday patient had severe symptoms and therefore went to emergency room for evaluation.  By the time she arrived Kathy symptoms had resolved.  CTA of the head and neck were obtained which showed some areas of atherosclerosis but no large vessel occlusions.  She was diagnosed with possible TIA and referred for outpatient follow-up.   REVIEW OF SYSTEMS: Full 14 system review of systems performed and negative with exception of: as per HPI.  ALLERGIES: Allergies  Allergen Reactions   Azithromycin Rash and Other (See Comments)    Tingly in face and lips    HOME MEDICATIONS: Outpatient Medications Prior to Visit  Medication Sig Dispense Refill   Cholecalciferol (VITAMIN D3) 50 MCG (2000 UT) CHEW Chew by mouth.     collagenase (SANTYL) 250 UNIT/GM ointment Apply 1 Application topically daily.     cycloSPORINE (RESTASIS) 0.05 % ophthalmic emulsion 1 drop 2 (two) times daily.     diphenhydrAMINE (BENADRYL) 25 mg capsule Take 25 mg by mouth every 6 (six) hours as needed for sleep.     Emollient (COLLAGEN EX) Take 20 mg by mouth daily.     famotidine (PEPCID) 20 MG tablet Take 20 mg by mouth 2 (two) times daily.      loperamide (IMODIUM A-D) 2 MG capsule Take by mouth as needed for diarrhea or loose stools.     Magnesium 300 MG CAPS Take by mouth.     No facility-administered medications prior to visit.    PAST MEDICAL HISTORY: Past Medical History:  Diagnosis Date   Allergy    Anemia    Anxiety    Cancer (HCC)    skin (nose & face)   Depression    Fibromyalgia 04/09/2021   GERD (gastroesophageal reflux disease)    Inflammatory bowel disease (ulcerative colitis) (HCC)    Migraines    while on Liada (mesalazine), not on it at this time   Osteopenia     PAST SURGICAL HISTORY: Past Surgical History:  Procedure Laterality Date   CESAREAN SECTION     CHOLECYSTECTOMY  03/19/2012   Procedure: LAPAROSCOPIC CHOLECYSTECTOMY;  Surgeon: Cherylynn Ridges, MD;  Location: [facility] OR;  Service: General;  Laterality: N/A;   COLONOSCOPY WITH PROPOFOL N/A 12/16/2022   Procedure: COLONOSCOPY WITH BIOPSY;  Surgeon: Toney Reil, MD;  Location: [facility];  Service: Endoscopy;  Laterality: N/A;   FOOT SURGERY  1980's   LAPAROSCOPY     with cystectomy   LEFT OOPHORECTOMY     tendon relief     TUBAL LIGATION      FAMILY HISTORY: Family History  Problem Relation Age of Onset   Breast cancer Mother 54   Hypertension Mother    Cancer Mother  Stroke Mother    Cancer Maternal Grandfather    Heart disease Maternal Grandfather    Heart disease Paternal Grandmother    Colon cancer Paternal Grandfather    Cancer Paternal Grandfather        testicular   Cancer Father        associated with colon polyps   Heart disease Father    Hyperlipidemia Father    Breast cancer Maternal Aunt     SOCIAL HISTORY: Social History   Socioeconomic History   Marital status: Married    Spouse name: Not on file   Number of children: 1   Years of education: Not on file   Highest education level: Not on file  Occupational History   Not on file  Tobacco Use   Smoking status: Every Day    Types: Cigars    Smokeless tobacco: Never   Tobacco comments:    5 daily  Vaping Use   Vaping Use: Never used  Substance and Sexual Activity   Alcohol use: Yes    Alcohol/week: 0.0 - 1.0 standard drinks of alcohol    Comment: socially   Drug use: No   Sexual activity: Yes    Partners: Male    Birth control/protection: Surgical    Comment: BTL  Other Topics Concern   Not on file  Social History Narrative   Not on file   Social Determinants of Health   Financial Resource Strain: Not on file  Food Insecurity: Not on file  Transportation Needs: Not on file  Physical Activity: Not on file  Stress: Not on file  Social Connections: Not on file  Intimate Partner Violence: Not on file     PHYSICAL EXAM  GENERAL EXAM/CONSTITUTIONAL: Vitals:  Vitals:   05/13/23 0946  BP: (!) 169/88  Pulse: 66  Weight: 179 lb (81.2 kg)  Height: 5\' 2"  (1.575 m)   Body mass index is 32.74 kg/m. Wt Readings from Last 3 Encounters:  05/13/23 179 lb (81.2 kg)  05/12/23 180 lb (81.6 kg)  01/20/23 185 lb 1.9 oz (84 kg)   Patient is in no distress; well developed, nourished and groomed; neck is supple  CARDIOVASCULAR: Examination of carotid arteries is normal; no carotid bruits Regular rate and rhythm, no murmurs Examination of peripheral vascular system by observation and palpation is normal  EYES: Ophthalmoscopic exam of optic discs and posterior segments is normal; no papilledema or hemorrhages No results found.  MUSCULOSKELETAL: Gait, strength, tone, movements noted in Neurologic exam below  NEUROLOGIC: MENTAL STATUS:      No data to display         awake, alert, oriented to person, place and time recent and remote memory intact normal attention and concentration language fluent, comprehension intact, naming intact fund of knowledge appropriate  CRANIAL NERVE:  2nd - no papilledema on fundoscopic exam 2nd, 3rd, 4th, 6th - pupils equal and reactive to light, visual fields full to  confrontation, extraocular muscles intact, no nystagmus 5th - facial sensation symmetric 7th - facial strength symmetric 8th - hearing intact 9th - palate elevates symmetrically, uvula midline 11th - shoulder shrug symmetric 12th - tongue protrusion midline  MOTOR:  normal bulk and tone, full strength in the BUE, BLE  SENSORY:  normal and symmetric to light touch, temperature, vibration  COORDINATION:  finger-nose-finger, fine finger movements normal  REFLEXES:  deep tendon reflexes TRACE and symmetric  GAIT/STATION:  narrow based gait     DIAGNOSTIC DATA (LABS, IMAGING, TESTING) - I  reviewed patient records, labs, notes, testing and imaging myself where available.  Lab Results  Component Value Date   WBC 6.2 05/12/2023   HGB 17.0 (H) 05/12/2023   HCT 50.0 (H) 05/12/2023   MCV 91.2 05/12/2023   PLT 187 05/12/2023      Component Value Date/Time   NA 138 05/12/2023 1120   NA 140 03/19/2022 1040   K 3.9 05/12/2023 1120   CL 104 05/12/2023 1120   CO2 26 05/12/2023 1042   GLUCOSE 99 05/12/2023 1120   BUN 13 05/12/2023 1120   BUN 8 03/19/2022 1040   CREATININE 1.00 05/12/2023 1120   CREATININE 0.81 02/03/2013 1224   CALCIUM 9.4 05/12/2023 1042   PROT 7.5 05/12/2023 1042   PROT 7.0 03/19/2022 1040   ALBUMIN 4.0 05/12/2023 1042   ALBUMIN 4.2 03/19/2022 1040   AST 15 05/12/2023 1042   ALT 10 05/12/2023 1042   ALKPHOS 84 05/12/2023 1042   BILITOT 0.7 05/12/2023 1042   BILITOT 0.2 03/19/2022 1040   GFRNONAA >60 05/12/2023 1042   GFRAA 70 11/27/2017 1751   Lab Results  Component Value Date   CHOL 259 (H) 03/29/2021   HDL 56 03/29/2021   LDLCALC 169 (H) 03/29/2021   TRIG 185 (H) 03/29/2021   CHOLHDL 4.6 (H) 03/29/2021   No results found for: "HGBA1C" No results found for: "VITAMINB12" Lab Results  Component Value Date   TSH 2.110 04/09/2021    04/03/22 TTE 1. Left ventricular ejection fraction, by estimation, is 60 to 65%. The  left ventricle has normal  function. The left ventricle has no regional  wall motion abnormalities. There is mild left ventricular hypertrophy.  Left ventricular diastolic parameters  were normal.   2. Right ventricular systolic function is normal. The right ventricular  size is normal.   3. The mitral valve is normal in structure. No evidence of mitral valve  regurgitation. No evidence of mitral stenosis.   4. The aortic valve is normal in structure. Aortic valve regurgitation is  not visualized. No aortic stenosis is present.   5. The inferior vena cava is normal in size with greater than 50%  respiratory variability, suggesting right atrial pressure of 3 mmHg.   05/12/23 CTA neck: 1. Streak/beam hardening artifact arising from a dense left-sided contrast bolus partially obscures the proximal left common carotid artery. Within this limitation, the common carotid and internal carotid arteries are patent within the neck without stenosis. Mild atherosclerotic plaque about the carotid bifurcations and within the proximal ICAs. 2. The vertebral arteries are patent within the neck. Severe atherosclerotic narrowing at the origin of the right vertebral artery.   05/12/23 CTA head: -Intracranial atherosclerotic disease as described. No intracranial large vessel occlusion or proximal high-grade arterial stenosis.    ASSESSMENT AND PLAN  62 y.o. year old female here with:   Dx:  1. TIA (transient ischemic attack)      PLAN:  Multiple TIAs (since ~May 04, 2023; double vision, vertigo, dizziness, right-sided numbness, right-sided weakness; concerning for vertebrobasilar TIAs in the setting of severe right vertebral artery atherosclerotic narrowing at the origin, and vascular risk factors of hypertension, hyperlipidemia, tobacco abuse) - check MRI brain - start aspirin 81mg  daily (long term) - start clopidogrel 75mg  daily x 3 weeks (to complete dual anti-platelet therapy for 3 weeks) - start atorvastatin 40mg   daily - start lisinopril 5mg  daily - cardiac monitoring (afib evaluation) - smoking cessation discussed; strongly encouraged; patient will think about it - follow up with PCP  asap  Orders Placed This Encounter  Procedures   MR BRAIN WO CONTRAST   Cardiac event monitor   Meds ordered this encounter  Medications   aspirin EC 81 MG tablet    Sig: Take 1 tablet (81 mg total) by mouth daily. Swallow whole.    Dispense:  30 tablet    Refill:  12   clopidogrel (PLAVIX) 75 MG tablet    Sig: Take 1 tablet (75 mg total) by mouth daily for 21 days.    Dispense:  21 tablet    Refill:  0   atorvastatin (LIPITOR) 20 MG tablet    Sig: Take 1 tablet (20 mg total) by mouth daily.    Dispense:  30 tablet    Refill:  12   lisinopril (ZESTRIL) 5 MG tablet    Sig: Take 1 tablet (5 mg total) by mouth daily.    Dispense:  30 tablet    Refill:  12   Return for pending test results, pending if symptoms worsen or fail to improve.  I reviewed images, labs, notes, records myself. I summarized findings and reviewed with patient, for this high risk condition (TIA) requiring high complexity decision making.    Suanne Marker, MD 05/13/2023, 10:47 AM Certified in Neurology, Neurophysiology and Neuroimaging  [facility] [address], [address] [place], [place] [address] [phone]

## 2023-05-13 NOTE — Patient Instructions (Signed)
Multiple TIA (since June 2024) - check MRI brain - start aspirin 81mg  daily (long term) - start clopidogrel 75mg  daily x 3 weeks - start atorvastatin 40mg  daily - start lisinopril 5mg  daily - cardiac monitoring (afib evaluation) - smoking cessation discussed - follow up with PCP asap

## 2023-05-14 ENCOUNTER — Telehealth: Payer: Self-pay | Admitting: Diagnostic Neuroimaging

## 2023-05-14 NOTE — Telephone Encounter (Signed)
Pt scheduled for 30 mins MR brain wo contrast at [facility] for 05/19/23 at 10:30am  [facility] NPR case # [id number]

## 2023-05-19 ENCOUNTER — Telehealth: Payer: Self-pay | Admitting: Diagnostic Neuroimaging

## 2023-05-19 ENCOUNTER — Ambulatory Visit: Payer: 59

## 2023-05-19 DIAGNOSIS — G459 Transient cerebral ischemic attack, unspecified: Secondary | ICD-10-CM

## 2023-05-19 NOTE — Telephone Encounter (Signed)
Order needs changed to [facility]

## 2023-05-19 NOTE — Telephone Encounter (Signed)
New order has been placed for [facility]

## 2023-05-19 NOTE — Addendum Note (Signed)
Addended by: Berna Spare A on: 05/19/2023 03:02 PM   Modules accepted: Orders

## 2023-05-22 ENCOUNTER — Encounter: Payer: 59 | Admitting: Nurse Practitioner

## 2023-05-23 ENCOUNTER — Encounter: Payer: Self-pay | Admitting: Diagnostic Neuroimaging

## 2023-05-25 ENCOUNTER — Other Ambulatory Visit: Payer: Self-pay | Admitting: Diagnostic Neuroimaging

## 2023-05-25 DIAGNOSIS — G459 Transient cerebral ischemic attack, unspecified: Secondary | ICD-10-CM

## 2023-05-25 DIAGNOSIS — R42 Dizziness and giddiness: Secondary | ICD-10-CM

## 2023-05-27 ENCOUNTER — Encounter: Payer: 59 | Admitting: Family Medicine

## 2023-05-29 ENCOUNTER — Other Ambulatory Visit: Payer: Self-pay | Admitting: Family Medicine

## 2023-05-29 DIAGNOSIS — R42 Dizziness and giddiness: Secondary | ICD-10-CM | POA: Diagnosis not present

## 2023-05-29 DIAGNOSIS — G459 Transient cerebral ischemic attack, unspecified: Secondary | ICD-10-CM

## 2023-05-29 DIAGNOSIS — Z1231 Encounter for screening mammogram for malignant neoplasm of breast: Secondary | ICD-10-CM

## 2023-06-04 ENCOUNTER — Ambulatory Visit: Payer: 59

## 2023-06-29 ENCOUNTER — Ambulatory Visit: Payer: 59 | Admitting: Family Medicine

## 2023-06-29 ENCOUNTER — Encounter: Payer: Self-pay | Admitting: Family Medicine

## 2023-06-29 VITALS — BP 136/77 | HR 64 | Ht 62.0 in | Wt 175.4 lb

## 2023-06-29 DIAGNOSIS — R7989 Other specified abnormal findings of blood chemistry: Secondary | ICD-10-CM | POA: Diagnosis not present

## 2023-06-29 DIAGNOSIS — I6501 Occlusion and stenosis of right vertebral artery: Secondary | ICD-10-CM | POA: Diagnosis not present

## 2023-06-29 DIAGNOSIS — G459 Transient cerebral ischemic attack, unspecified: Secondary | ICD-10-CM

## 2023-06-29 DIAGNOSIS — F17210 Nicotine dependence, cigarettes, uncomplicated: Secondary | ICD-10-CM

## 2023-06-29 NOTE — Progress Notes (Addendum)
   Established Patient Office Visit  Subjective   Patient ID: Kathy Stephens, female    DOB: 1961/10/14  Age: 62 y.o. MRN: [id number]  Chief Complaint  Patient presents with   Medical Management of Chronic Issues    HPI  TIA -patient currently taking aspirin, statin, lisinopril.  No longer taking the Plavix as she has finished her course.  She noticed that she was having dizziness when taking her lisinopril every day but would not describe it as vertigo.  She started taking lisinopril every other day and noticed her symptoms improved.  Has not had recurrence of the symptoms since going to every other day.  Just described it as a "loopiness". 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  States they never found out what caused her splenic infarction.  Patient has a maternal history of CVA and paternal history of CAD.  Patient quit smoking last Tuesday.  She was smoking 5 cigarillos a day, 'al capones'.     The 10-year ASCVD risk score (Arnett DK, et al., 2019) is: 9.2%  Health Maintenance Due  Topic Date Due   HIV Screening  Never done   Hepatitis C Screening  Never done   DTaP/Tdap/Td (2 - Td or Tdap) 05/23/2020   COVID-19 Vaccine (1 - 2023-24 season) Never done   INFLUENZA VACCINE  06/04/2023      Objective:     BP 136/77   Pulse 64   Ht 5\' 2"  (1.575 m)   Wt 175 lb 6.4 oz (79.6 kg)   LMP 11/03/2006   SpO2 100%   BMI 32.08 kg/m    Physical Exam General: Alert, oriented CV: Regular rate and rhythm Pulmonary: Lungs are bilaterally MSK: Normal gait.   No results found for any visits on 06/29/23.      Assessment & Plan:   TIA (transient ischemic attack) Assessment & Plan: Continue aspirin and statin. - Follow-up cholesterol level - Follow-up A1c - Refer to vascular surgery for evaluation of vertebral artery stenosis - Continue blood pressure control with goal of 130/80 or less.  Orders: -     Lipid panel; Future -      Hemoglobin A1c; Future  Elevated homocysteine -     B12 and Folate Panel; Future  Cigarette smoker Assessment & Plan: Quit 1 week ago.  Encouraged continued cessation.   Stenosis of right vertebral artery Assessment & Plan: Seen on CTA head and neck after recent TIA.   - risk factor mitigation as per TIA recommendations - referral to vascular surgery to evaluate for appropriateness of surgical intervention  Orders: -     Ambulatory referral to Vascular Surgery     Return in about 4 weeks (around 07/27/2023) for TIA.    Sandre Kitty, MD

## 2023-06-29 NOTE — Patient Instructions (Addendum)
It was nice to see you today,  We addressed the following topics today: -I would like you to start taking your lisinopril once a day.  If you get dizzy again let us know and we can switch to a different medication. - Please schedule a lab appointment for your lab work. - Continue taking aspirin and your statin. - I will send in a referral to a vascular specialist regarding your vertebral stenosis.  Have a great day,  Frederic Jericho, MD

## 2023-06-29 NOTE — Assessment & Plan Note (Signed)
Quit 1 week ago.  Encouraged continued cessation.

## 2023-06-29 NOTE — Assessment & Plan Note (Signed)
Continue aspirin and statin. - Follow-up cholesterol level - Follow-up A1c - Refer to vascular surgery for evaluation of vertebral artery stenosis - Continue blood pressure control with goal of 130/80 or less.

## 2023-06-30 ENCOUNTER — Ambulatory Visit: Payer: 59 | Attending: Diagnostic Neuroimaging

## 2023-06-30 DIAGNOSIS — G459 Transient cerebral ischemic attack, unspecified: Secondary | ICD-10-CM

## 2023-06-30 DIAGNOSIS — I6501 Occlusion and stenosis of right vertebral artery: Secondary | ICD-10-CM | POA: Insufficient documentation

## 2023-06-30 DIAGNOSIS — R42 Dizziness and giddiness: Secondary | ICD-10-CM

## 2023-06-30 NOTE — Assessment & Plan Note (Signed)
Seen on CTA head and neck after recent TIA.   - risk factor mitigation as per TIA recommendations - referral to vascular surgery to evaluate for appropriateness of surgical intervention

## 2023-06-30 NOTE — Addendum Note (Signed)
Addended by: Sandre Kitty on: 06/30/2023 08:13 AM   Modules accepted: Orders

## 2023-07-02 ENCOUNTER — Other Ambulatory Visit: Payer: 59

## 2023-07-02 DIAGNOSIS — R7989 Other specified abnormal findings of blood chemistry: Secondary | ICD-10-CM

## 2023-07-02 DIAGNOSIS — G459 Transient cerebral ischemic attack, unspecified: Secondary | ICD-10-CM

## 2023-07-03 LAB — LIPID PANEL
Chol/HDL Ratio: 3.1 ratio (ref 0.0–4.4)
Cholesterol, Total: 172 mg/dL (ref 100–199)
HDL: 55 mg/dL
LDL Chol Calc (NIH): 91 mg/dL (ref 0–99)
Triglycerides: 150 mg/dL — ABNORMAL HIGH (ref 0–149)
VLDL Cholesterol Cal: 26 mg/dL (ref 5–40)

## 2023-07-03 LAB — B12 AND FOLATE PANEL
Folate: 6.4 ng/mL
Vitamin B-12: 292 pg/mL (ref 232–1245)

## 2023-07-03 LAB — HEMOGLOBIN A1C
Est. average glucose Bld gHb Est-mCnc: 114 mg/dL
Hgb A1c MFr Bld: 5.6 % (ref 4.8–5.6)

## 2023-07-07 ENCOUNTER — Ambulatory Visit: Payer: 59

## 2023-07-09 ENCOUNTER — Ambulatory Visit
Admission: RE | Admit: 2023-07-09 | Discharge: 2023-07-09 | Disposition: A | Discharge status: Home / Self Care | Payer: 59 | Source: Ambulatory Visit | Attending: Family Medicine | Admitting: Family Medicine

## 2023-07-09 DIAGNOSIS — Z1231 Encounter for screening mammogram for malignant neoplasm of breast: Secondary | ICD-10-CM

## 2023-07-27 ENCOUNTER — Ambulatory Visit: Payer: 59 | Admitting: Family Medicine

## 2023-08-05 ENCOUNTER — Encounter: Payer: Self-pay | Admitting: Vascular Surgery

## 2023-08-05 ENCOUNTER — Ambulatory Visit: Payer: 59 | Admitting: Vascular Surgery

## 2023-08-05 ENCOUNTER — Encounter: Payer: Self-pay | Admitting: Diagnostic Neuroimaging

## 2023-08-05 VITALS — BP 122/82 | HR 58 | Temp 97.8°F | Resp 20 | Ht 62.0 in | Wt 175.0 lb

## 2023-08-05 DIAGNOSIS — G459 Transient cerebral ischemic attack, unspecified: Secondary | ICD-10-CM | POA: Diagnosis not present

## 2023-08-05 DIAGNOSIS — I6501 Occlusion and stenosis of right vertebral artery: Secondary | ICD-10-CM

## 2023-08-05 NOTE — Progress Notes (Signed)
Patient ID: Kathy Stephens, female   DOB: 10/27/1961, 62 y.o.   MRN: [id number]  Reason for Consult: Follow-up   Referred by Sandre Kitty, MD  Subjective:     HPI:  Kathy Stephens is a 62 y.o. female I have previously seen for splenic infarction of unknown etiology.  She is now sent for evaluation after having an episode of dizziness with inability to use her right upper extremity and also was having speech difficulties.  There was consideration of underlying vertigo versus TIA and she underwent CT angio.  She denies any previous symptoms although she has had chronic headaches they are better controlled now that she is controlling her hypertension.  She specifically has no previous history of stroke.  She was initially placed on Plavix she is now on aspirin and a statin.  She is found to have vertebral artery stenosis for which she now follows up.  She states that she has been worked up from a cardiac standpoint and this was negative.  Past Medical History:  Diagnosis Date   Allergy    Anemia    Anxiety    Cancer (HCC)    skin (nose & face)   Depression    Fibromyalgia 04/09/2021   GERD (gastroesophageal reflux disease)    Inflammatory bowel disease (ulcerative colitis) (HCC)    Migraines    while on Liada (mesalazine), not on it at this time   Osteopenia    Family History  Problem Relation Age of Onset   Breast cancer Mother 54   Hypertension Mother    Cancer Mother    Stroke Mother    Cancer Maternal Grandfather    Heart disease Maternal Grandfather    Heart disease Paternal Grandmother    Colon cancer Paternal Grandfather    Cancer Paternal Grandfather        testicular   Cancer Father        associated with colon polyps   Heart disease Father    Hyperlipidemia Father    Breast cancer Maternal Aunt    Past Surgical History:  Procedure Laterality Date   CESAREAN SECTION     CHOLECYSTECTOMY  03/19/2012   Procedure: LAPAROSCOPIC CHOLECYSTECTOMY;  Surgeon: Cherylynn Ridges, MD;  Location: [facility] OR;  Service: General;  Laterality: N/A;   COLONOSCOPY WITH PROPOFOL N/A 12/16/2022   Procedure: COLONOSCOPY WITH BIOPSY;  Surgeon: Toney Reil, MD;  Location: [facility];  Service: Endoscopy;  Laterality: N/A;   FOOT SURGERY  1980's   LAPAROSCOPY     with cystectomy   LEFT OOPHORECTOMY     tendon relief     TUBAL LIGATION      Short Social History:  Social History   Tobacco Use   Smoking status: Every Day    Types: Cigars   Smokeless tobacco: Never   Tobacco comments:    5 daily  Substance Use Topics   Alcohol use: Yes    Alcohol/week: 0.0 - 1.0 standard drinks of alcohol    Comment: socially    Allergies  Allergen Reactions   Azithromycin Rash and Other (See Comments)    Tingly in face and lips    Current Outpatient Medications  Medication Sig Dispense Refill   aspirin EC 81 MG tablet Take 1 tablet (81 mg total) by mouth daily. Swallow whole. 30 tablet 12   atorvastatin (LIPITOR) 20 MG tablet Take 1 tablet (20 mg total) by mouth daily. 30 tablet 12   Cholecalciferol (VITAMIN  D3) 50 MCG (2000 UT) CHEW Chew by mouth.     cycloSPORINE (RESTASIS) 0.05 % ophthalmic emulsion 1 drop 2 (two) times daily.     diphenhydrAMINE (BENADRYL) 25 mg capsule Take 25 mg by mouth every 6 (six) hours as needed for sleep.     Emollient (COLLAGEN EX) Take 20 mg by mouth daily.     famotidine (PEPCID) 20 MG tablet Take 20 mg by mouth 2 (two) times daily.     lisinopril (ZESTRIL) 5 MG tablet Take 1 tablet (5 mg total) by mouth daily. 30 tablet 12   loperamide (IMODIUM A-D) 2 MG capsule Take by mouth as needed for diarrhea or loose stools.     Magnesium 300 MG CAPS Take by mouth.     No current facility-administered medications for this visit.    Review of Systems  Constitutional:  Constitutional negative. HENT: HENT negative.  Eyes: Eyes negative.  Respiratory: Respiratory negative.  Cardiovascular: Cardiovascular negative.  GI: Gastrointestinal  negative.  Musculoskeletal:       Neck pain Skin: Skin negative.  Neurological: Positive for dizziness, focal weakness, headaches, numbness and speech difficulty.  Hematologic: Hematologic/lymphatic negative.  Psychiatric: Psychiatric negative.        Objective:  Objective   Vitals:   08/05/23 1401  BP: 122/82  Pulse: (!) 58  Resp: 20  Temp: 97.8 F (36.6 C)  SpO2: 99%  Weight: 175 lb (79.4 kg)  Height: 5\' 2"  (1.575 m)   Body mass index is 32.01 kg/m.  Physical Exam HENT:     Head: Normocephalic.     Nose: Nose normal.  Eyes:     Pupils: Pupils are equal, round, and reactive to light.  Neck:     Vascular: No carotid bruit.  Cardiovascular:     Rate and Rhythm: Normal rate.  Pulmonary:     Effort: Pulmonary effort is normal.  Abdominal:     General: Abdomen is flat.  Musculoskeletal:        General: Normal range of motion.     Cervical back: Normal range of motion and neck supple.     Right lower leg: No edema.     Left lower leg: No edema.  Skin:    General: Skin is warm.     Capillary Refill: Capillary refill takes less than 2 seconds.  Neurological:     General: No focal deficit present.     Mental Status: She is alert.  Psychiatric:        Mood and Affect: Mood normal.        Thought Content: Thought content normal.        Judgment: Judgment normal.     Data: IMPRESSION: CTA neck:   1. Streak/beam hardening artifact arising from a dense left-sided contrast bolus partially obscures the proximal left common carotid artery. Within this limitation, the common carotid and internal carotid arteries are patent within the neck without stenosis. Mild atherosclerotic plaque about the carotid bifurcations and within the proximal ICAs. 2. The vertebral arteries are patent within the neck. Severe atherosclerotic narrowing at the origin of the right vertebral artery.       Assessment/Plan:    62 year old female with history as above now with splenic  infarction as well as what appeared to be a TIA certainly concerning for cardiac source but this has never been isolated.  She has minimal stenosis of the right vertebral artery looks really like a narrowing of the artery rather than any specific plaque as such  I do not think she needs any intervention at this time.  She continues aspirin and statin.  Follow-up in 1 year carotid duplex at that time with no stenosis noted and vertebral artery flow is antegrade she could have longer interval follow-up.    All questions answered today in the presence of her husband.     Maeola Harman MD [facility]

## 2023-08-06 ENCOUNTER — Encounter: Payer: Self-pay | Admitting: Neurology

## 2023-08-11 ENCOUNTER — Ambulatory Visit: Payer: 59 | Admitting: Family Medicine

## 2023-08-11 ENCOUNTER — Encounter: Payer: Self-pay | Admitting: Family Medicine

## 2023-08-11 VITALS — BP 162/85 | HR 56 | Ht 62.0 in | Wt 174.4 lb

## 2023-08-11 DIAGNOSIS — R42 Dizziness and giddiness: Secondary | ICD-10-CM | POA: Insufficient documentation

## 2023-08-11 DIAGNOSIS — I10 Essential (primary) hypertension: Secondary | ICD-10-CM | POA: Diagnosis not present

## 2023-08-11 NOTE — Assessment & Plan Note (Signed)
Patient's blood pressure is elevated today in the office.  She is taking her lisinopril daily.  Her blood pressure is elevated in the office today.  She states that she checks it at home and it is regularly between 115 and 125 systolic.  Given her complaints of lightheadedness I am hesitant to start a additional medication or increase her current medication with those reported home values.  I asked her to record her blood pressure twice a day for the next week, come into the office for a repeat blood pressure check with a nurse visit, and then provide those values to [place] at that time.  She will also follow-up with me in 1 month.  If at the time of the nurse visit she brings in evidence of elevated readings at home, we can adjust her medication appropriately.

## 2023-08-11 NOTE — Assessment & Plan Note (Signed)
Appears to have component of orthostasis (occurs after bending over) but also occurs when she has been ambulating for a while.  Echo from last year did not show any valvular lesions and nothing other than ventricular hypertrophy.  Recent cardiac monitoring was normal.  Likely related to her repeated TIAs/vertebral stenosis.  Advised her on modifications she can do including rising slowly from seated or bending over positions, standing for at least 5 to 10 seconds prior to trying to ambulate after standing up.  Recommended vestibular PT but patient declined.  She is doing "chair yoga" with the intent on progressing to regular yoga

## 2023-08-11 NOTE — Patient Instructions (Signed)
It was nice to see you today,  We addressed the following topics today: -Your blood pressure was elevated today.  I would like to have you check it at home twice a day and record the values and then come back in approximately 1 week to have your blood pressure rechecked again here.  At that time you can bring [place] back your values at home and we can document them. - Based on those values we can determine if you need to start an additional medication. - I will write a letter saying it is okay for you to have the dental procedure - Follow-up with me in 1 month  Have a great day,  Frederic Jericho, MD

## 2023-08-11 NOTE — Progress Notes (Signed)
Established Patient Office Visit  Subjective   Patient ID: Kathy Stephens, female    DOB: 07/26/1961  Age: 62 y.o. MRN: [id number]  Chief Complaint  Patient presents with   Medical Management of Chronic Issues    HPI  TIA-patient has seen the vascular surgeon.  They did not recommend any procedural intervention.  Recommend following up in 1 year.  Hypertension-patient taking her lisinopril daily.  She feels like her lightheadedness is improving, definitely not as frequent as before.  She checks her blood pressure at home and says it is generally in the 115s to 125 range.  It was elevated today and patient attributes to white coat hypertension.  Patient doing yoga currently.  Sometimes she gets lightheaded when bending over during yoga.  She was recently made aware of chair yoga.  She is going to start doing this and then try regular yoga after a few months of chair yoga.  We discussed vestibular physical therapy with the patient.  She declines at at this time.  Patient talks about needing medical clearance for dental surgery.  She states that she is awaiting the neurologist office to call her to pick up the paperwork.  We discussed continuing aspirin throughout minor procedures such as dental procedures.  We discussed the results of her cardiac monitoring which were normal.  We discussed her echo from last year.  I advised patient I would provide her with a note for her dentist saying she is cleared for dental procedure.  Patient has tooth pain that she takes 1000 mg Tylenol at night.  Occasionally takes ibuprofen as well.  She has tried Orajel in the past and it is effective but makes her mouth numb and she has not the way that feels.   The 10-year ASCVD risk score (Arnett DK, et al., 2019) is: 12.9%  Health Maintenance Due  Topic Date Due   HIV Screening  Never done   Hepatitis C Screening  Never done   DTaP/Tdap/Td (2 - Td or Tdap) 05/23/2020   INFLUENZA VACCINE  Never done    COVID-19 Vaccine (1 - 2023-24 season) Never done      Objective:     BP (!) 162/85   Pulse (!) 56   Ht 5\' 2"  (1.575 m)   Wt 174 lb 6.4 oz (79.1 kg)   LMP 11/03/2006   SpO2 100%   BMI 31.90 kg/m    Physical Exam General: Alert, oriented CV: Regular rate rhythm Pulmonary: Lungs are bilaterally MSK: Normal gait.  No ataxia.   No results found for any visits on 08/11/23.      Assessment & Plan:   Primary hypertension Assessment & Plan: Patient's blood pressure is elevated today in the office.  She is taking her lisinopril daily.  Her blood pressure is elevated in the office today.  She states that she checks it at home and it is regularly between 115 and 125 systolic.  Given her complaints of lightheadedness I am hesitant to start a additional medication or increase her current medication with those reported home values.  I asked her to record her blood pressure twice a day for the next week, come into the office for a repeat blood pressure check with a nurse visit, and then provide those values to [place] at that time.  She will also follow-up with me in 1 month.  If at the time of the nurse visit she brings in evidence of elevated readings at home, we can adjust her  medication appropriately.   Dizziness Assessment & Plan: Appears to have component of orthostasis (occurs after bending over) but also occurs when she has been ambulating for a while.  Echo from last year did not show any valvular lesions and nothing other than ventricular hypertrophy.  Recent cardiac monitoring was normal.  Likely related to her repeated TIAs/vertebral stenosis.  Advised her on modifications she can do including rising slowly from seated or bending over positions, standing for at least 5 to 10 seconds prior to trying to ambulate after standing up.  Recommended vestibular PT but patient declined.  She is doing "chair yoga" with the intent on progressing to regular yoga      Return in about 4 weeks  (around 09/08/2023) for HTN.    Sandre Kitty, MD

## 2023-08-18 ENCOUNTER — Ambulatory Visit (INDEPENDENT_AMBULATORY_CARE_PROVIDER_SITE_OTHER): Payer: 59 | Admitting: Family Medicine

## 2023-08-18 ENCOUNTER — Other Ambulatory Visit: Payer: Self-pay | Admitting: Family Medicine

## 2023-08-18 VITALS — BP 169/96 | HR 63 | Resp 20 | Ht 62.0 in | Wt 172.0 lb

## 2023-08-18 DIAGNOSIS — I10 Essential (primary) hypertension: Secondary | ICD-10-CM

## 2023-08-18 MED ORDER — LISINOPRIL-HYDROCHLOROTHIAZIDE 10-12.5 MG PO TABS
1.0000 | ORAL_TABLET | Freq: Every day | ORAL | 3 refills | Status: DC
Start: 2023-08-18 — End: 2023-09-14

## 2023-08-18 NOTE — Progress Notes (Signed)
Pt denies CP, SOB, dizziness, or heart palpitations. taking meds as directed without problems. Denies med side effects. 5 min spent with pt.   Pt reports previous bp readings of  08/11/23    (AM) 141/83 (1st reading), 146/80  (2nd reading) (PM) 141/82, 125/81  08/12/23  (AM) 147/89, 144/88 (PM) 174/91, 135/79  08/13/23  (AM) 159/96, 165/96  08/14/23  (AM) 134/85, 125/81  08/16/23  (AM) 175/98, 149/95 (PM) 138/83, 129/79  08/17/23  (AM) 115/78, 126/76 (PM) 152/82, 148/80

## 2023-08-18 NOTE — Progress Notes (Signed)
Spoke with the patient over the phone.  She is okay with increasing lisinopril to lisinopril-HCTZ 10/12.5.

## 2023-08-26 ENCOUNTER — Other Ambulatory Visit: Payer: Self-pay

## 2023-08-26 DIAGNOSIS — G459 Transient cerebral ischemic attack, unspecified: Secondary | ICD-10-CM

## 2023-09-08 ENCOUNTER — Encounter: Payer: Self-pay | Admitting: Family Medicine

## 2023-09-08 ENCOUNTER — Ambulatory Visit: Payer: 59 | Admitting: Family Medicine

## 2023-09-08 VITALS — BP 111/76 | HR 66 | Ht 62.0 in | Wt 167.4 lb

## 2023-09-08 DIAGNOSIS — R252 Cramp and spasm: Secondary | ICD-10-CM | POA: Diagnosis not present

## 2023-09-08 DIAGNOSIS — I10 Essential (primary) hypertension: Secondary | ICD-10-CM | POA: Diagnosis not present

## 2023-09-08 NOTE — Assessment & Plan Note (Signed)
Did not tolerate magnesium oxide due to diarrhea.  Discussed Slow-Mag, topical formulations of magnesium, and trying co-Q10 in case statins are contributing

## 2023-09-08 NOTE — Assessment & Plan Note (Signed)
Patient did not tolerate the switch from lisinopril to lisinopril-HCTZ.  She had increase in dizziness and weakness.  2 episodes of feeling clammy and lightheaded.  Associated with hypotension at the time. - We will go back to lisinopril 10 mg only - Continue checking home blood pressure readings - Follow-up in 1 month and at that time can discuss twice daily lisinopril or increasing dose slightly.  Would avoid increasing dose significantly or adding a new medication prior to adjusting lisinopril.

## 2023-09-08 NOTE — Patient Instructions (Signed)
It was nice to see you today,  We addressed the following topics today: -You can go back to taking the lisinopril only at 10 mg daily.  We will follow-up in 1 month and check your home blood pressure readings again.  At that time we can discuss whether or not to change the dose of lisinopril but for now you can stop taking the current lisinopril-HCTZ medication - Other forms of magnesium include a over-the-counter supplement called Slow-Mag that is designed to help more with decreasing diarrhea as a side effect.  You can try the magnesium cream if you would like.  I am not sure how much evidence the cream has for effectiveness - You can also take over-the-counter co-Q10 200 mg daily.  If this is related to statins, it might help your cramps.    Have a great day,  Frederic Jericho, MD

## 2023-09-08 NOTE — Progress Notes (Signed)
   Established Patient Office Visit  Subjective   Patient ID: Kathy Stephens, female    DOB: [birth year]  Age: 62 y.o. MRN: [id number]  Chief Complaint  Patient presents with   Medical Management of Chronic Issues    HPI Patient brought in a list of blood pressure readings from home.  She feels like her blood pressure is "too low".  She feels more lightheaded and dizzy more often.  She had an episode where she went grocery shopping and could not finish and had to have someone help her bring her groceries in because of the weakness/lightheadedness.  She has had 2 episodes where she is felt like this along with feeling "clammy".  Home blood pressure readings include several readings between 101 120 systolic.  There was 1 episode of dizziness associated with a blood pressure of 87/64.  Patient has had her dental procedure done.  No complications from this.  Patient stopped taking her magnesium due to diarrhea as a side effect.  We discussed alternatives.  States she had a compounded magnesium supplement when she was on vacation in [place] and this worked very effectively but she does not remember the formulation.  We discussed Slow-Mag, magnesium topical which the patient asked about, and co-Q10 in case this is related to statins.  Cramping is mostly first few minutes of laying in bed at night.   The 10-year ASCVD risk score (Arnett DK, et al., 2019) is: 6.2%  Health Maintenance Due  Topic Date Due   HIV Screening  Never done   Hepatitis C Screening  Never done   DTaP/Tdap/Td (2 - Td or Tdap) 05/23/2020   INFLUENZA VACCINE  Never done   COVID-19 Vaccine (1 - 2023-24 season) Never done      Objective:     BP 111/76   Pulse 66   Ht 5\' 2"  (1.575 m)   Wt 167 lb 6.4 oz (75.9 kg)   LMP 11/03/2006   SpO2 100%   BMI 30.62 kg/m    Physical Exam General: Alert, oriented Pulmonary: No respiratory stress Psych: Pleasant affect   No results found for any visits on 09/08/23.       Assessment & Plan:   Primary hypertension Assessment & Plan: Patient did not tolerate the switch from lisinopril to lisinopril-HCTZ.  She had increase in dizziness and weakness.  2 episodes of feeling clammy and lightheaded.  Associated with hypotension at the time. - We will go back to lisinopril 10 mg only - Continue checking home blood pressure readings - Follow-up in 1 month and at that time can discuss twice daily lisinopril or increasing dose slightly.  Would avoid increasing dose significantly or adding a new medication prior to adjusting lisinopril.   Muscle cramp Assessment & Plan: Did not tolerate magnesium oxide due to diarrhea.  Discussed Slow-Mag, topical formulations of magnesium, and trying co-Q10 in case statins are contributing       Return in about 4 weeks (around 10/06/2023) for HTN.    Sandre Kitty, MD

## 2023-09-14 ENCOUNTER — Other Ambulatory Visit: Payer: Self-pay | Admitting: Family Medicine

## 2023-09-14 ENCOUNTER — Encounter: Payer: Self-pay | Admitting: Family Medicine

## 2023-09-14 MED ORDER — LISINOPRIL 10 MG PO TABS: 10.0000 mg | ORAL_TABLET | Freq: Every day | ORAL | 3 refills | Status: DC

## 2023-10-07 ENCOUNTER — Ambulatory Visit: Payer: 59 | Admitting: Family Medicine

## 2023-10-13 ENCOUNTER — Encounter: Payer: Self-pay | Admitting: Family Medicine

## 2023-10-13 ENCOUNTER — Ambulatory Visit: Payer: 59 | Admitting: Family Medicine

## 2023-10-13 VITALS — BP 136/78 | HR 64 | Ht 62.0 in | Wt 169.0 lb

## 2023-10-13 DIAGNOSIS — I10 Essential (primary) hypertension: Secondary | ICD-10-CM

## 2023-10-13 DIAGNOSIS — Z23 Encounter for immunization: Secondary | ICD-10-CM | POA: Diagnosis not present

## 2023-10-13 DIAGNOSIS — E782 Mixed hyperlipidemia: Secondary | ICD-10-CM | POA: Diagnosis not present

## 2023-10-13 NOTE — Patient Instructions (Signed)
It was nice to see you today,  We addressed the following topics today: -Your blood pressure at home is at a reasonable level.  As long it is under 140/85 most of the time that would be okay. - After you lose some more weight and change some things in diet if you want to discuss coming off the cholesterol medication at your next visit we can discuss it at that time  Have a great day,  Frederic Jericho, MD

## 2023-10-13 NOTE — Progress Notes (Unsigned)
   Established Patient Office Visit  Subjective   Patient ID: Kathy Stephens, female    DOB: 09-Jul-1961  Age: 62 y.o. MRN: [id number]  No chief complaint on file.   HPI  Hypertension with hypotensive episodes.  Patient has not had any more hypotensive episodes.  Says she feels great on current dose of medication of lisinopril 10 mg.  She brought in some home readings that are mostly in the 130s systolic and less than 85 diastolic.  Highest 1 was 147 systolic.  Hyperlipidemia-patient had questions about whether she needs to be on cholesterol medication or not.  We discussed ACC and AHA guidelines for primary prevention.  She has also had increased risk factors with her former smoking status and her TIA.  She has had no evidence of atherosclerotic disease in the carotid arteries and her coronary artery calcium score in 2022 was 0.  We discussed weight loss, dietary changes, exercise.  Patient tries to get exercise and plans to continue going to [facility] for exercise classes twice a week.  Patient no longer having any pain in her legs when she walks.  We discussed co-Q10 for side effects of statins.  She is taking her Lipitor daily.  Patient not having any more significant issues with muscle cramping at night.  She does get some cramping in her arches and states that both of her brothers get this as well.  Not currently taking any magnesium supplementation.  Cramps usually last few minutes at most.   The 10-year ASCVD risk score (Arnett DK, et al., 2019) is: 6.8%  Health Maintenance Due  Topic Date Due   HIV Screening  Never done   Hepatitis C Screening  Never done   Zoster Vaccines- Shingrix (1 of 2) Never done   DTaP/Tdap/Td (2 - Td or Tdap) 05/23/2020   INFLUENZA VACCINE  Never done   COVID-19 Vaccine (1 - 2023-24 season) Never done      Objective:     LMP 11/03/2006  {Vitals History (Optional):23777}  Physical Exam General: Alert, oriented Pulmonary:  No respiratory distress Psych: Pleasant affect    No results found for any visits on 10/13/23.      Assessment & Plan:   There are no diagnoses linked to this encounter.   No follow-ups on file.    Sandre Kitty, MD

## 2023-10-14 NOTE — Assessment & Plan Note (Addendum)
Patient taking Lipitor but wants to know if it is necessary.  She has had a TIA in the past.  Recent CTA head and neck showed carotid arteries were patent with no atherosclerotic disease.  Her coronary artery calcium score from 2022 was 0.  Discussed  ACC guidelines for statin use.  Can do further workup in the future including LPA, lipoprotein B if she would like.  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  No intervention was recommended at the time.  It appears she has had cardiac event monitoring that was normal and an echo that was normal as well.  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  In that sense, she would still benefit from a statin unless she is intolerant of them but at this time her leg pain initially believed to possibly be statin related has resolved.

## 2023-10-14 NOTE — Assessment & Plan Note (Signed)
Patient doing well on 10 mg lisinopril.  No more dizziness episodes.  Home values are generally in the 130s systolic and less than 85 diastolic.  Patient does not appear to be tolerant of lower blood pressure goals so we will continue with goal of less than 140/85.

## 2024-02-11 IMAGING — CR DG HIP (WITH OR WITHOUT PELVIS) 2-3V*L*
3 series · 3 of 3 positions shown · non-contrast
Comparison: CT scan of March 14, 2022.

CLINICAL DATA: Chronic left hip pain.

EXAM:
DG HIP (WITH OR WITHOUT PELVIS) 2-3V LEFT

[w pelvis upright]
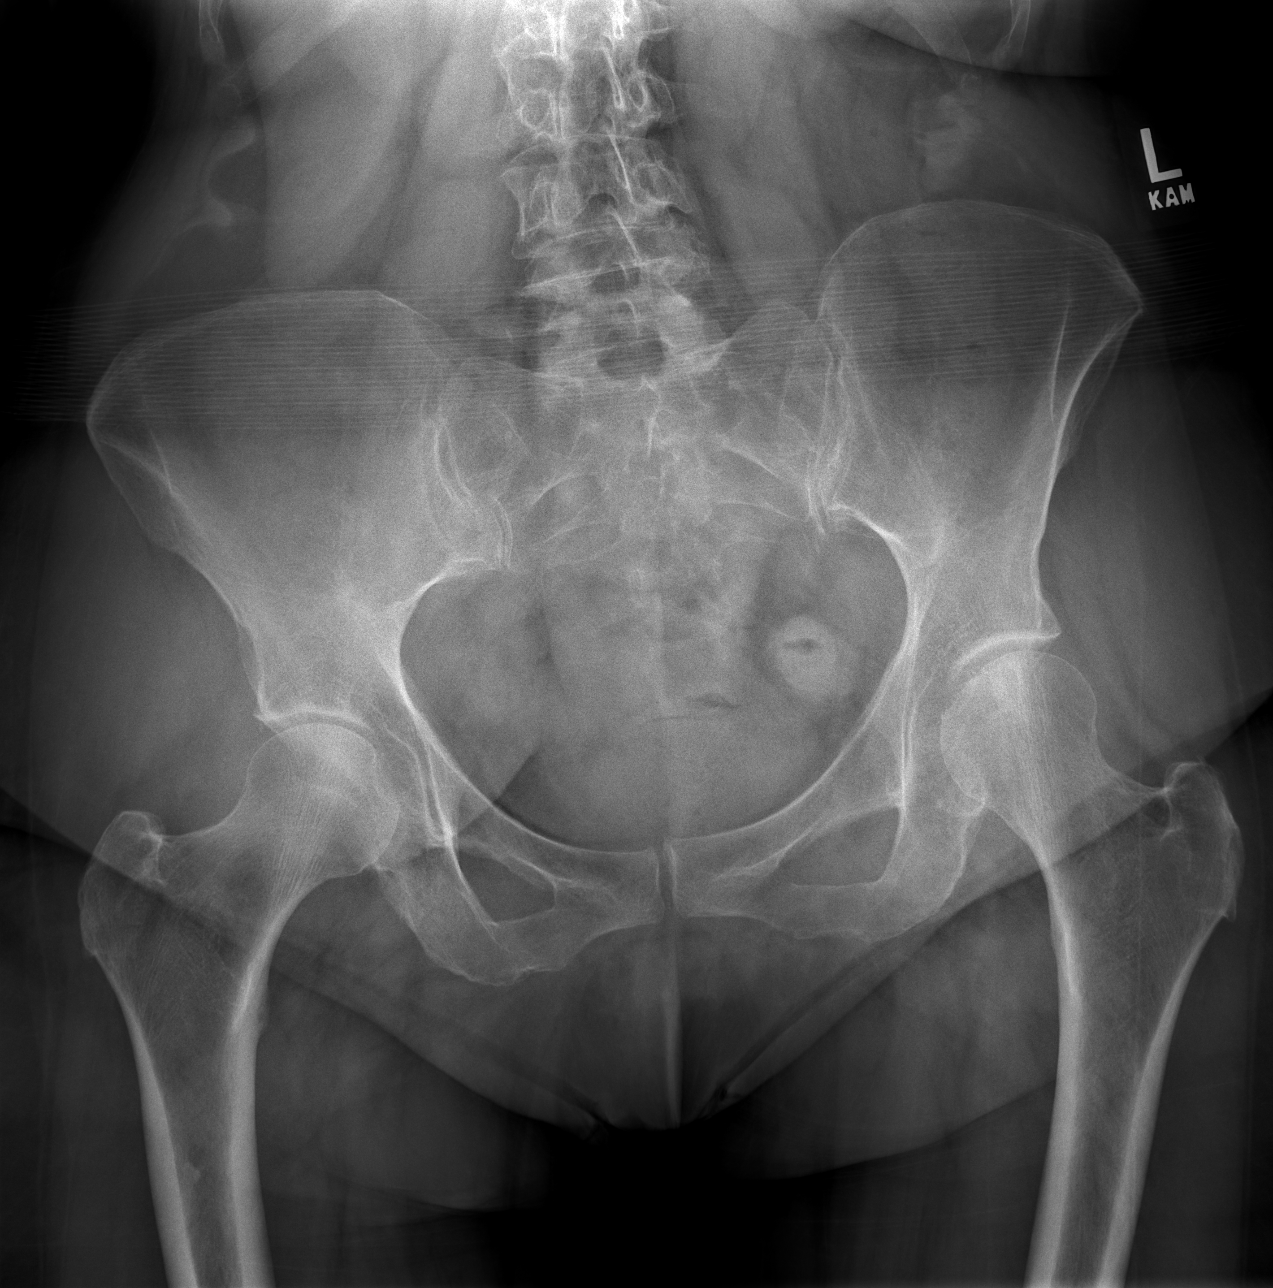

[w hip ap left]
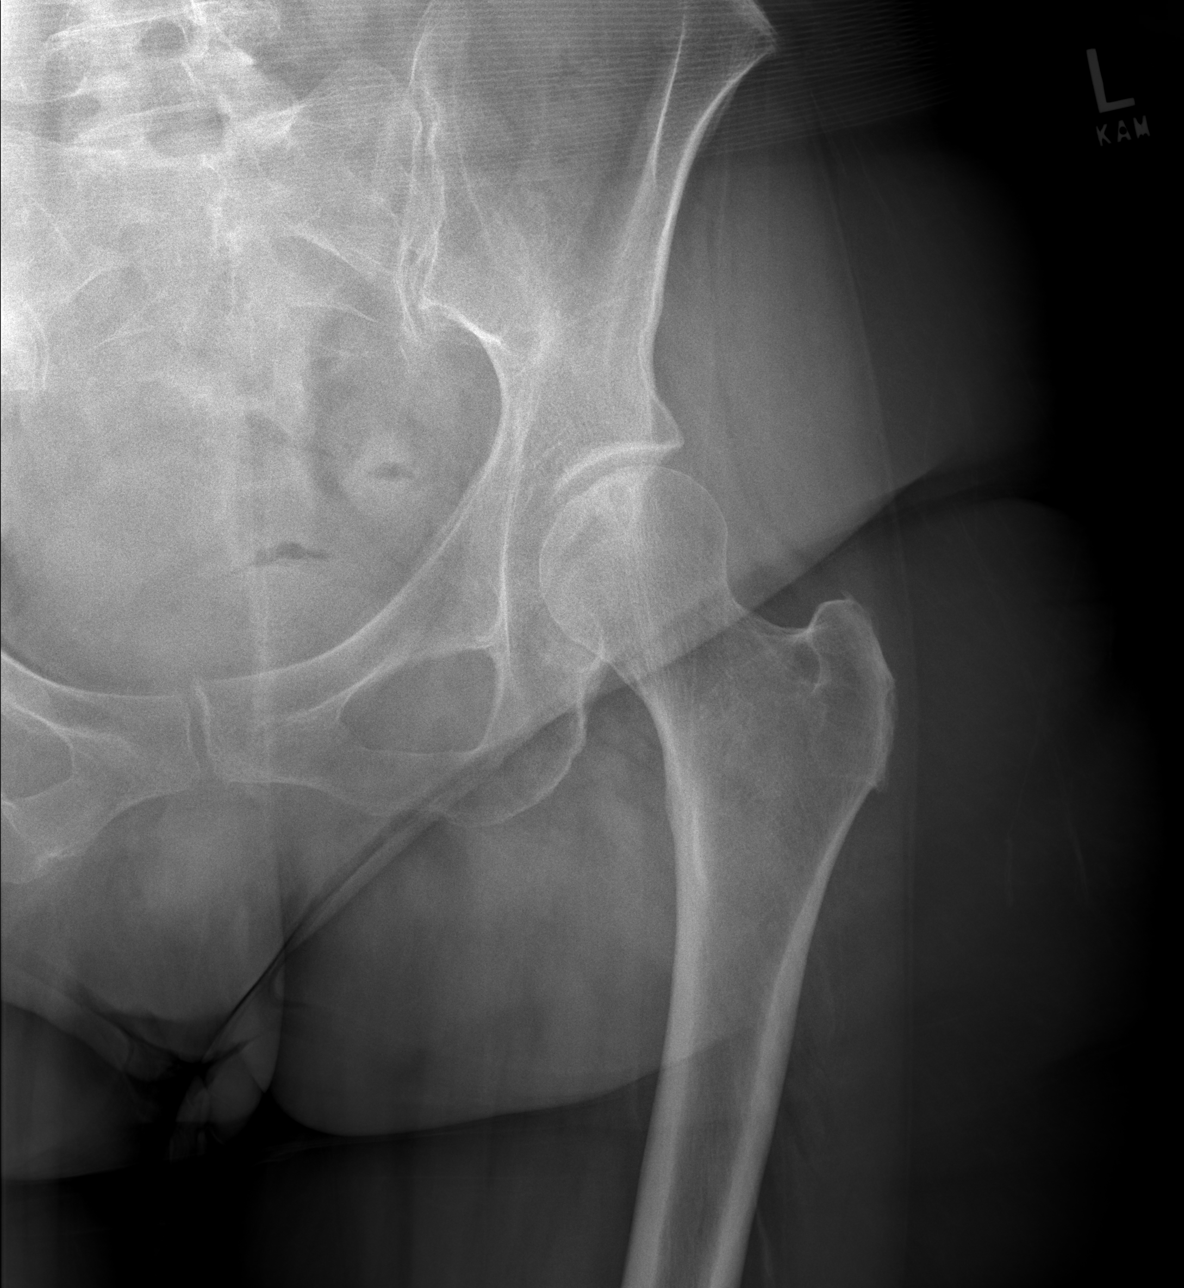

[w hip lat left]
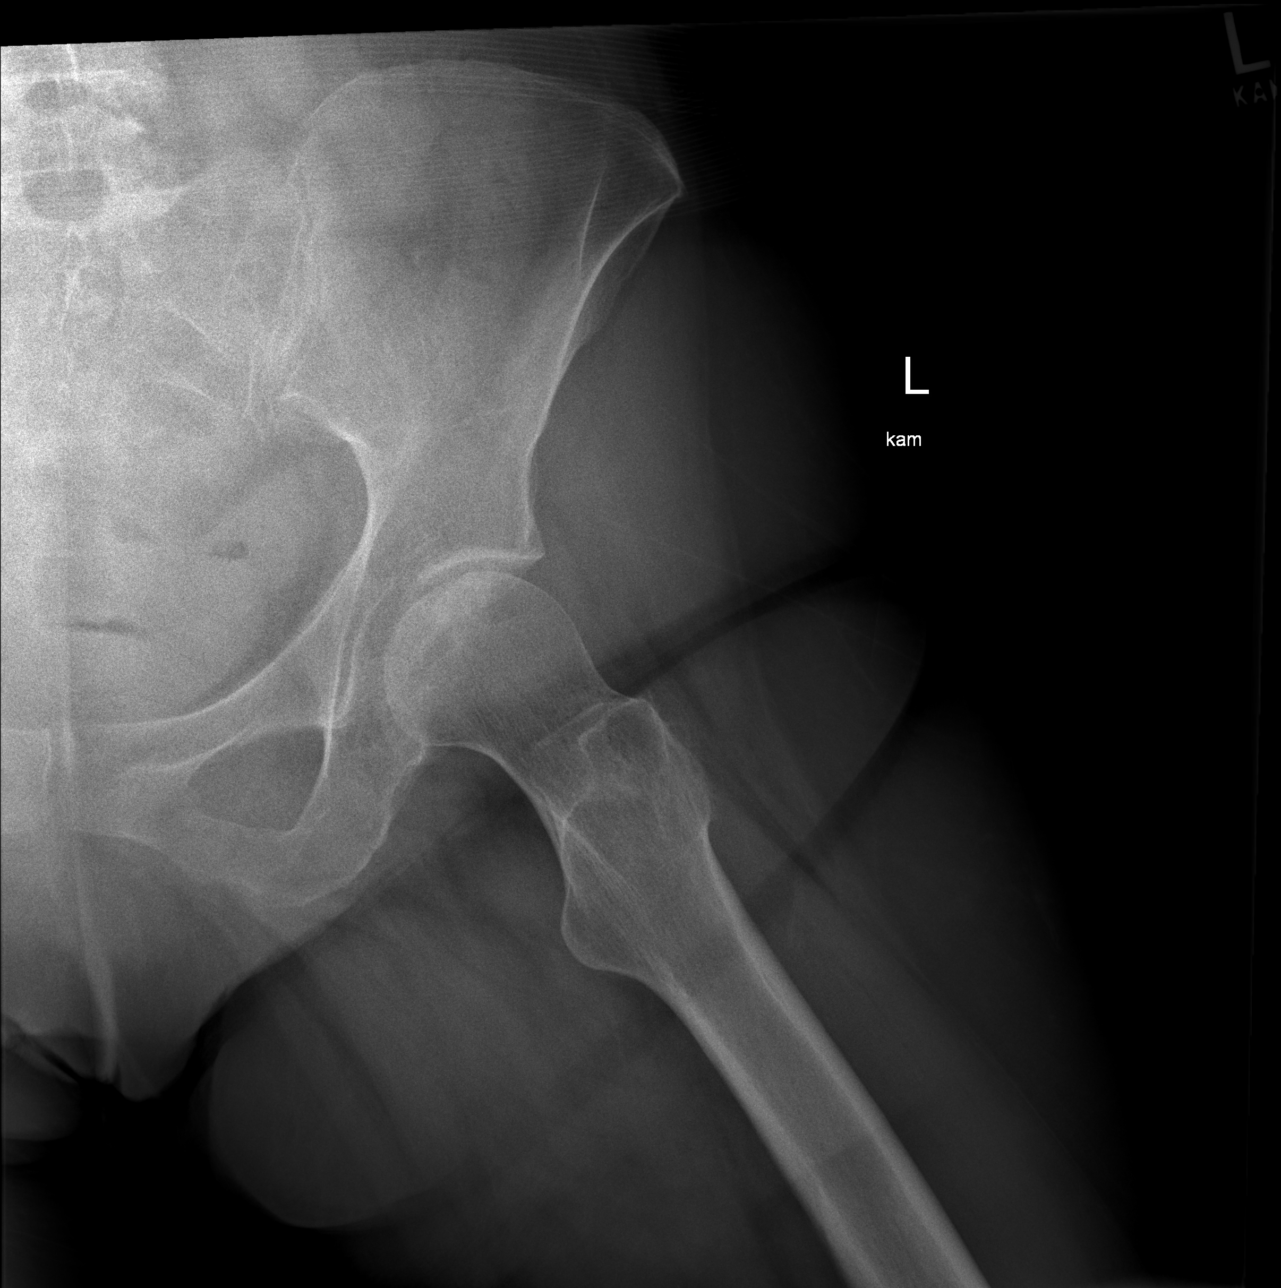

[3 of 3 positions shown; findings below may reference images not displayed]

FINDINGS: There is no evidence of hip fracture or dislocation. There is noted
sclerosis involving the superior portion of the left femoral head
consistent with avascular necrosis as noted on recent CT scan. No
significant joint space narrowing is noted.
IMPRESSION: Focal sclerosis is seen involving superior portion of left femoral
head consistent with avascular necrosis as noted on recent CT scan.

## 2024-03-04 DIAGNOSIS — R200 Anesthesia of skin: Secondary | ICD-10-CM

## 2024-03-04 HISTORY — DX: Anesthesia of skin: R20.0

## 2024-03-14 ENCOUNTER — Telehealth: Payer: Self-pay

## 2024-03-14 NOTE — Telephone Encounter (Signed)
 Pt called with c/o LLE calf pain and claudication for about 1.5 weeks, since returning from vacation. The pain starts in the calf and when she walks it goes down to her foot which then feels numb. She denies any swelling, redness, or warmth. She has been advised to contact her PCP asap and to call us  if she needs further assistance. Pt verbalized understanding and was in agreement.

## 2024-03-21 ENCOUNTER — Ambulatory Visit: Admitting: Family Medicine

## 2024-03-21 ENCOUNTER — Encounter: Payer: Self-pay | Admitting: Family Medicine

## 2024-03-21 VITALS — BP 116/73 | HR 64 | Ht 62.0 in | Wt 171.0 lb

## 2024-03-21 DIAGNOSIS — M79662 Pain in left lower leg: Secondary | ICD-10-CM | POA: Insufficient documentation

## 2024-03-21 NOTE — Assessment & Plan Note (Signed)
 Pain in left calf with ambulation, improved with rest. Exam normal. Pt had ABIs within past few years that did not show PAD, so claudication less likely.  Calf Muscle strain possible but exam not consistent with muscle strain.  Possibly chronic compartment syndrome? Getting d-dimer to r/o dvt but there is low suspicion for this given her normal exam. If positive will get LE dvt u/s.

## 2024-03-21 NOTE — Progress Notes (Signed)
   Acute Office Visit  Subjective:     Patient ID: Kathy Stephens, female    DOB: [birth year], 62 y.o.   MRN: [id number]  Chief Complaint  Patient presents with   Calf pain    HPI  ROS  Subjective - Left calf pain with walking, started on second day of [place] cruise - Pain described as tightness that increases with walking, feels like a "charlie horse" that doesn't resolve until sitting down - Pain radiates down to foot with tingling/numbness in toes that resolves when pressure taken off leg - No swelling, no heat, no tenderness to palpation - Pain occurs even with short walks (2-3 houses down) - Describes calf feeling "like a stone" - hard and heavy - Hip pain started concurrently on cruise - History of avascular 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 Rossie Stephens (orthopedics) who recommended delaying hip replacement - No prior similar calf pain - Traveled to [place] in 2023 without any walking difficulties - Current cruise trip preceded by 12-hour car ride (normally 8 hours)  PMH: Avascular necrosis of left hip, history of partial splenic infarction, hypertension.  Social Hx: Kathy Stephens frequently, has been on 19 cruises.  ROS: Musculoskeletal: Left calf pain with walking, left hip pain. Neurological: Tingling in toes with walking that resolves with rest.     Objective:    BP 116/73   Pulse 64   Ht 5\' 2"  (1.575 m)   Wt 171 lb (77.6 kg)   LMP 11/03/2006   SpO2 99%   BMI 31.28 kg/m    Physical Exam Gen: alert, oriented Pulm: no resp distress Ext: no swelling in either leg; no TTP.  No deformity or mass noted.   No results found for any visits on 03/21/24.      Assessment & Plan:   Pain of left calf Assessment & Plan: Pain in left calf with ambulation, improved with rest. Exam normal. Pt had ABIs within past few years that did not show PAD, so claudication less likely.  Calf Muscle strain possible but exam not consistent with muscle  strain.  Possibly chronic compartment syndrome? Getting d-dimer to r/o dvt but there is low suspicion for this given her normal exam. If positive will get LE dvt u/s.    Orders: -     D-dimer, quantitative; Future     Return for already has appt.  Kathy Pintos, MD

## 2024-03-21 NOTE — Patient Instructions (Signed)
 It was nice to see you today,  We addressed the following topics today: -I would like to get a D-dimer test to rule out DVT.  If it is positive we will get an ultrasound. - Depending on the results of the D-dimer I may order additional testing.  We would let you know once we get the results. - If you are developing any shortness of breath with this you should have someone take you to the emergency department.  If your pain worsens significantly you should see the orthopedist sooner than your current appointment in June.  Have a great day,  Etha Henle, MD

## 2024-03-23 ENCOUNTER — Ambulatory Visit: Payer: Self-pay | Admitting: Family Medicine

## 2024-03-23 DIAGNOSIS — R7989 Other specified abnormal findings of blood chemistry: Secondary | ICD-10-CM

## 2024-03-23 LAB — D-DIMER, QUANTITATIVE: D-DIMER: 0.73 mg{FEU}/L — ABNORMAL HIGH (ref 0.00–0.49)

## 2024-03-24 ENCOUNTER — Ambulatory Visit
Admission: RE | Admit: 2024-03-24 | Discharge: 2024-03-24 | Disposition: A | Discharge status: Home / Self Care | Source: Ambulatory Visit | Attending: Family Medicine | Admitting: Family Medicine

## 2024-03-24 DIAGNOSIS — R7989 Other specified abnormal findings of blood chemistry: Secondary | ICD-10-CM

## 2024-03-25 ENCOUNTER — Ambulatory Visit: Payer: Self-pay | Admitting: Family Medicine

## 2024-04-14 ENCOUNTER — Ambulatory Visit: Payer: 59 | Admitting: Family Medicine

## 2024-04-27 ENCOUNTER — Ambulatory Visit: Payer: Self-pay

## 2024-04-27 NOTE — Telephone Encounter (Signed)
 Copied from CRM [id number]. Topic: Clinical - Red Word Triage >> Apr 27, 2024 11:02 AM Kathy Stephens wrote: Red Word that prompted transfer to Nurse Triage: Pt states she is on BP meds and her bp usually runs in the 120s. She said yesterday she had a spike where it went up to 180/101 and she was nauseous, had pain and pressure in back and dizziness. She states it is in the 140s today    FYI Only or Action Required?: Action required by provider: medication refill request.  Patient was last seen in primary care on 03/21/2024 by Chandra Toribio POUR, MD. Called Nurse Triage reporting Hypertension. Symptoms began yesterday. Interventions attempted: OTC medications: Took 325 of aspirin . Symptoms are: completely resolved but BP is still elevated.  Triage Disposition: See PCP Within 2 Weeks  Patient/caregiver understands and will follow disposition?: No, wishes to speak with PCP     Patient would like to know if Dr. Chandra would want to make any adjustments to her BP medications. No appointments available until August. Please advise.      Reason for Disposition  [1] Systolic BP  >= 130 OR Diastolic >= 80 AND [2] taking BP medications  Answer Assessment - Initial Assessment Questions 1. BLOOD PRESSURE: What is the blood pressure? Did you take at least two measurements 5 minutes apart?     180/101 yesterday, 145/92 today  2. ONSET: When did you take your blood pressure?     Yesterday  3. HOW: How did you take your blood pressure? (e.g., automatic home BP monitor, visiting nurse)     Automatic BP cuff 4. HISTORY: Do you have a history of high blood pressure?     Yes 5. MEDICINES: Are you taking any medicines for blood pressure? Have you missed any doses recently?     Has not missed any doses  6. OTHER SYMPTOMS: Do you have any symptoms? (e.g., blurred vision, chest pain, difficulty breathing, headache, weakness)     Nausea, headache, pressure in upper back/neck, dizziness, symptoms now  resolved  Protocols used: Blood Pressure - High-A-AH

## 2024-05-01 ENCOUNTER — Inpatient Hospital Stay (HOSPITAL_COMMUNITY)
Admission: EM | Admit: 2024-05-01 | Discharge: 2024-05-03 | DRG: 322 | Disposition: A | Discharge status: Home / Self Care | Attending: Internal Medicine | Admitting: Internal Medicine

## 2024-05-01 ENCOUNTER — Encounter (HOSPITAL_COMMUNITY): Payer: Self-pay | Admitting: *Deleted

## 2024-05-01 ENCOUNTER — Emergency Department (HOSPITAL_COMMUNITY)

## 2024-05-01 ENCOUNTER — Other Ambulatory Visit: Payer: Self-pay

## 2024-05-01 DIAGNOSIS — G8929 Other chronic pain: Secondary | ICD-10-CM | POA: Diagnosis present

## 2024-05-01 DIAGNOSIS — E1151 Type 2 diabetes mellitus with diabetic peripheral angiopathy without gangrene: Secondary | ICD-10-CM | POA: Diagnosis present

## 2024-05-01 DIAGNOSIS — K219 Gastro-esophageal reflux disease without esophagitis: Secondary | ICD-10-CM | POA: Diagnosis present

## 2024-05-01 DIAGNOSIS — Z803 Family history of malignant neoplasm of breast: Secondary | ICD-10-CM | POA: Diagnosis not present

## 2024-05-01 DIAGNOSIS — F17290 Nicotine dependence, other tobacco product, uncomplicated: Secondary | ICD-10-CM | POA: Diagnosis present

## 2024-05-01 DIAGNOSIS — Z90721 Acquired absence of ovaries, unilateral: Secondary | ICD-10-CM

## 2024-05-01 DIAGNOSIS — Z800 Family history of malignant neoplasm of digestive organs: Secondary | ICD-10-CM | POA: Diagnosis not present

## 2024-05-01 DIAGNOSIS — Z7902 Long term (current) use of antithrombotics/antiplatelets: Secondary | ICD-10-CM | POA: Diagnosis not present

## 2024-05-01 DIAGNOSIS — I10 Essential (primary) hypertension: Secondary | ICD-10-CM | POA: Diagnosis present

## 2024-05-01 DIAGNOSIS — Z955 Presence of coronary angioplasty implant and graft: Secondary | ICD-10-CM

## 2024-05-01 DIAGNOSIS — E669 Obesity, unspecified: Secondary | ICD-10-CM | POA: Diagnosis present

## 2024-05-01 DIAGNOSIS — E785 Hyperlipidemia, unspecified: Secondary | ICD-10-CM | POA: Diagnosis present

## 2024-05-01 DIAGNOSIS — Z823 Family history of stroke: Secondary | ICD-10-CM | POA: Diagnosis not present

## 2024-05-01 DIAGNOSIS — M797 Fibromyalgia: Secondary | ICD-10-CM | POA: Diagnosis present

## 2024-05-01 DIAGNOSIS — Z8673 Personal history of transient ischemic attack (TIA), and cerebral infarction without residual deficits: Secondary | ICD-10-CM | POA: Diagnosis not present

## 2024-05-01 DIAGNOSIS — I2510 Atherosclerotic heart disease of native coronary artery without angina pectoris: Secondary | ICD-10-CM | POA: Diagnosis present

## 2024-05-01 DIAGNOSIS — Z716 Tobacco abuse counseling: Secondary | ICD-10-CM

## 2024-05-01 DIAGNOSIS — Z951 Presence of aortocoronary bypass graft: Secondary | ICD-10-CM | POA: Diagnosis not present

## 2024-05-01 DIAGNOSIS — Z79899 Other long term (current) drug therapy: Secondary | ICD-10-CM

## 2024-05-01 DIAGNOSIS — Z7982 Long term (current) use of aspirin: Secondary | ICD-10-CM

## 2024-05-01 DIAGNOSIS — Z8249 Family history of ischemic heart disease and other diseases of the circulatory system: Secondary | ICD-10-CM

## 2024-05-01 DIAGNOSIS — Z83719 Family history of colon polyps, unspecified: Secondary | ICD-10-CM | POA: Diagnosis not present

## 2024-05-01 DIAGNOSIS — Z881 Allergy status to other antibiotic agents status: Secondary | ICD-10-CM

## 2024-05-01 DIAGNOSIS — Z6831 Body mass index (BMI) 31.0-31.9, adult: Secondary | ICD-10-CM

## 2024-05-01 DIAGNOSIS — I214 Non-ST elevation (NSTEMI) myocardial infarction: Principal | ICD-10-CM | POA: Diagnosis present

## 2024-05-01 DIAGNOSIS — Z83438 Family history of other disorder of lipoprotein metabolism and other lipidemia: Secondary | ICD-10-CM | POA: Diagnosis not present

## 2024-05-01 DIAGNOSIS — R079 Chest pain, unspecified: Principal | ICD-10-CM

## 2024-05-01 LAB — CBC
HCT: 41.5 % (ref 36.0–46.0)
HCT: 39.1 % (ref 36.0–46.0)
Hemoglobin: 13.5 g/dL (ref 12.0–15.0)
Hemoglobin: 12.6 g/dL (ref 12.0–15.0)
MCH: 30.2 pg (ref 26.0–34.0)
MCH: 30.4 pg (ref 26.0–34.0)
MCHC: 32.5 g/dL (ref 30.0–36.0)
MCHC: 32.2 g/dL (ref 30.0–36.0)
MCV: 92.8 fL (ref 80.0–100.0)
MCV: 94.4 fL (ref 80.0–100.0)
Platelets: 169 10*3/uL (ref 150–400)
Platelets: 164 10*3/uL (ref 150–400)
RBC: 4.47 MIL/uL (ref 3.87–5.11)
RBC: 4.14 MIL/uL (ref 3.87–5.11)
RDW: 12.5 % (ref 11.5–15.5)
RDW: 12.6 % (ref 11.5–15.5)
WBC: 7.7 10*3/uL (ref 4.0–10.5)
WBC: 6.3 10*3/uL (ref 4.0–10.5)
nRBC: 0 % (ref 0.0–0.2)
nRBC: 0 % (ref 0.0–0.2)

## 2024-05-01 LAB — BASIC METABOLIC PANEL WITH GFR
Anion gap: 9 (ref 5–15)
BUN: 17 mg/dL (ref 8–23)
CO2: 27 mmol/L (ref 22–32)
Calcium: 9.5 mg/dL (ref 8.9–10.3)
Chloride: 103 mmol/L (ref 98–111)
Creatinine, Ser: 0.93 mg/dL (ref 0.44–1.00)
GFR, Estimated: >60 mL/min (ref >60)
Glucose, Bld: 112 mg/dL — ABNORMAL HIGH (ref 70–99)
Potassium: 4.4 mmol/L (ref 3.5–5.1)
Sodium: 139 mmol/L (ref 135–145)

## 2024-05-01 LAB — HIV ANTIBODY (ROUTINE TESTING W REFLEX): HIV Screen 4th Generation wRfx: NONREACTIVE

## 2024-05-01 LAB — CREATININE, SERUM
Creatinine, Ser: 1.06 mg/dL — ABNORMAL HIGH (ref 0.44–1.00)
GFR, Estimated: 60 mL/min — ABNORMAL LOW (ref >60)

## 2024-05-01 LAB — HEPARIN LEVEL (UNFRACTIONATED): Heparin Unfractionated: 0.43 [IU]/mL (ref 0.30–0.70)

## 2024-05-01 LAB — TROPONIN I (HIGH SENSITIVITY)
Troponin I (High Sensitivity): 201 ng/L (ref <18)
Troponin I (High Sensitivity): 224 ng/L (ref <18)
Troponin I (High Sensitivity): 360 ng/L (ref <18)

## 2024-05-01 MED ORDER — ISOSORBIDE MONONITRATE ER 30 MG PO TB24
30.0000 mg | ORAL_TABLET | Freq: Every day | ORAL | Status: DC
  Administered 2024-05-01: 30 mg via ORAL
  Filled 2024-05-01 (×2): qty 1

## 2024-05-01 MED ORDER — NITROGLYCERIN 0.4 MG SL SUBL: 0.4000 mg | SUBLINGUAL_TABLET | SUBLINGUAL | Status: DC | PRN

## 2024-05-01 MED ORDER — LISINOPRIL 10 MG PO TABS
10.0000 mg | ORAL_TABLET | Freq: Every day | ORAL | Status: DC
  Administered 2024-05-01 – 2024-05-03 (×3): 10 mg via ORAL
  Filled 2024-05-01 (×3): qty 1

## 2024-05-01 MED ORDER — ACETAMINOPHEN 325 MG PO TABS
650.0000 mg | ORAL_TABLET | Freq: Four times a day (QID) | ORAL | Status: DC | PRN
  Administered 2024-05-01 – 2024-05-02 (×2): 650 mg via ORAL
  Filled 2024-05-01 (×2): qty 2

## 2024-05-01 MED ORDER — ASPIRIN 81 MG PO CHEW
81.0000 mg | CHEWABLE_TABLET | ORAL | Status: AC
  Administered 2024-05-02: 81 mg via ORAL
  Filled 2024-05-01: qty 1

## 2024-05-01 MED ORDER — ASPIRIN 81 MG PO CHEW
324.0000 mg | CHEWABLE_TABLET | Freq: Once | ORAL | Status: AC
  Administered 2024-05-01: 324 mg via ORAL
  Filled 2024-05-01: qty 4

## 2024-05-01 MED ORDER — HEPARIN (PORCINE) 25000 UT/250ML-% IV SOLN
1000.0000 [IU]/h | INTRAVENOUS | Status: DC
  Administered 2024-05-01: 1000 [IU]/h via INTRAVENOUS
  Filled 2024-05-01 (×2): qty 250

## 2024-05-01 MED ORDER — NICOTINE 14 MG/24HR TD PT24
14.0000 mg | MEDICATED_PATCH | Freq: Every day | TRANSDERMAL | Status: DC
  Filled 2024-05-01 (×3): qty 1

## 2024-05-01 MED ORDER — ATORVASTATIN CALCIUM 80 MG PO TABS
80.0000 mg | ORAL_TABLET | Freq: Every day | ORAL | Status: DC
  Filled 2024-05-01: qty 1

## 2024-05-01 MED ORDER — HEPARIN BOLUS VIA INFUSION
4000.0000 [IU] | Freq: Once | INTRAVENOUS | Status: AC
  Administered 2024-05-01: 4000 [IU] via INTRAVENOUS
  Filled 2024-05-01: qty 4000

## 2024-05-01 MED ORDER — SODIUM CHLORIDE 0.9 % WEIGHT BASED INFUSION: 1.0000 mL/kg/h | INTRAVENOUS | Status: DC

## 2024-05-01 MED ORDER — ASPIRIN 81 MG PO TBEC
81.0000 mg | DELAYED_RELEASE_TABLET | Freq: Every day | ORAL | Status: DC
  Administered 2024-05-03: 81 mg via ORAL
  Filled 2024-05-01 (×2): qty 1

## 2024-05-01 MED ORDER — FAMOTIDINE 20 MG PO TABS
20.0000 mg | ORAL_TABLET | Freq: Two times a day (BID) | ORAL | Status: DC
  Administered 2024-05-01 – 2024-05-03 (×5): 20 mg via ORAL
  Filled 2024-05-01 (×5): qty 1

## 2024-05-01 MED ORDER — SODIUM CHLORIDE 0.9 % WEIGHT BASED INFUSION
3.0000 mL/kg/h | INTRAVENOUS | Status: DC
  Administered 2024-05-02: 3 mL/kg/h via INTRAVENOUS

## 2024-05-01 NOTE — Progress Notes (Signed)
 Pt had episode of chest tightness. BP 188/85 at this time. EKG done(see chart). Tightness resolved by the time I completed EKG. O2 applied, has IV heparin infusing and for cardiac cath in am. Provider on call notified of the above via amion.

## 2024-05-01 NOTE — Progress Notes (Incomplete)
 Attending Note   Patient seen and discussed with PA Darryle, I agree with his documentation. 63 yo female history of ulcerative colitis, depression, fibromyalgia, HTN, HLD, prior splenic infarction, prior TIA, presents with chest pain. Multiple recent episodes left chest radiating into neck associated with nausea, diaphoresis. Recent fatigue and SOB/DOE with activities.   K 4.4 BUN 17 Cr 0.93 WBC 7.7 Hgb 13.5 Plt 169  Trop 201-->224 EKG SR, subtle inferior ST depressions though chronic appearing, V1/V2 TWIs CXR: no acute process  Echo pending   04/2022 echo: LVEF 60-65%, normal diastolic, normal RV function 04/2021 coronary calcium : calcium  score of 0    1.NSTEMI - multiple CAD risk factors with strong family history, HTN, HLD, tobacco use.  - presents with chest pain, trop up to 224 without clear peak. EKG anterior TWIs.  - medical therapy with ASA 81, atorva 80, hep gtt, lisinopril  10mg . Low normal HRs, would avoid beta blocker - echo pending - would plan for cath Monday  Dorn Ross MD

## 2024-05-01 NOTE — Progress Notes (Signed)
 PHARMACY - ANTICOAGULATION CONSULT NOTE  Pharmacy Consult for Heparin Indication: chest pain/ACS  Allergies  Allergen Reactions   Other Other (See Comments)    Nut Meg: Throat Swelling/Burning    Azithromycin Rash and Other (See Comments)    Tingly in face and lips    Patient Measurements: Height: 5' 2 (157.5 cm) Weight: 77.6 kg (171 lb 1.2 oz) IBW/kg (Calculated) : 50.1 HEPARIN DW (KG): 67.1  Vital Signs: Temp: 97.7 F (36.5 C) (06/29 1412) Temp Source: Oral (06/29 1412) BP: 126/68 (06/29 1412) Pulse Rate: 62 (06/29 1412)  Labs: Recent Labs    05/01/24 0129 05/01/24 0406 05/01/24 1117 05/01/24 1351  HGB 13.5  --   --  12.6  HCT 41.5  --   --  39.1  PLT 169  --   --  164  HEPARINUNFRC  --   --   --  0.43  CREATININE 0.93  --   --   --   TROPONINIHS 201* 224* 360*  --     Estimated Creatinine Clearance: 61.3 mL/min (by C-G formula based on SCr of 0.93 mg/dL).   Medical History: Past Medical History:  Diagnosis Date   Allergy    Anemia    Anxiety    Cancer (HCC)    skin (nose & face)   Depression    Fibromyalgia 04/09/2021   GERD (gastroesophageal reflux disease)    Inflammatory bowel disease (ulcerative colitis) (HCC)    Migraines    while on Liada (mesalazine), not on it at this time   Osteopenia     Medications:  No current facility-administered medications on file prior to encounter.   Current Outpatient Medications on File Prior to Encounter  Medication Sig Dispense Refill   acetaminophen  (TYLENOL ) 500 MG tablet Take 500-1,000 mg by mouth every 6 (six) hours as needed for moderate pain (pain score 4-6).     aspirin  EC 81 MG tablet Take 1 tablet (81 mg total) by mouth daily. Swallow whole. 30 tablet 12   diphenhydrAMINE (BENADRYL) 25 mg capsule Take 25 mg by mouth at bedtime.     famotidine (PEPCID) 20 MG tablet Take 20 mg by mouth daily.     lisinopril  (ZESTRIL ) 10 MG tablet Take 1 tablet (10 mg total) by mouth daily. 90 tablet 3    loperamide (IMODIUM A-D) 2 MG tablet Take 2 mg by mouth daily.     [DISCONTINUED] cycloSPORINE (RESTASIS) 0.05 % ophthalmic emulsion 1 drop 2 (two) times daily.     [DISCONTINUED] Emollient (COLLAGEN EX) Take 20 mg by mouth daily.       Assessment: 63 y.o. female with chest pain, she is not on anticoagulation PTA, cards plan for cath Monday, initial heparin level therapeutic on 1000 units/hr  Goal of Therapy:  Heparin level 0.3-0.7 units/ml Monitor platelets by anticoagulation protocol: Yes   Plan:  Continue heparin gtt at 1000 units/hr Daily heparin level, CBC, s/s bleeding F/u post-cath Monday  Dorn Poot, PharmD, [facility] Clinical Pharmacist ED Pharmacist Phone # [phone] 05/01/2024 2:49 PM

## 2024-05-01 NOTE — Hospital Course (Signed)
 63 year old female past medical history of essential hypertension, hyperlipidemia, smoking cigarette, and vitamin D  deficiency presents emergency department complaining of chest pain for last 2 to 3 days and elevated blood pressure. At presentation to ED patient found hypertensive blood pressure 176/89 otherwise hemodynamically stable. Elevated troponin 201 and pending second troponin level. CBC unremarkable. BMP unremarkable. EKG showed normal sinus rhythm, T wave inversion in lead aVR, aVL, V1, V2, V4 and V6.  In the ED patient has been given aspirin  load 324 mg and sublingual nitroglycerin . Chest x-ray no active disease process.  Patient initially came to ED however in the long wait time in the ED she left.  While at home she developed another episode of chest pain during sleeping and came to ED immediately for evaluation.  Dr. Jerral consulted  cardiology Dr. Donnel who recommended if second troponin is high with positive delta change need to start IV heparin drip cardiology will see patient soon  Recommended ED physician Dr. Countryman to start IV heparin drip now rather than wait for second Trop level and will get echocardiogram.   Hospitalist has been consulted for further evaluation management of NSTMI and elevated blood pressure.

## 2024-05-01 NOTE — ED Notes (Signed)
 CCMD called, pt on monitor

## 2024-05-01 NOTE — ED Triage Notes (Incomplete)
 Lab has called a tro of 201 acuity increased charge  notified

## 2024-05-01 NOTE — Consult Note (Addendum)
 Cardiology Consultation   Patient ID: [name] MRN: [id number]; DOB: [birth year]  Admit date: 05/01/2024 Date of Consult: 05/01/2024  PCP:  Chandra Toribio POUR, MD   [facility] Providers Cardiologist:  Newman JINNY Lawrence, MD    Patient Profile: Kathy Stephens is a 63 y.o. female with a hx of hypertension, hyperlipidemia, nicotine dependence 20+ pack year history, multiple TIAs 2024, splenic infarct, who is being seen 05/01/2024 for the evaluation of NSTEMI at the request of Dr. Georgina.  History of Present Illness: Kathy Stephens has no significant past cardiac history.  She was seen in 2022 by cards for chest pain, had stress test that was normal, calcium  score of 0.  Of note she does have family history of MIs in both of her parents.  Not a known diabetic, history of frequent TIAs last year.  No drugs, alcohol.  Is a smoker.  Currently patient being evaluated for NSTEMI.  Troponin 201-224.  Started on IV heparin and given aspirin  load.  EKG showing new biphasic T wave in V2.  No ST elevation.  Not currently having chest pain.  Noted to be hypertensive on arrival with systolics in the 200s but now BP has normalized in the 120s.    Patient reports that since seen Dr. Lawrence in 2022 she has not had any chest pain however within the past week she has had multiple episodes of chest pain that have woken her up from her sleep, reports radiation into her neck, nausea, some sweatiness.  She had an episode 2 days ago that was more severe and much more persistent.  Also reports decrease in functional status and noted to be more fatigued and short of breath with mild activity such as walking her dog and which she did not experience before.  Otherwise does not complain of any chest pain right now.  Denies any shortness of breath now, peripheral edema, orthopnea, palpitations.  Initially reports intermittent spikes of elevated blood pressures during these episodes of chest pain but  traditionally has very well-controlled blood pressure off lisinopril  and in the 120s.  Chest x-ray negative.  Potassium 4.4.  Creatinine 0.93.  Hemoglobin 13.5.  Past Medical History:  Diagnosis Date   Allergy    Anemia    Anxiety    Cancer (HCC)    skin (nose & face)   Depression    Fibromyalgia 04/09/2021   GERD (gastroesophageal reflux disease)    Inflammatory bowel disease (ulcerative colitis) (HCC)    Migraines    while on Liada (mesalazine), not on it at this time   Osteopenia     Past Surgical History:  Procedure Laterality Date   CESAREAN SECTION     CHOLECYSTECTOMY  03/19/2012   Procedure: LAPAROSCOPIC CHOLECYSTECTOMY;  Surgeon: Lynwood MALVA Pina, MD;  Location: [facility] OR;  Service: General;  Laterality: N/A;   COLONOSCOPY WITH PROPOFOL  N/A 12/16/2022   Procedure: COLONOSCOPY WITH BIOPSY;  Surgeon: Unk Corinn Skiff, MD;  Location: [facility];  Service: Endoscopy;  Laterality: N/A;   FOOT SURGERY  1980's   LAPAROSCOPY     with cystectomy   LEFT OOPHORECTOMY     tendon relief     TUBAL LIGATION       Scheduled Meds:  [START ON 05/02/2024] aspirin  EC  81 mg Oral Daily   famotidine  20 mg Oral BID   lisinopril   10 mg Oral Daily   nicotine  14 mg Transdermal Daily   Continuous Infusions:  heparin 1,000 Units/hr (  05/01/24 0709)   PRN Meds: nitroGLYCERIN   Allergies:    Allergies  Allergen Reactions   Other Other (See Comments)    Nut Meg: Throat Swelling/Burning    Azithromycin Rash and Other (See Comments)    Tingly in face and lips    Social History:   Social History   Socioeconomic History   Marital status: Married    Spouse name: Not on file   Number of children: 1   Years of education: Not on file   Highest education level: 12th grade  Occupational History   Not on file  Tobacco Use   Smoking status: Every Day    Types: Cigars    Passive exposure: Current   Smokeless tobacco: Never   Tobacco comments:    5 daily  Vaping Use   Vaping  status: Never Used  Substance and Sexual Activity   Alcohol use: Yes    Alcohol/week: 0.0 - 1.0 standard drinks of alcohol    Comment: socially   Drug use: No   Sexual activity: Yes    Partners: Male    Birth control/protection: Surgical    Comment: BTL  Other Topics Concern   Not on file  Social History Narrative   Not on file   Social Drivers of Health   Financial Resource Strain: Patient Declined (04/28/2024)   Overall Financial Resource Strain (CARDIA)    Difficulty of Paying Living Expenses: Patient declined  Food Insecurity: Patient Declined (04/28/2024)   Hunger Vital Sign    Worried About Running Out of Food in the Last Year: Patient declined    Ran Out of Food in the Last Year: Patient declined  Transportation Needs: Patient Declined (04/28/2024)   PRAPARE - Administrator, Civil Service (Medical): Patient declined    Lack of Transportation (Non-Medical): Patient declined  Physical Activity: Insufficiently Active (04/28/2024)   Exercise Vital Sign    Days of Exercise per Week: 3 days    Minutes of Exercise per Session: 20 min  Stress: No Stress Concern Present (04/28/2024)   [employer] of Occupational Health - Occupational Stress Questionnaire    Feeling of Stress: Not at all  Social Connections: Unknown (04/28/2024)   Social Connection and Isolation Panel    Frequency of Communication with Friends and Family: Patient declined    Frequency of Social Gatherings with Friends and Family: Patient declined    Attends Religious Services: Patient declined    Database administrator or Organizations: Patient declined    Attends Engineer, structural: Not on file    Marital Status: Married  Recent Concern: Social Connections - Moderately Isolated (03/17/2024)   Social Connection and Isolation Panel    Frequency of Communication with Friends and Family: Once a week    Frequency of Social Gatherings with Friends and Family: Once a week    Attends  Religious Services: Never    Database administrator or Organizations: No    Attends Engineer, structural: More than 4 times per year    Marital Status: Married  Catering manager Violence: Not on file    Family History:   Family History  Problem Relation Age of Onset   Breast cancer Mother 59   Hypertension Mother    Cancer Mother    Stroke Mother    Cancer Maternal Grandfather    Heart disease Maternal Grandfather    Heart disease Paternal Grandmother    Colon cancer Paternal Grandfather    Cancer Paternal  Grandfather        testicular   Cancer Father        associated with colon polyps   Heart disease Father    Hyperlipidemia Father    Breast cancer Maternal Aunt      ROS:  Please see the history of present illness.  All other ROS reviewed and negative.     Physical Exam/Data: Vitals:   05/01/24 0600 05/01/24 0630 05/01/24 0900 05/01/24 0938  BP: 139/72 (!) 144/75 125/65   Pulse: 60 63 62   Resp: 17 14 18    Temp:    98.3 F (36.8 C)  TempSrc:    Oral  SpO2: 99% 99% 99%   Weight:      Height:       No intake or output data in the 24 hours ending 05/01/24 0950    05/01/2024    1:20 AM 03/21/2024    3:49 PM 10/13/2023    9:11 AM  Last 3 Weights  Weight (lbs) 171 lb 1.2 oz 171 lb 169 lb  Weight (kg) 77.6 kg 77.565 kg 76.658 kg     Body mass index is 31.29 kg/m.  General:  Well nourished, well developed, in no acute distress HEENT: normal Neck: no JVD Vascular: No carotid bruits; Distal pulses 2+ bilaterally Cardiac:  normal S1, S2; RRR; no murmur  Lungs:  clear to auscultation bilaterally, no wheezing, rhonchi or rales  Abd: soft, nontender, no hepatomegaly  Ext: no edema Musculoskeletal:  No deformities, BUE and BLE strength normal and equal Skin: warm and dry  Neuro:  CNs 2-12 intact, no focal abnormalities noted Psych:  Normal affect   EKG:  The EKG was personally reviewed and demonstrates: Sinus rhythm heart rate 61.  New biphasic T wave in  V2.  No ST elevation. Telemetry:  Telemetry was personally reviewed and demonstrates: Sinus rhythm heart rates in the 60s.  Relevant CV Studies:  Laboratory Data: High Sensitivity Troponin:   Recent Labs  Lab 05/01/24 0129 05/01/24 0406  TROPONINIHS 201* 224*     Chemistry Recent Labs  Lab 05/01/24 0129  NA 139  K 4.4  CL 103  CO2 27  GLUCOSE 112*  BUN 17  CREATININE 0.93  CALCIUM  9.5  GFRNONAA >60  ANIONGAP 9    No results for input(s): PROT, ALBUMIN, AST, ALT, ALKPHOS, BILITOT in the last 168 hours. Lipids No results for input(s): CHOL, TRIG, HDL, LABVLDL, LDLCALC, CHOLHDL in the last 168 hours.  Hematology Recent Labs  Lab 05/01/24 0129  WBC 7.7  RBC 4.47  HGB 13.5  HCT 41.5  MCV 92.8  MCH 30.2  MCHC 32.5  RDW 12.5  PLT 169   Thyroid No results for input(s): TSH, FREET4 in the last 168 hours.  BNPNo results for input(s): BNP, PROBNP in the last 168 hours.  DDimer No results for input(s): DDIMER in the last 168 hours.  Radiology/Studies:  DG Chest 2 View Result Date: 05/01/2024 CLINICAL DATA:  Chest pain for 2 days EXAM: CHEST - 2 VIEW COMPARISON:  Radiographs 12/15/2013 FINDINGS: The heart size and mediastinal contours are within normal limits. Both lungs are clear. The visualized skeletal structures are unremarkable. IMPRESSION: No active cardiopulmonary disease. Electronically Signed   By: Norman Gatlin M.D.   On: 05/01/2024 01:52     Assessment and Plan:  NSTEMI Patient reporting new onset chest pain that has woken her up from her sleep in the past several days with radiation to her neck accompanied with  some nausea and sweatiness.  Has strong family history of MIs with risk factors.  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  Concern for LAD stenosis.  Troponins 201-224.  We will plan for cardiac catheterization 6/30.  Needs close monitoring,  no chest pain currently.  Continue with IV heparin, given aspirin  loading dose, no beta-blocker with borderline bradycardia, initiate atorvastatin  80 mg. Check LP(a), lipid panel.  Obtain echocardiogram.   Hypertension She reports severely elevated blood pressures in the 200s during acute episodes.  I think this is more related to pain/discomfort.  At baseline has blood pressure that is well-controlled in the 120s. Continue with lisinopril  10 mg  Hyperlipidemia Checking LDL and starting atorvastatin  80 mg.  Informed Consent   Shared Decision Making/Informed Consent{  The risks [stroke (1 in 1000), death (1 in 1000), kidney failure [usually temporary] (1 in 500), bleeding (1 in 200), allergic reaction [possibly serious] (1 in 200)], benefits (diagnostic support and management of coronary artery disease) and alternatives of a cardiac catheterization were discussed in detail with Kathy Stephens and she is willing to proceed.        Risk Assessment/Risk Scores:  TIMI Risk Score for Unstable Angina or Non-ST Elevation MI:   The patient's TIMI risk score is 5, which indicates a 26% risk of all cause mortality, new or recurrent myocardial infarction or need for urgent revascularization in the next 14 days.         For questions or updates, please contact [facility] Please consult [email or web address] for contact info under    Signed, Thom LITTIE Sluder, PA-C  05/01/2024 9:50 AM    Attending Note   Patient seen and discussed with PA Sluder, I agree with his documentation. 63 yo female history of ulcerative colitis, depression, fibromyalgia, HTN, HLD, prior splenic infarction, prior TIA, presents with chest pain. Multiple recent episodes left chest radiating into neck associated with nausea, diaphoresis. Recent fatigue and SOB/DOE with activities.   K 4.4 BUN 17 Cr 0.93 WBC 7.7 Hgb 13.5 Plt 169  Trop 201-->224 EKG SR, subtle inferior ST depressions though chronic appearing, V1/V2  TWIs CXR: no acute process  Echo pending   04/2022 echo: LVEF 60-65%, normal diastolic, normal RV function 04/2021 coronary calcium : calcium  score of 0    1.NSTEMI - multiple CAD risk factors with strong family history, HTN, HLD, tobacco use.  - presents with chest pain, trop up to 224 without clear peak. EKG anterior TWIs.  - medical therapy with ASA 81, atorva 80, hep gtt, lisinopril  10mg . Low normal HRs, would avoid beta blocker - echo pending - would plan for cath Monday  Dorn Ross MD

## 2024-05-01 NOTE — ED Provider Notes (Signed)
 [facility] Provider Note   CSN: [id number] Arrival date & time: 05/01/24  0057     History Chief Complaint  Patient presents with   Chest Pain    HPI Kathy Stephens is a 63 y.o. female presenting for severe right sided chest pain radiating into her right chest.  States that it has been waxing anhd waining over the past 48hours. BP always above 170 with the episode.  Last episode at 1230 Woke up from sleep. 3x episodes over the past 48 hours. Saw cardiology 4 years ago for similar sx and had negattive CT coronary at that time.  Patient's recorded medical, surgical, social, medication list and allergies were reviewed in the Snapshot window as part of the initial history.   Review of Systems   Review of Systems  Constitutional:  Negative for chills and fever.  HENT:  Negative for ear pain and sore throat.   Eyes:  Negative for pain and visual disturbance.  Respiratory:  Negative for cough and shortness of breath.   Cardiovascular:  Positive for chest pain. Negative for palpitations.  Gastrointestinal:  Negative for abdominal pain and vomiting.  Genitourinary:  Negative for dysuria and hematuria.  Musculoskeletal:  Negative for arthralgias and back pain.  Skin:  Negative for color change and rash.  Neurological:  Negative for seizures and syncope.  All other systems reviewed and are negative.   Physical Exam Updated Vital Signs BP (!) 144/75   Pulse 63   Temp 97.9 F (36.6 C) (Oral)   Resp 14   Ht 5' 2 (1.575 m)   Wt 77.6 kg   LMP 11/03/2006   SpO2 99%   BMI 31.29 kg/m  Physical Exam Vitals and nursing note reviewed.  Constitutional:      General: She is not in acute distress.    Appearance: She is well-developed.  HENT:     Head: Normocephalic and atraumatic.   Eyes:     Conjunctiva/sclera: Conjunctivae normal.    Cardiovascular:     Rate and Rhythm: Normal rate and regular rhythm.     Heart sounds: No  murmur heard. Pulmonary:     Effort: Pulmonary effort is normal. No respiratory distress.     Breath sounds: Normal breath sounds.  Abdominal:     General: There is no distension.     Palpations: Abdomen is soft.     Tenderness: There is no abdominal tenderness. There is no right CVA tenderness or left CVA tenderness.   Musculoskeletal:        General: No swelling or tenderness. Normal range of motion.     Cervical back: Neck supple.   Skin:    General: Skin is warm and dry.   Neurological:     General: No focal deficit present.     Mental Status: She is alert and oriented to person, place, and time. Mental status is at baseline.     Cranial Nerves: No cranial nerve deficit.      ED Course/ Medical Decision Making/ A&P    Procedures .Critical Care  Performed by: Jerral Meth, MD Authorized by: Jerral Meth, MD   Critical care provider statement:    Critical care time (minutes):  90   Critical care was necessary to treat or prevent imminent or life-threatening deterioration of the following conditions:  Cardiac failure   Critical care was time spent personally by me on the following activities:  Development of treatment plan with patient or surrogate,  discussions with consultants, evaluation of patient's response to treatment, examination of patient, ordering and review of laboratory studies, ordering and review of radiographic studies, ordering and performing treatments and interventions, pulse oximetry, re-evaluation of patient's condition and review of old charts    Medications Ordered in ED Medications  nitroGLYCERIN  (NITROSTAT ) SL tablet 0.4 mg (has no administration in time range)  heparin ADULT infusion 100 units/mL (25000 units/250mL) (1,000 Units/hr Intravenous New Bag/Given 05/01/24 0709)  aspirin  EC tablet 81 mg (has no administration in time range)  aspirin  chewable tablet 324 mg (324 mg Oral Given 05/01/24 0404)  heparin bolus via infusion 4,000 Units  (4,000 Units Intravenous Bolus from Bag 05/01/24 0709)   Medical Decision Making: Kathy Stephens is a 63 y.o. female who presented to the ED today with chest pain, detailed above.     Patient placed on continuous vitals and telemetry monitoring while in ED which was reviewed periodically.  Complete initial physical exam performed, notably the patient was HDS in NAD.   Reviewed and confirmed nursing documentation for past medical history, family history, social history.    Initial Assessment: With the patient's presentation of left-sided chest pain, most likely diagnosis is musculoskeletal chest pain versus GERD, although ACS remains on the differential. Other diagnoses were considered including (but not limited to) pulmonary embolism, community-acquired pneumonia, aortic dissection, pneumothorax, underlying bony abnormality, anemia. These are considered less likely due to history of present illness and physical exam findings.    In particular, concerning pulmonary embolism:  they deny malignancy, recent surgery, history of DVT, or calf tenderness leading to a low risk Wells score. Aortic Dissection also reconsidered but seems less likely based on the location, quality, onset, and severity of symptoms in this case. Patient also has a lack of underlying history of AD or TAA.  This is most consistent with an acute life/limb threatening illness complicated by underlying chronic conditions.   Initial Plan: EKG and serial troponin to evaluate for cardiac pathology. Evaluate for dissection, bony abnormality, or pneumonia with chest x-ray and screening laboratory evaluation including CBC, BMP  Further evaluation for pulmonary embolism not indicated at this time based on patient's PERC and Wells score.  Further evaluation for Thoracic Aortic Dissection not indicated at this time based on patient's clinical history and PE findings.   Initial Study Results: EKG was reviewed independently. Rate, rhythm,  axis, intervals all examined and without medically relevant abnormality. ST segments without concerns for elevations.    Laboratory  Initial troponin elevated.  CBC and BMP without obvious metabolic or inflammatory abnormalities requiring further evaluation   Radiology  DG Chest 2 View Result Date: 05/01/2024 CLINICAL DATA:  Chest pain for 2 days EXAM: CHEST - 2 VIEW COMPARISON:  Radiographs 12/15/2013 FINDINGS: The heart size and mediastinal contours are within normal limits. Both lungs are clear. The visualized skeletal structures are unremarkable. IMPRESSION: No active cardiopulmonary disease. Electronically Signed   By: Norman Gatlin M.D.   On: 05/01/2024 01:52    Final Assessment and Plan: Cardiology to see, medicine to admit for hypertensive crisis versus NSTEMI.  Started on heparin per medicine recommendation pending cardiology evaluation. Serial troponin still pending.    Clinical Impression:  1. Chest pain, unspecified type   2. NSTEMI (non-ST elevated myocardial infarction) (HCC)      Data Unavailable   Final Clinical Impression(s) / ED Diagnoses Final diagnoses:  Chest pain, unspecified type  NSTEMI (non-ST elevated myocardial infarction) (HCC)    Rx / DC Orders ED  Discharge Orders     None         Jerral Meth, MD 05/01/24 [phone]

## 2024-05-01 NOTE — ED Notes (Signed)
 Patient transported to X-ray

## 2024-05-01 NOTE — H&P (View-Only) (Signed)
 Cardiology Consultation   Patient ID: Kathy Stephens MRN: [id number]; DOB: [birth year]  Admit date: 05/01/2024 Date of Consult: 05/01/2024  PCP:  Chandra Toribio POUR, MD   [facility] Providers Cardiologist:  Newman JINNY Lawrence, MD    Patient Profile: Kathy Stephens is a 63 y.o. female with a hx of hypertension, hyperlipidemia, nicotine dependence 20+ pack year history, multiple TIAs 2024, splenic infarct, who is being seen 05/01/2024 for the evaluation of NSTEMI at the request of Dr. Georgina.  History of Present Illness: [name] has no significant past cardiac history.  She was seen in 2022 by cards for chest pain, had stress test that was normal, calcium  score of 0.  Of note she does have family history of MIs in both of her parents.  Not a known diabetic, history of frequent TIAs last year.  No drugs, alcohol.  Is a smoker.  Currently patient being evaluated for NSTEMI.  Troponin 201-224.  Started on IV heparin and given aspirin  load.  EKG showing new biphasic T wave in V2.  No ST elevation.  Not currently having chest pain.  Noted to be hypertensive on arrival with systolics in the 200s but now BP has normalized in the 120s.    Patient reports that since seen Dr. Lawrence in 2022 she has not had any chest pain however within the past week she has had multiple episodes of chest pain that have woken her up from her sleep, reports radiation into her neck, nausea, some sweatiness.  She had an episode 2 days ago that was more severe and much more persistent.  Also reports decrease in functional status and noted to be more fatigued and short of breath with mild activity such as walking her dog and which she did not experience before.  Otherwise does not complain of any chest pain right now.  Denies any shortness of breath now, peripheral edema, orthopnea, palpitations.  Initially reports intermittent spikes of elevated blood pressures during these episodes of chest pain but  traditionally has very well-controlled blood pressure off lisinopril  and in the 120s.  Chest x-ray negative.  Potassium 4.4.  Creatinine 0.93.  Hemoglobin 13.5.  Past Medical History:  Diagnosis Date   Allergy    Anemia    Anxiety    Cancer (HCC)    skin (nose & face)   Depression    Fibromyalgia 04/09/2021   GERD (gastroesophageal reflux disease)    Inflammatory bowel disease (ulcerative colitis) (HCC)    Migraines    while on Liada (mesalazine), not on it at this time   Osteopenia     Past Surgical History:  Procedure Laterality Date   CESAREAN SECTION     CHOLECYSTECTOMY  03/19/2012   Procedure: LAPAROSCOPIC CHOLECYSTECTOMY;  Surgeon: Lynwood MALVA Pina, MD;  Location: [facility] OR;  Service: General;  Laterality: N/A;   COLONOSCOPY WITH PROPOFOL  N/A 12/16/2022   Procedure: COLONOSCOPY WITH BIOPSY;  Surgeon: Unk Corinn Skiff, MD;  Location: [facility];  Service: Endoscopy;  Laterality: N/A;   FOOT SURGERY  1980's   LAPAROSCOPY     with cystectomy   LEFT OOPHORECTOMY     tendon relief     TUBAL LIGATION       Scheduled Meds:  [START ON 05/02/2024] aspirin  EC  81 mg Oral Daily   famotidine  20 mg Oral BID   lisinopril   10 mg Oral Daily   nicotine  14 mg Transdermal Daily   Continuous Infusions:  heparin 1,000 Units/hr (  05/01/24 0709)   PRN Meds: nitroGLYCERIN   Allergies:    Allergies  Allergen Reactions   Other Other (See Comments)    Nut Meg: Throat Swelling/Burning    Azithromycin Rash and Other (See Comments)    Tingly in face and lips    Social History:   Social History   Socioeconomic History   Marital status: Married    Spouse name: Not on file   Number of children: 1   Years of education: Not on file   Highest education level: 12th grade  Occupational History   Not on file  Tobacco Use   Smoking status: Every Day    Types: Cigars    Passive exposure: Current   Smokeless tobacco: Never   Tobacco comments:    5 daily  Vaping Use   Vaping  status: Never Used  Substance and Sexual Activity   Alcohol use: Yes    Alcohol/week: 0.0 - 1.0 standard drinks of alcohol    Comment: socially   Drug use: No   Sexual activity: Yes    Partners: Male    Birth control/protection: Surgical    Comment: BTL  Other Topics Concern   Not on file  Social History Narrative   Not on file   Social Drivers of Health   Financial Resource Strain: Patient Declined (04/28/2024)   Overall Financial Resource Strain (CARDIA)    Difficulty of Paying Living Expenses: Patient declined  Food Insecurity: Patient Declined (04/28/2024)   Hunger Vital Sign    Worried About Running Out of Food in the Last Year: Patient declined    Ran Out of Food in the Last Year: Patient declined  Transportation Needs: Patient Declined (04/28/2024)   PRAPARE - Administrator, Civil Service (Medical): Patient declined    Lack of Transportation (Non-Medical): Patient declined  Physical Activity: Insufficiently Active (04/28/2024)   Exercise Vital Sign    Days of Exercise per Week: 3 days    Minutes of Exercise per Session: 20 min  Stress: No Stress Concern Present (04/28/2024)   [employer] of Occupational Health - Occupational Stress Questionnaire    Feeling of Stress: Not at all  Social Connections: Unknown (04/28/2024)   Social Connection and Isolation Panel    Frequency of Communication with Friends and Family: Patient declined    Frequency of Social Gatherings with Friends and Family: Patient declined    Attends Religious Services: Patient declined    Database administrator or Organizations: Patient declined    Attends Engineer, structural: Not on file    Marital Status: Married  Recent Concern: Social Connections - Moderately Isolated (03/17/2024)   Social Connection and Isolation Panel    Frequency of Communication with Friends and Family: Once a week    Frequency of Social Gatherings with Friends and Family: Once a week    Attends  Religious Services: Never    Database administrator or Organizations: No    Attends Engineer, structural: More than 4 times per year    Marital Status: Married  Catering manager Violence: Not on file    Family History:   Family History  Problem Relation Age of Onset   Breast cancer Mother 59   Hypertension Mother    Cancer Mother    Stroke Mother    Cancer Maternal Grandfather    Heart disease Maternal Grandfather    Heart disease Paternal Grandmother    Colon cancer Paternal Grandfather    Cancer Paternal  Grandfather        testicular   Cancer Father        associated with colon polyps   Heart disease Father    Hyperlipidemia Father    Breast cancer Maternal Aunt      ROS:  Please see the history of present illness.  All other ROS reviewed and negative.     Physical Exam/Data: Vitals:   05/01/24 0600 05/01/24 0630 05/01/24 0900 05/01/24 0938  BP: 139/72 (!) 144/75 125/65   Pulse: 60 63 62   Resp: 17 14 18    Temp:    98.3 F (36.8 C)  TempSrc:    Oral  SpO2: 99% 99% 99%   Weight:      Height:       No intake or output data in the 24 hours ending 05/01/24 0950    05/01/2024    1:20 AM 03/21/2024    3:49 PM 10/13/2023    9:11 AM  Last 3 Weights  Weight (lbs) 171 lb 1.2 oz 171 lb 169 lb  Weight (kg) 77.6 kg 77.565 kg 76.658 kg     Body mass index is 31.29 kg/m.  General:  Well nourished, well developed, in no acute distress HEENT: normal Neck: no JVD Vascular: No carotid bruits; Distal pulses 2+ bilaterally Cardiac:  normal S1, S2; RRR; no murmur  Lungs:  clear to auscultation bilaterally, no wheezing, rhonchi or rales  Abd: soft, nontender, no hepatomegaly  Ext: no edema Musculoskeletal:  No deformities, BUE and BLE strength normal and equal Skin: warm and dry  Neuro:  CNs 2-12 intact, no focal abnormalities noted Psych:  Normal affect   EKG:  The EKG was personally reviewed and demonstrates: Sinus rhythm heart rate 61.  New biphasic T wave in  V2.  No ST elevation. Telemetry:  Telemetry was personally reviewed and demonstrates: Sinus rhythm heart rates in the 60s.  Relevant CV Studies:  Laboratory Data: High Sensitivity Troponin:   Recent Labs  Lab 05/01/24 0129 05/01/24 0406  TROPONINIHS 201* 224*     Chemistry Recent Labs  Lab 05/01/24 0129  NA 139  K 4.4  CL 103  CO2 27  GLUCOSE 112*  BUN 17  CREATININE 0.93  CALCIUM  9.5  GFRNONAA >60  ANIONGAP 9    No results for input(s): PROT, ALBUMIN, AST, ALT, ALKPHOS, BILITOT in the last 168 hours. Lipids No results for input(s): CHOL, TRIG, HDL, LABVLDL, LDLCALC, CHOLHDL in the last 168 hours.  Hematology Recent Labs  Lab 05/01/24 0129  WBC 7.7  RBC 4.47  HGB 13.5  HCT 41.5  MCV 92.8  MCH 30.2  MCHC 32.5  RDW 12.5  PLT 169   Thyroid No results for input(s): TSH, FREET4 in the last 168 hours.  BNPNo results for input(s): BNP, PROBNP in the last 168 hours.  DDimer No results for input(s): DDIMER in the last 168 hours.  Radiology/Studies:  DG Chest 2 View Result Date: 05/01/2024 CLINICAL DATA:  Chest pain for 2 days EXAM: CHEST - 2 VIEW COMPARISON:  Radiographs 12/15/2013 FINDINGS: The heart size and mediastinal contours are within normal limits. Both lungs are clear. The visualized skeletal structures are unremarkable. IMPRESSION: No active cardiopulmonary disease. Electronically Signed   By: Norman Gatlin M.D.   On: 05/01/2024 01:52     Assessment and Plan:  NSTEMI Patient reporting new onset chest pain that has woken her up from her sleep in the past several days with radiation to her neck accompanied with  some nausea and sweatiness.  Has strong family history of MIs with risk factors.  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  Concern for LAD stenosis.  Troponins 201-224.  We will plan for cardiac catheterization 6/30.  Needs close monitoring,  no chest pain currently.  Continue with IV heparin, given aspirin  loading dose, no beta-blocker with borderline bradycardia, initiate atorvastatin  80 mg. Check LP(a), lipid panel.  Obtain echocardiogram.   Hypertension She reports severely elevated blood pressures in the 200s during acute episodes.  I think this is more related to pain/discomfort.  At baseline has blood pressure that is well-controlled in the 120s. Continue with lisinopril  10 mg  Hyperlipidemia Checking LDL and starting atorvastatin  80 mg.  Informed Consent   Shared Decision Making/Informed Consent{  The risks [stroke (1 in 1000), death (1 in 1000), kidney failure [usually temporary] (1 in 500), bleeding (1 in 200), allergic reaction [possibly serious] (1 in 200)], benefits (diagnostic support and management of coronary artery disease) and alternatives of a cardiac catheterization were discussed in detail with Ms. Deguzman and she is willing to proceed.        Risk Assessment/Risk Scores:  TIMI Risk Score for Unstable Angina or Non-ST Elevation MI:   The patient's TIMI risk score is 5, which indicates a 26% risk of all cause mortality, new or recurrent myocardial infarction or need for urgent revascularization in the next 14 days.         For questions or updates, please contact [facility] Please consult [email or web address] for contact info under    Signed, Thom LITTIE Sluder, PA-C  05/01/2024 9:50 AM    Attending Note   Patient seen and discussed with PA Sluder, I agree with his documentation. 63 yo female history of ulcerative colitis, depression, fibromyalgia, HTN, HLD, prior splenic infarction, prior TIA, presents with chest pain. Multiple recent episodes left chest radiating into neck associated with nausea, diaphoresis. Recent fatigue and SOB/DOE with activities.   K 4.4 BUN 17 Cr 0.93 WBC 7.7 Hgb 13.5 Plt 169  Trop 201-->224 EKG SR, subtle inferior ST depressions though chronic appearing, V1/V2  TWIs CXR: no acute process  Echo pending   04/2022 echo: LVEF 60-65%, normal diastolic, normal RV function 04/2021 coronary calcium : calcium  score of 0    1.NSTEMI - multiple CAD risk factors with strong family history, HTN, HLD, tobacco use.  - presents with chest pain, trop up to 224 without clear peak. EKG anterior TWIs.  - medical therapy with ASA 81, atorva 80, hep gtt, lisinopril  10mg . Low normal HRs, would avoid beta blocker - echo pending - would plan for cath Monday  Dorn Ross MD

## 2024-05-01 NOTE — ED Notes (Signed)
 Patient currently denies chest pain or discomfort. Husband and son at bedside.

## 2024-05-01 NOTE — Progress Notes (Signed)
 PHARMACY - ANTICOAGULATION CONSULT NOTE  Pharmacy Consult for Heparin Indication: chest pain/ACS  Allergies  Allergen Reactions   Azithromycin Rash and Other (See Comments)    Tingly in face and lips    Patient Measurements: Height: 5' 2 (157.5 cm) Weight: 77.6 kg (171 lb 1.2 oz) IBW/kg (Calculated) : 50.1 HEPARIN DW (KG): 67.1  Vital Signs: Temp: 97.9 F (36.6 C) (06/29 0517) Temp Source: Oral (06/29 0517) BP: 144/75 (06/29 0630) Pulse Rate: 63 (06/29 0630)  Labs: Recent Labs    05/01/24 0129  HGB 13.5  HCT 41.5  PLT 169  CREATININE 0.93  TROPONINIHS 201*    Estimated Creatinine Clearance: 61.3 mL/min (by C-G formula based on SCr of 0.93 mg/dL).   Medical History: Past Medical History:  Diagnosis Date   Allergy    Anemia    Anxiety    Cancer (HCC)    skin (nose & face)   Depression    Fibromyalgia 04/09/2021   GERD (gastroesophageal reflux disease)    Inflammatory bowel disease (ulcerative colitis) (HCC)    Migraines    while on Liada (mesalazine), not on it at this time   Osteopenia     Medications:  No current facility-administered medications on file prior to encounter.   Current Outpatient Medications on File Prior to Encounter  Medication Sig Dispense Refill   aspirin  EC 81 MG tablet Take 1 tablet (81 mg total) by mouth daily. Swallow whole. 30 tablet 12   cycloSPORINE (RESTASIS) 0.05 % ophthalmic emulsion 1 drop 2 (two) times daily.     diphenhydrAMINE (BENADRYL) 25 mg capsule Take 25 mg by mouth every 6 (six) hours as needed for sleep.     Emollient (COLLAGEN EX) Take 20 mg by mouth daily.     famotidine (PEPCID) 20 MG tablet Take 20 mg by mouth 2 (two) times daily.     lisinopril  (ZESTRIL ) 10 MG tablet Take 1 tablet (10 mg total) by mouth daily. 90 tablet 3   loperamide (IMODIUM A-D) 2 MG capsule Take by mouth as needed for diarrhea or loose stools.       Assessment: 63 y.o. female with chest pain for heparin  Goal of Therapy:  Heparin  level 0.3-0.7 units/ml Monitor platelets by anticoagulation protocol: Yes   Plan:  Heparin 4000 units IV bolus, then start heparin 1000 units/hr Check heparin level in 6 hours.   [name] 05/01/2024,6:41 AM

## 2024-05-01 NOTE — H&P (Signed)
 History and Physical    Patient: Kathy Stephens FMW:[id number] DOB: Jan 11, 1961 DOA: 05/01/2024 DOS: the patient was seen and examined on 05/01/2024 PCP: Chandra Toribio POUR, MD  Patient coming from: Home  Chief Complaint:  Chief Complaint  Patient presents with   Chest Pain   HPI: Kathy Stephens is a 63 y.o. female with medical history significant of HTN, HLD, active smoker (cigars; 20 pack-year history), multiple TIAs in 2024, splenic infarct, and vitamin D  deficiency presents emergency department complaining of chest pain w/ elevated BP for last 2 to 3 days and found to have NSTEMI.  Pt states that she was in her USOH until this past Tuesday when she started having cp. The cp would come on strong with physical activity, and feel like a vice-grip, lasting for about 30 minutes before improving with rest. She reports having to limit her activity since even minimal activity such as walking her dog would bring on the squeezing cp in less than a block. On Thursday, she was evaluated by her neighbor who is trained as an Charity fundraiser and noted to have a BP 200s/100s for which she again had cp and took 4 baby aspirins prior to the pain subsiding with rest. On Friday, she contacted her PCP and requested an appointment on this upcoming Monday, and was advised to report to the nearest ED if she had cp or elevated BP. On Sat morning, her cp awoke her out of her sleep at 0530 and she again felt the squeezing cp which she described as 8/10 and it radiated to her neck. She went downstairs and rested on the couch before the pain settled out. Finally on Sunday morning at 0001 she had repeat cp that she described as 10/10 and presented to the ED for evaluation. Of note, she does endorse an extensive family history of heart disease with mom and dad having heart attacks and multiple stents.  At presentation to the ED, patient found hypertensive blood pressure 176/89 otherwise hemodynamically stable. Elevated troponin 201-->224.  CBC unremarkable. BMP unremarkable. EKG showed normal sinus rhythm, T wave inversion in lead aVR, aVL, V1, V2, V4 and V6. In the ED patient has been given aspirin  load 324 mg and sublingual nitroglycerin . Chest x-ray no active disease process.  Review of Systems: As mentioned in the history of present illness. All other systems reviewed and are negative. Past Medical History:  Diagnosis Date   Allergy    Anemia    Anxiety    Cancer (HCC)    skin (nose & face)   Depression    Fibromyalgia 04/09/2021   GERD (gastroesophageal reflux disease)    Inflammatory bowel disease (ulcerative colitis) (HCC)    Migraines    while on Liada (mesalazine), not on it at this time   Osteopenia    Past Surgical History:  Procedure Laterality Date   CESAREAN SECTION     CHOLECYSTECTOMY  03/19/2012   Procedure: LAPAROSCOPIC CHOLECYSTECTOMY;  Surgeon: Lynwood MALVA Pina, MD;  Location: [facility];  Service: General;  Laterality: N/A;   COLONOSCOPY WITH PROPOFOL  N/A 12/16/2022   Procedure: COLONOSCOPY WITH BIOPSY;  Surgeon: Unk Corinn Skiff, MD;  Location: [facility];  Service: Endoscopy;  Laterality: N/A;   FOOT SURGERY  1980's   LAPAROSCOPY     with cystectomy   LEFT OOPHORECTOMY     tendon relief     TUBAL LIGATION     Social History:  reports that she has been smoking cigars. She has been exposed  to tobacco smoke. She has never used smokeless tobacco. She reports current alcohol use. She reports that she does not use drugs.  Allergies  Allergen Reactions   Azithromycin Rash and Other (See Comments)    Tingly in face and lips    Family History  Problem Relation Age of Onset   Breast cancer Mother 32   Hypertension Mother    Cancer Mother    Stroke Mother    Cancer Maternal Grandfather    Heart disease Maternal Grandfather    Heart disease Paternal Grandmother    Colon cancer Paternal Grandfather    Cancer Paternal Grandfather        testicular   Cancer Father        associated with  colon polyps   Heart disease Father    Hyperlipidemia Father    Breast cancer Maternal Aunt     Prior to Admission medications   Medication Sig Start Date End Date Taking? Authorizing Provider  aspirin  EC 81 MG tablet Take 1 tablet (81 mg total) by mouth daily. Swallow whole. 05/13/23   Penumalli, Vikram R, MD  cycloSPORINE (RESTASIS) 0.05 % ophthalmic emulsion 1 drop 2 (two) times daily.    [provider]  diphenhydrAMINE (BENADRYL) 25 mg capsule Take 25 mg by mouth every 6 (six) hours as needed for sleep.    [provider]  Emollient (COLLAGEN EX) Take 20 mg by mouth daily.    [provider]  famotidine (PEPCID) 20 MG tablet Take 20 mg by mouth 2 (two) times daily.    [provider]  lisinopril  (ZESTRIL ) 10 MG tablet Take 1 tablet (10 mg total) by mouth daily. 09/14/23   Chandra Toribio POUR, MD  loperamide (IMODIUM A-D) 2 MG capsule Take by mouth as needed for diarrhea or loose stools.    [provider]    Physical Exam: Vitals:   05/01/24 0517 05/01/24 0530 05/01/24 0600 05/01/24 0630  BP:  (!) 146/86 139/72 (!) 144/75  Pulse:  (!) 59 60 63  Resp:  15 17 14   Temp: 97.9 F (36.6 C)     TempSrc: Oral     SpO2:  100% 99% 99%  Weight:      Height:       General: Alert, oriented x3, resting comfortably in no acute distress Respiratory: Lungs clear to auscultation bilaterally with normal respiratory effort; no w/r/r Cardiovascular: Regular rate and rhythm w/o m/r/g   Data Reviewed:  Lab Results  Component Value Date   WBC 7.7 05/01/2024   HGB 13.5 05/01/2024   HCT 41.5 05/01/2024   MCV 92.8 05/01/2024   PLT 169 05/01/2024   Lab Results  Component Value Date   GLUCOSE 112 (H) 05/01/2024   CALCIUM  9.5 05/01/2024   NA 139 05/01/2024   K 4.4 05/01/2024   CO2 27 05/01/2024   CL 103 05/01/2024   BUN 17 05/01/2024   CREATININE 0.93 05/01/2024   Lab Results  Component Value Date   ALT 10 05/12/2023   AST 15 05/12/2023    ALKPHOS 84 05/12/2023   BILITOT 0.7 05/12/2023   Lab Results  Component Value Date   INR 0.9 05/12/2023   INR 1.0 03/14/2022    Radiology: DG Chest 2 View Result Date: 05/01/2024 CLINICAL DATA:  Chest pain for 2 days EXAM: CHEST - 2 VIEW COMPARISON:  Radiographs 12/15/2013 FINDINGS: The heart size and mediastinal contours are within normal limits. Both lungs are clear. The visualized skeletal structures are unremarkable. IMPRESSION:  No active cardiopulmonary disease. Electronically Signed   By: Norman Gatlin M.D.   On: 05/01/2024 01:52    Assessment and Plan: 87F h/o HTN, HLD, active smoker (cigars; 20 pack-year history), multiple TIAs in 2024, splenic infarct, and vitamin D  deficiency presents emergency department complaining of chest pain w/ elevated BP for last 2 to 3 days and found to have NSTEMI.  NSTEMI -Cards consulted; apprec eval/recs -Continue hep gtt for now -F/u risk stratification with TSH, A1c, and lipid profile  Tobacco use -Smoking cessation counseling provided -Nicotine patch while admitted (pt refused)  HTN -PTA lisinopril  10mg  daily; titrate prn for BP goal <140/90   Advance Care Planning:   Code Status: Full Code   Consults: Cards  Family Communication: Husband  Severity of Illness: The appropriate patient status for this patient is INPATIENT. Inpatient status is judged to be reasonable and necessary in order to provide the required intensity of service to ensure the patient's safety. The patient's presenting symptoms, physical exam findings, and initial radiographic and laboratory data in the context of their chronic comorbidities is felt to place them at high risk for further clinical deterioration. Furthermore, it is not anticipated that the patient will be medically stable for discharge from the hospital within 2 midnights of admission.   * I certify that at the point of admission it is my clinical judgment that the patient will require inpatient  hospital care spanning beyond 2 midnights from the point of admission due to high intensity of service, high risk for further deterioration and high frequency of surveillance required.*   ------- I spent 55 minutes reviewing previous labs/notes, obtaining separate history at the bedside, counseling/discussing the treatment plan outlined above, ordering medications/tests, and performing clinical documentation.,  Author: Marsha Ada, MD 05/01/2024 7:55 AM  For on call review [email or web address].

## 2024-05-01 NOTE — ED Triage Notes (Signed)
 The pt is  c/o chest pain for 2-3 days and high bp  she has an appointment with her doctor Monday   her chest pain was worse tonight

## 2024-05-01 NOTE — ED Notes (Signed)
 Heparin continues to infuse a this time

## 2024-05-01 NOTE — ED Provider Notes (Signed)
 I was told this patient eloped from the lobby. I reviewed her labs with positive troponin and EKG changes.  Given her chief complaint of chest pain, it is critical she return to the emergency room.  I called her at this time and she stated she would return immediately and that she was less than 5 minutes away from the department.Kathy Jerral Meth, MD 05/01/24 [phone]

## 2024-05-01 NOTE — ED Notes (Signed)
 Patient stated she was leaving due to long wait time.

## 2024-05-02 ENCOUNTER — Telehealth: Payer: Self-pay

## 2024-05-02 ENCOUNTER — Telehealth (HOSPITAL_COMMUNITY): Payer: Self-pay | Admitting: Pharmacy Technician

## 2024-05-02 ENCOUNTER — Encounter (HOSPITAL_COMMUNITY)
Admission: EM | Disposition: A | Discharge status: Home / Self Care | Payer: Self-pay | Source: Home / Self Care | Attending: Internal Medicine

## 2024-05-02 ENCOUNTER — Other Ambulatory Visit (HOSPITAL_COMMUNITY): Payer: Self-pay

## 2024-05-02 ENCOUNTER — Ambulatory Visit: Admitting: Family Medicine

## 2024-05-02 ENCOUNTER — Inpatient Hospital Stay (HOSPITAL_COMMUNITY)

## 2024-05-02 DIAGNOSIS — I2510 Atherosclerotic heart disease of native coronary artery without angina pectoris: Secondary | ICD-10-CM | POA: Diagnosis not present

## 2024-05-02 DIAGNOSIS — I219 Acute myocardial infarction, unspecified: Secondary | ICD-10-CM

## 2024-05-02 DIAGNOSIS — E785 Hyperlipidemia, unspecified: Secondary | ICD-10-CM

## 2024-05-02 DIAGNOSIS — I10 Essential (primary) hypertension: Secondary | ICD-10-CM | POA: Diagnosis not present

## 2024-05-02 DIAGNOSIS — I214 Non-ST elevation (NSTEMI) myocardial infarction: Secondary | ICD-10-CM | POA: Diagnosis not present

## 2024-05-02 DIAGNOSIS — Z006 Encounter for examination for normal comparison and control in clinical research program: Secondary | ICD-10-CM

## 2024-05-02 HISTORY — PX: CORONARY STENT INTERVENTION: CATH118234

## 2024-05-02 HISTORY — PX: LEFT HEART CATH AND CORONARY ANGIOGRAPHY: CATH118249

## 2024-05-02 HISTORY — DX: Acute myocardial infarction, unspecified: I21.9

## 2024-05-02 LAB — CBC WITH DIFFERENTIAL/PLATELET
Abs Immature Granulocytes: 0.03 10*3/uL (ref 0.00–0.07)
Basophils Absolute: 0.1 10*3/uL (ref 0.0–0.1)
Basophils Relative: 1 %
Eosinophils Absolute: 0.1 10*3/uL (ref 0.0–0.5)
Eosinophils Relative: 2 %
HCT: 37.2 % (ref 36.0–46.0)
Hemoglobin: 12.2 g/dL (ref 12.0–15.0)
Immature Granulocytes: 1 %
Lymphocytes Relative: 24 %
Lymphs Abs: 1.5 10*3/uL (ref 0.7–4.0)
MCH: 29.8 pg (ref 26.0–34.0)
MCHC: 32.8 g/dL (ref 30.0–36.0)
MCV: 90.7 fL (ref 80.0–100.0)
Monocytes Absolute: 0.4 10*3/uL (ref 0.1–1.0)
Monocytes Relative: 6 %
Neutro Abs: 4.3 10*3/uL (ref 1.7–7.7)
Neutrophils Relative %: 66 %
Platelets: 154 10*3/uL (ref 150–400)
RBC: 4.1 MIL/uL (ref 3.87–5.11)
RDW: 12.3 % (ref 11.5–15.5)
WBC: 6.4 10*3/uL (ref 4.0–10.5)
nRBC: 0 % (ref 0.0–0.2)

## 2024-05-02 LAB — CBC
HCT: 37.6 % (ref 36.0–46.0)
Hemoglobin: 12.2 g/dL (ref 12.0–15.0)
MCH: 29.7 pg (ref 26.0–34.0)
MCHC: 32.4 g/dL (ref 30.0–36.0)
MCV: 91.5 fL (ref 80.0–100.0)
Platelets: 162 10*3/uL (ref 150–400)
RBC: 4.11 MIL/uL (ref 3.87–5.11)
RDW: 12.3 % (ref 11.5–15.5)
WBC: 5.7 10*3/uL (ref 4.0–10.5)
nRBC: 0 % (ref 0.0–0.2)

## 2024-05-02 LAB — LIPID PANEL
Cholesterol: 220 mg/dL — ABNORMAL HIGH (ref 0–200)
HDL: 58 mg/dL (ref >40)
LDL Cholesterol: 139 mg/dL — ABNORMAL HIGH (ref 0–99)
Total CHOL/HDL Ratio: 3.8 ratio
Triglycerides: 114 mg/dL (ref <150)
VLDL: 23 mg/dL (ref 0–40)

## 2024-05-02 LAB — ECHOCARDIOGRAM COMPLETE
Height: 62 in
S' Lateral: 3.1 cm
Weight: 2736 [oz_av]

## 2024-05-02 LAB — BASIC METABOLIC PANEL WITH GFR
Anion gap: 9 (ref 5–15)
BUN: 16 mg/dL (ref 8–23)
CO2: 24 mmol/L (ref 22–32)
Calcium: 9.1 mg/dL (ref 8.9–10.3)
Chloride: 105 mmol/L (ref 98–111)
Creatinine, Ser: 0.86 mg/dL (ref 0.44–1.00)
GFR, Estimated: >60 mL/min (ref >60)
Glucose, Bld: 97 mg/dL (ref 70–99)
Potassium: 4 mmol/L (ref 3.5–5.1)
Sodium: 138 mmol/L (ref 135–145)

## 2024-05-02 LAB — HEPATIC FUNCTION PANEL
ALT: 10 U/L (ref 0–44)
AST: 14 U/L — ABNORMAL LOW (ref 15–41)
Albumin: 3.4 g/dL — ABNORMAL LOW (ref 3.5–5.0)
Alkaline Phosphatase: 62 U/L (ref 38–126)
Bilirubin, Direct: 0.1 mg/dL (ref 0.0–0.2)
Indirect Bilirubin: 0.8 mg/dL (ref 0.3–0.9)
Total Bilirubin: 0.9 mg/dL (ref 0.0–1.2)
Total Protein: 6.2 g/dL — ABNORMAL LOW (ref 6.5–8.1)

## 2024-05-02 LAB — POCT ACTIVATED CLOTTING TIME
Activated Clotting Time: 521 s
Activated Clotting Time: 245 s

## 2024-05-02 LAB — HEPARIN LEVEL (UNFRACTIONATED): Heparin Unfractionated: 0.33 [IU]/mL (ref 0.30–0.70)

## 2024-05-02 SURGERY — LEFT HEART CATH AND CORONARY ANGIOGRAPHY
Anesthesia: LOCAL

## 2024-05-02 MED ORDER — VERAPAMIL HCL 2.5 MG/ML IV SOLN
INTRAVENOUS | Status: AC
  Filled 2024-05-02: qty 2

## 2024-05-02 MED ORDER — NITROGLYCERIN 1 MG/10 ML FOR IR/CATH LAB
INTRA_ARTERIAL | Status: AC
  Filled 2024-05-02: qty 10

## 2024-05-02 MED ORDER — SODIUM CHLORIDE 0.9% FLUSH: 3.0000 mL | INTRAVENOUS | Status: DC | PRN

## 2024-05-02 MED ORDER — FENTANYL CITRATE (PF) 100 MCG/2ML IJ SOLN
INTRAMUSCULAR | Status: DC | PRN
  Administered 2024-05-02 (×2): 25 ug via INTRAVENOUS

## 2024-05-02 MED ORDER — SODIUM CHLORIDE 0.9 % IV SOLN: 250.0000 mL | INTRAVENOUS | Status: DC | PRN

## 2024-05-02 MED ORDER — HEPARIN SODIUM (PORCINE) 1000 UNIT/ML IJ SOLN
INTRAMUSCULAR | Status: DC | PRN
  Administered 2024-05-02 (×2): 4000 [IU] via INTRAVENOUS

## 2024-05-02 MED ORDER — HEPARIN SODIUM (PORCINE) 1000 UNIT/ML IJ SOLN
INTRAMUSCULAR | Status: AC
  Filled 2024-05-02: qty 10

## 2024-05-02 MED ORDER — LABETALOL HCL 5 MG/ML IV SOLN: 10.0000 mg | INTRAVENOUS | Status: AC | PRN

## 2024-05-02 MED ORDER — SODIUM CHLORIDE 0.9 % IV SOLN: INTRAVENOUS | Status: AC

## 2024-05-02 MED ORDER — TICAGRELOR 90 MG PO TABS
ORAL_TABLET | ORAL | Status: AC
  Filled 2024-05-02: qty 2

## 2024-05-02 MED ORDER — FENTANYL CITRATE (PF) 100 MCG/2ML IJ SOLN
INTRAMUSCULAR | Status: AC
  Filled 2024-05-02: qty 2

## 2024-05-02 MED ORDER — ONDANSETRON HCL 4 MG/2ML IJ SOLN: 4.0000 mg | Freq: Four times a day (QID) | INTRAMUSCULAR | Status: DC | PRN

## 2024-05-02 MED ORDER — HEPARIN (PORCINE) IN NACL 1000-0.9 UT/500ML-% IV SOLN
INTRAVENOUS | Status: DC | PRN
  Administered 2024-05-02 (×2): 500 mL

## 2024-05-02 MED ORDER — MIDAZOLAM HCL 2 MG/2ML IJ SOLN
INTRAMUSCULAR | Status: DC | PRN
  Administered 2024-05-02 (×2): 1 mg via INTRAVENOUS

## 2024-05-02 MED ORDER — HEPARIN (PORCINE) IN NACL 2-0.9 UNITS/ML
INTRAMUSCULAR | Status: DC | PRN
  Administered 2024-05-02: 10 mL via INTRA_ARTERIAL

## 2024-05-02 MED ORDER — TICAGRELOR 90 MG PO TABS
ORAL_TABLET | ORAL | Status: DC | PRN
  Administered 2024-05-02: 180 mg via ORAL

## 2024-05-02 MED ORDER — LIDOCAINE HCL (PF) 1 % IJ SOLN
INTRAMUSCULAR | Status: AC
  Filled 2024-05-02: qty 30

## 2024-05-02 MED ORDER — TICAGRELOR 90 MG PO TABS
90.0000 mg | ORAL_TABLET | Freq: Two times a day (BID) | ORAL | Status: DC
Start: 2024-05-02 — End: 2024-05-03
  Administered 2024-05-02 – 2024-05-03 (×2): 90 mg via ORAL
  Filled 2024-05-02 (×2): qty 1

## 2024-05-02 MED ORDER — IOHEXOL 350 MG/ML SOLN
INTRAVENOUS | Status: DC | PRN
  Administered 2024-05-02: 95 mL

## 2024-05-02 MED ORDER — ROSUVASTATIN CALCIUM 5 MG PO TABS
10.0000 mg | ORAL_TABLET | Freq: Every day | ORAL | Status: DC
  Administered 2024-05-02 – 2024-05-03 (×2): 10 mg via ORAL
  Filled 2024-05-02 (×2): qty 2

## 2024-05-02 MED ORDER — SODIUM CHLORIDE 0.9% FLUSH
3.0000 mL | Freq: Two times a day (BID) | INTRAVENOUS | Status: DC
  Administered 2024-05-02 – 2024-05-03 (×2): 3 mL via INTRAVENOUS

## 2024-05-02 MED ORDER — LIDOCAINE HCL (PF) 1 % IJ SOLN
INTRAMUSCULAR | Status: DC | PRN
  Administered 2024-05-02: 2 mL

## 2024-05-02 MED ORDER — HYDRALAZINE HCL 20 MG/ML IJ SOLN: 10.0000 mg | INTRAMUSCULAR | Status: AC | PRN

## 2024-05-02 MED ORDER — MIDAZOLAM HCL 2 MG/2ML IJ SOLN
INTRAMUSCULAR | Status: AC
  Filled 2024-05-02: qty 2

## 2024-05-02 SURGICAL SUPPLY — 16 items
BALLOON EMERGE MR 2.5X12 (BALLOONS) IMPLANT
BALLOON EMERGE MR 2.5X15 (BALLOONS) IMPLANT
CATH INFINITI 5 FR JL3.5 (CATHETERS) IMPLANT
CATH INFINITI AMBI 5FR TG (CATHETERS) IMPLANT
CATH LAUNCHER 6FR EBU 3 (CATHETERS) IMPLANT
CATH VISTA GUIDE 6FR XBLD 3.5 (CATHETERS) IMPLANT
DEVICE RAD COMP TR BAND LRG (VASCULAR PRODUCTS) IMPLANT
GLIDESHEATH SLEND SS 6F .021 (SHEATH) IMPLANT
GUIDEWIRE INQWIRE 1.5J.035X260 (WIRE) IMPLANT
KIT ENCORE 26 ADVANTAGE (KITS) IMPLANT
KIT SYRINGE INJ CVI SPIKEX1 (MISCELLANEOUS) IMPLANT
PACK CARDIAC CATHETERIZATION (CUSTOM PROCEDURE TRAY) ×1 IMPLANT
SET ATX-X65L (MISCELLANEOUS) IMPLANT
SHEATH PROBE COVER 6X72 (BAG) IMPLANT
STENT SYNERGY XD 2.75X16 (Permanent Stent) IMPLANT
WIRE ASAHI PROWATER 180CM (WIRE) IMPLANT

## 2024-05-02 NOTE — Progress Notes (Addendum)
 [facility] Provider Screening/Physical Exam   Were all inclusion criteria met? [x] Yes [] No  Informed consent obtained before any study-related activities. Study-related activities are any  procedures that are carried out as part of the study, including activities to determine suitability  for the study.  2.  Age 62 years or above at the time of signing the informed consent.   3.  Hospitalizations for acute myocardial infarction with evidence of type 1 MIb,13 by invasive  angiography performed at site with PCI capabilities.  ? ST-segment elevation myocardial infarction with all the following: o Relevant symptoms suggestive of cardiac ischaemia within 12 hours before hospitalisation or during hospitalisation. o ECG-changes (in the absence of left ventricular hypertrophy or left bundle branch  block): ST-segment elevation at the J point in at least two contiguous leads  >=0.25 mV in men <40 years, >=0.2 mV in men >=40 years, or >=0.15 mV in women in  leads V2-V3; and/or >=0.1 mV in all other leads. or ? Non-ST-segment myocardial infarction with all the following: o Relevant symptoms suggestive of cardiac ischaemia within 24 hours before hospitalisation or during hospitalisation.  4. Rise and/or fall in cardiac troponin I or T with at least one value above the 99th percentile upper reference limit.  5. Possibility for randomisation as early as possible after invasive procedure, and latest within  36 hours of hospitalisation (time 0) for STEMI, and latest within 48 hours of hospitalisation  (time 0) for NSTEMI.   6. Presence of at least one of the following criteria (confirmed based on the participant's medical  records and/or medical history interview):  []  Any prior MI. [] Prior coronary revascularisation. [] Diabetes mellitus treated with glucose-lowering agent(s). [] Known CKD (eGFR >=15 and <60 mL/min/1.73 m2 [x] Prior ischaemic stroke. []  Known carotid disease or peripheral artery  disease in the lower extremities. [] Multivessel coronary artery disease (current/prior). [] For STEMI patients only: anterior MI at index AMI.   7. Angiographic signs supporting the clinical suspicion of a type 1 MI including but not limited to:  ? A flow-limiting stenosis not present at the site of a prior stent. ? A relevant stenosis with features suggesting a thrombus/plaque rupture such as luminal  irregularities, thrombus-looking appearance, or haziness. Type 1 MI excludes type 2 MI (including but not limited to significant gastrointestinal bleedings,  anaemia and sepsis), and type 4 MI (a-c: periprocedural (PCI) MI, stent thrombosis and in-stent  restenosis) and type 5 MI (periprocedural (CABG) MI). Interpretation of the above is at the  discretion of the investigator. c The documented presence of a coronary stent on the coronary angiogram performed for the current  (index) AMI is acceptable to define prior coronary revascularisation. d Defined as >=50% stenosis on 2 or more epicardial artery territories or the left main artery during a  prior cardiac catheterisation or cardiac catheterisation during the current AMI, or prior percutaneous  coronary intervention (PCI) and >=50% stenosis of at least 1 epicardial artery territory different from  the prior revascularised artery, or prior multivessel coronary bypass grafting. e The anterior ST-elevation myocardial infarction is documented during the index AMI by relevant  electrocardiographic changes such as, but not limited to, ST-elevations in V1-V4.   Were all exclusion criteria met? [x] Yes [] No  General 1. Known or suspected hypersensitivity to study intervention or related products.  2. Previous participation in this study. Participation is defined as randomisation.  3. Female who is pregnant, breast-feeding or intends to become pregnant or is of childbearing  potential and not using an adequate and  highly effective contraceptive  method (adequate  contraceptive measures as required by local regulation or practice), as defined in Appendix 4,  Section 10.4.2.  4. Participation (i.e., signed informed consent) in any other interventional clinical study of an  approved or non-approved investigational medicinal product within the past 30 days. 5. Any disorder, which in the investigator's opinion might jeopardise participant's safety or  compliance with the protocol. 6. Absolute neutrophil count <2109/L.  7. Platelet count <120109/L.  8. ALT >8 x ULN.  a Medical conditions - cardiac and risk factors 9. Use of fibrinolytic therapy for treatment of the current AMI.  10. Chronic heart failure classified as being in [place]  Heart Association (NYHA) Class IV.  11. Ongoing haemodynamic instability defined as any of the following:  ? Killip Class III or IV.  ? Sustained and/or symptomatic hypotension (systolic blood pressure <90 mmHg).  12. Severe kidney impairment defined as any of the following:   ? Previous or current eGFR<15 mL/min/1.73 m2  . ? Chronic haemodialysis or peritoneal dialysis.  13. Severe hepatic disease defined as at least one of the following:  ? Previously known or current hepatic encephalopathy (clinical evaluation). ? Previously known or current ascites (clinical evaluation). ? Jaundice (clinical evaluation). ? Previous oesophageal/gastric variceal bleeding. ? Known hepatic cirrhosis. 14. Major cardiac surgical (including but not restricted to coronary artery bypass graft surgery  [CABG]), non-cardiac surgical, or major endoscopic procedure (thoracoscopic or laparoscopic) within the past 60 days or any major surgical procedure planned at the time of randomisation or as treatment for the current AMI (CABG). Deferred (staged) percutaneous  coronary intervention for a non-culprit vessel identified during the current AMI is allowed.  Medical conditions - infections 15. History of recurrent serious  infections (infections leading to hospitalisation or use of i.v.  antibiotics) within the past 12 months, at the discretion of the investigator.  16. Clinical evidence of, or suspicion of, active infection at the discretion of the investigator.  17. Known (acute or chronic) hepatitis B or hepatitis C.  18. History or evidence of untreated latent tuberculosis (TB) such as (but not limited to):  ? History or evidence of a positive TB test or chest X-ray compatible with latent TB; and TB  treatment initiated less than 28 days prior to randomisation. ? Participants with TB risk factors (see details in Section 8.2.6) unwilling to undergo TB  treatment if confirmed positive for latent TB based on central laboratory test at baseline  (visit 2). 19. Diagnosis of human immunodeficiency virus (HIV) and not receiving a stable antiretroviral  regimen, at the discretion of the investigator.  20. History of gastrointestinal perforation (Note: History of perforated appendicitis more than  62 years old is not exclusionary).  21. History of active diverticulitis within the past 5 years.  22. History of inflammatory bowel disease that has been clinically active within the past  12 months.  Medical conditions - other 23. Presence or history of malignant neoplasms or in situ carcinomas (other than basal or  squamous cell skin cancer, low risk prostate cancer, or in situ carcinomas of the cervix, or  carcinoma in situ/high grade prostatic intraepithelial neoplasia (PIN)) within the past 5 years.  24. History of bone marrow or solid organ transplant or anticipated to receive an organ transplant  during the study. Note: Patients no longer receiving immune suppressant therapy and who  are in full remission following bone marrow transplant can be included in the study.  25. Received a live or attenuated-live  vaccine product within 4 weeks of study intervention administration or expected to receive a live or  attenuated-live vaccine product during the  treatment period. (Note: Not-live and not attenuated-live vaccines are not exclusionary. For  guidance refer to Appendix 8, Section 10.8).  26. Use of preventive systemic antibiotics, systemic antivirals, or systemic antifungals. (Note:  "Systemic" is defined as oral or i.v. administered drugs that are absorbed into the  circulation. Antibiotics used to treat latent TB are exempted).  27. Use of systemic immunosuppressive drugs (both small molecules and biologics) or disease  modifying anti-rheumatic drugs (DMARDs including both biologic DMARDs like anti-TNFalpha and conventional DMARDs like methotrexate) or anticipated chronic use of such drugs  any time during the study. (Note: Use of otic, ophthalmic, inhaled, and topical corticosteroids  or local corticosteroid injections are not exclusionary. Furthermore, temporary systemic  immunosuppressive treatment of e.g., chronic obstructive pulmonary disease [COPD]  exacerbations is allowed).  28. Use of anti-IL-6 products or anticipated use of such drugs any time during the study. a Participants with increased levels of ALT <=8 x ULN are eligible at the discretion of the  investigator if the investigator considers the ALT elevations to be caused by the current AMI. In  case the elevation is considered related to other reasons than the current AMI, participants should be  excluded when ALT >2.5 x ULN.  b Endoscopic procedure for screening purposes such as gastroscopy and colonoscopy are allowed if  there is no suspicion of malignant disease driving the referral.   [id number] reviewed for Adverse Events: [x] Yes [] No  Were any Adverse Events identified: [] Yes [x] No   Research PE   Patient seen an examined prior to randomization.  Physical Exam: General appearance: alert and cooperative Neck: no adenopathy, no carotid bruit, no JVD, and supple, symmetrical, trachea midline Lungs: clear to  auscultation bilaterally Heart: regular rate and rhythm, S1, S2 normal, no murmur, click, rub or gallop Extremities: extremities normal, atraumatic, no cyanosis or edema Pulses: 2+ and symmetric Neurologic: Grossly normal  Killip Class:  [x] 1 [] 2[] 3 [] 4  NYHA:  [x] 1 [] 2[] 3 [] 4  AMI Type 1?: [x] Yes [] No   The patient was given the opportunity to ask any further questions about the study that were not addressed by the research nurse coordinators.

## 2024-05-02 NOTE — Progress Notes (Signed)
  Echocardiogram 2D Echocardiogram has been performed.  Tinnie FORBES Gosling RDCS 05/02/2024, 1:59 PM

## 2024-05-02 NOTE — Interval H&P Note (Signed)
 History and Physical Interval Note:  05/02/2024 7:27 AM  Kathy Stephens  has presented today for surgery, with the diagnosis of NSTEMI.  The various methods of treatment have been discussed with the patient and family. After consideration of risks, benefits and other options for treatment, the patient has consented to  Procedure(s): LEFT HEART CATH AND CORONARY ANGIOGRAPHY (N/A)  PERCUTANEOUS CORONARY INTERVENTION  as a surgical intervention.  The patient's history has been reviewed, patient examined, no change in status, stable for surgery.  I have reviewed the patient's chart and labs.  Questions were answered to the patient's satisfaction.    Cath Lab Visit (complete for each Cath Lab visit)  Clinical Evaluation Leading to the Procedure:   ACS: Yes.    Non-ACS:    Anginal Classification: CCS IV  Anti-ischemic medical therapy: Minimal Therapy (1 class of medications)  Non-Invasive Test Results: No non-invasive testing performed  Prior CABG: No previous CABG    Alm Clay

## 2024-05-02 NOTE — Progress Notes (Signed)
 PROGRESS NOTE   Kathy Stephens  FMW:[id number]    DOB: Apr 24, 1961    DOA: 05/01/2024  PCP: Chandra Toribio POUR, MD   I have briefly reviewed patients previous medical records in [facility].   Brief Hospital Course:  63 year old married female, independent, medical history significant for HTN, HLD, tobacco abuse (cigars), multiple TIAs in 2024, splenic infarct, vitamin D  deficiency, strong family history of CAD, presented to the ED with complaints of multiple episodes of chest pain in the week prior to admission that woke her up from sleep, radiation to her neck, nausea and some sweatiness.  She had an episode 2 days PTA that was more severe and more persistent, associated with decreased functional status, fatigue and dyspnea with mild activity such as walking her dog.  Troponins peaked to 360.  Cardiology consulted.  6/30, underwent LHC and DES to 99% subtotal occluded proximal to mid LAD.    Assessment & Plan:   NSTEMI Presented with new onset of chest pain consistent with MI, history as noted above. HS Troponin I elevated: 201 > 224 > 360 Cardiology consulted.  Treated with IV heparin, aspirin , no beta-blockers due to borderline bradycardia, initiated atorvastatin  80 mg daily and Imdur 6/30, underwent LHC and DES to 99% subtotal occluded proximal to mid LAD.  Continue aspirin  and Brilinta Patient seen just after cath.  Reports headache?  Related to NTG.  Prefers to go home tomorrow.  Essential hypertension Presented with BP of 176/89, likely worsened due to pain Currently mildly uncontrolled but yet to get her morning meds. Continue Imdur 30 mg daily (monitor headaches), lisinopril  10 Mg daily.  Hyperlipidemia LDL 139 Continue atorvastatin  80 mg daily.  Check baseline LFTs. Lipoprotein A results are pending.  Tobacco abuse Cessation counseled.  Continue nicotine patch  GERD Continue PPI   Body mass index is 31.28 kg/m.   DVT prophylaxis:   SCDs   Code Status: Full  Code:  Family Communication: None at bedside Disposition:  Status is: Inpatient Remains inpatient appropriate because: Immediate post cath and coronary stent.  Pending cardiology follow-up and DC clearance.  Patient however prefers to discharge tomorrow.     Consultants:   Cardiology  Procedures:   As above  Subjective:  Seen after she just returned from Cath Lab and stent.  Feels that the procedure was more painful than she expected.  Reports headache.  Nurse at bedside getting ready to give Tylenol .  No chest pain or dyspnea.  Lives independently with her spouse and dog.  Last smoked a cigar on Friday.  Smokes up to 1 to 3 cigars/day.  Objective:   Vitals:   05/02/24 0830 05/02/24 0835 05/02/24 0840 05/02/24 0904  BP: (!) 159/86 (!) 165/81 (!) 161/84 (!) 150/78  Pulse: 82 76 67   Resp: 17 14 (!) 26   Temp:    97.9 F (36.6 C)  TempSrc:    Oral  SpO2: 96% 97% 98% 98%  Weight:      Height:        General exam: Young female, moderately built and nourished lying comfortably supine in bed. Respiratory system: Clear to auscultation. Respiratory effort normal. Cardiovascular system: S1 & S2 heard, RRR. No JVD, murmurs, rubs, gallops or clicks. No pedal edema.  Telemetry personally reviewed: Sinus rhythm. Gastrointestinal system: Abdomen is nondistended, soft and nontender. No organomegaly or masses felt. Normal bowel sounds heard. Central nervous system: Alert and oriented. No focal neurological deficits. Extremities: Symmetric 5 x 5 power. Skin: No  rashes, lesions or ulcers Psychiatry: Judgement and insight appear normal. Mood & affect appropriate.     Data Reviewed:   I have personally reviewed following labs and imaging studies   CBC: Recent Labs  Lab 05/01/24 0129 05/01/24 1351 05/02/24 0520  WBC 7.7 6.3 5.7  HGB 13.5 12.6 12.2  HCT 41.5 39.1 37.6  MCV 92.8 94.4 91.5  PLT 169 164 162    Basic Metabolic Panel: Recent Labs  Lab 05/01/24 0129 05/01/24 1351  05/02/24 0520  NA 139  --  138  K 4.4  --  4.0  CL 103  --  105  CO2 27  --  24  GLUCOSE 112*  --  97  BUN 17  --  16  CREATININE 0.93 1.06* 0.86  CALCIUM  9.5  --  9.1    Liver Function Tests: No results for input(s): AST, ALT, ALKPHOS, BILITOT, PROT, ALBUMIN in the last 168 hours.  CBG: No results for input(s): GLUCAP in the last 168 hours.  Microbiology Studies:  No results found for this or any previous visit (from the past 240 hours).  Radiology Studies:  CARDIAC CATHETERIZATION Result Date: 05/02/2024 Images from the original result were not included.   Prox LAD to Mid LAD lesion is 99% stenosed with 90% stenosed side branch in 1st Diag.   A drug-eluting stent was successfully placed using a STENT SYNERGY XD [id number] => deployed to 3.0 mm. Post intervention, there is a 0% residual stenosis.  TIMI-3 flow maintained   Post intervention, the small side branch remained stable with 90% residual stenosis.  (Not big enough for PTCA)   Mid LAD lesion is 40% stenosed. Dist LAD lesion is 45% stenosed.   ----------------------------------   The left ventricular ejection fraction is 50-55% by visual estimate.   There is no aortic valve stenosis. 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 Preserved/normal LVEF with normal LVEDP RECOMMENDATIONS   In the absence of any other complications or medical issues, we expect the patient to be ready for discharge from an interventional cardiology perspective on 05/02/2024.   Defer timing of discharge to rounding MD. May need time for additional titration of GDMT for CAD.   Recommend uninterrupted dual antiplatelet therapy with Aspirin  81mg  daily and Ticagrelor 90mg  twice daily for a minimum of 12 months (ACS-Class I recommendation).   After 1 year, would consider  switching to Thienopyridine monotherapy that would be interruptible.  (Maintenance dose Brilinta 60 mg twice daily or Plavix  75 mg daily) Alm Clay, MD Alm MICAEL Clay, MD, MS Alm Clay, M.D., M.S. Interventional Cardiologist [facility] Pager # [phone]   DG Chest 2 View Result Date: 05/01/2024 CLINICAL DATA:  Chest pain for 2 days EXAM: CHEST - 2 VIEW COMPARISON:  Radiographs 12/15/2013 FINDINGS: The heart size and mediastinal contours are within normal limits. Both lungs are clear. The visualized skeletal structures are unremarkable. IMPRESSION: No active cardiopulmonary disease. Electronically Signed   By: Norman Gatlin M.D.   On: 05/01/2024 01:52    Scheduled Meds:    aspirin  EC  81 mg Oral Daily   atorvastatin   80 mg Oral Daily   famotidine  20 mg Oral BID   isosorbide mononitrate  30 mg Oral Daily   lisinopril   10 mg Oral Daily  nicotine  14 mg Transdermal Daily   nitroGLYCERIN        sodium chloride  flush  3 mL Intravenous Q12H   ticagrelor  90 mg Oral BID    Continuous Infusions:    sodium chloride  40 mL/hr at 05/02/24 0909   sodium chloride        LOS: 1 day     Trenda Mar, MD,  FACP, [facility], [facility], [facility]   [facility] [place]      To contact the attending provider between 7A-7P or the covering provider during after hours 7P-7A, please log into the web site [email or web address] and access using universal [place] password for that web site. If you do not have the password, please call the hospital operator.  05/02/2024, 9:12 AM

## 2024-05-02 NOTE — Telephone Encounter (Signed)
 Patient Product/process development scientist completed.    The patient is insured through [facility]. Patient has [employer], may use a copay card, and/or apply for patient assistance if available.    Ran test claim for ticagrelor (Brilinta) 90 mg and the current 30 day co-pay is $50.00.   This test claim was processed through [facility]- copay amounts may vary at other pharmacies due to pharmacy/plan contracts, or as the patient moves through the different stages of their insurance plan.     Reyes Sharps, CPHT Pharmacy Technician III Certified Patient Advocate [facility] Patient Advocate Team Direct Number: [phone]  Fax: [phone]

## 2024-05-02 NOTE — Telephone Encounter (Signed)
 Copied from CRM [id number]. Topic: General - Other >> May 02, 2024 12:04 PM Charlet HERO wrote: Reason for CRM: Patient is calling to make sure that dr. Chandra that she has been admitted into the hospital she had 3 heart attacks .

## 2024-05-02 NOTE — Progress Notes (Addendum)
 Rounding Note   Patient Name: Kathy Stephens Date of Encounter: 05/02/2024  [facility] Cardiologist: Newman JINNY Lawrence, MD   Subjective Patient is doing okay after cardiac catheterization this morning.  She is chest pain-free.  She does have a headache.   Patient shares she has not been taking Lipitor at home, reported myalgias.  She has been trying to treat her cholesterol holistically with bergamot tea.  She does have a tobacco use history, though has not used any tobacco products since Friday without the use of nicotine replacement.  She is feeling very motivated to stop using tobacco products.  Denies withdrawal symptoms.   Scheduled Meds:  aspirin  EC  81 mg Oral Daily   atorvastatin   80 mg Oral Daily   famotidine  20 mg Oral BID   isosorbide mononitrate  30 mg Oral Daily   lisinopril   10 mg Oral Daily   nicotine  14 mg Transdermal Daily   nitroGLYCERIN        sodium chloride  flush  3 mL Intravenous Q12H   ticagrelor  90 mg Oral BID   Continuous Infusions:  sodium chloride  40 mL/hr at 05/02/24 0909   sodium chloride      PRN Meds: sodium chloride , acetaminophen , hydrALAZINE, labetalol, nitroGLYCERIN , nitroGLYCERIN , ondansetron  (ZOFRAN ) IV, sodium chloride  flush   Vital Signs  Vitals:   05/02/24 0830 05/02/24 0835 05/02/24 0840 05/02/24 0904  BP: (!) 159/86 (!) 165/81 (!) 161/84 (!) 150/78  Pulse: 82 76 67   Resp: 17 14 (!) 26   Temp:    97.9 F (36.6 C)  TempSrc:    Oral  SpO2: 96% 97% 98% 98%  Weight:      Height:        Intake/Output Summary (Last 24 hours) at 05/02/2024 1116 Last data filed at 05/02/2024 0611 Gross per 24 hour  Intake 561.84 ml  Output --  Net 561.84 ml      05/01/2024    8:18 PM 05/01/2024    1:20 AM 03/21/2024    3:49 PM  Last 3 Weights  Weight (lbs) 171 lb 171 lb 1.2 oz 171 lb  Weight (kg) 77.565 kg 77.6 kg 77.565 kg      Telemetry Sinus heart rate 50s- Personally Reviewed  Physical Exam GEN: No acute distress.    Neck: No JVD Cardiac: Regular rhythm, bradycardic rate, no murmurs, rubs, or gallops.  Distal pulses 1+ bilaterally.  Right radial site with TR band in place, mild ecchymosis.  Respiratory: Clear to auscultation bilaterally. MS: No edema; No deformity. Neuro:  Nonfocal  Psych: Normal affect   Labs High Sensitivity Troponin:   Recent Labs  Lab 05/01/24 0129 05/01/24 0406 05/01/24 1117  TROPONINIHS 201* 224* 360*     Chemistry Recent Labs  Lab 05/01/24 0129 05/01/24 1351 05/02/24 0520  NA 139  --  138  K 4.4  --  4.0  CL 103  --  105  CO2 27  --  24  GLUCOSE 112*  --  97  BUN 17  --  16  CREATININE 0.93 1.06* 0.86  CALCIUM  9.5  --  9.1  GFRNONAA >60 60* >60  ANIONGAP 9  --  9    Lipids  Recent Labs  Lab 05/02/24 0520  CHOL 220*  TRIG 114  HDL 58  LDLCALC 139*  CHOLHDL 3.8    Hematology Recent Labs  Lab 05/01/24 0129 05/01/24 1351 05/02/24 0520  WBC 7.7 6.3 5.7  RBC 4.47 4.14 4.11  HGB 13.5  12.6 12.2  HCT 41.5 39.1 37.6  MCV 92.8 94.4 91.5  MCH 30.2 30.4 29.7  MCHC 32.5 32.2 32.4  RDW 12.5 12.6 12.3  PLT 169 164 162   Thyroid No results for input(s): TSH, FREET4 in the last 168 hours.  BNPNo results for input(s): BNP, PROBNP in the last 168 hours.  DDimer No results for input(s): DDIMER in the last 168 hours.   Radiology  CARDIAC CATHETERIZATION Result Date: 05/02/2024 Images from the original result were not included.   Prox LAD to Mid LAD lesion is 99% stenosed with 90% stenosed side branch in 1st Diag.   A drug-eluting stent was successfully placed using a STENT SYNERGY XD 2.75X16 => deployed to 3.0 mm. Post intervention, there is a 0% residual stenosis.  TIMI-3 flow maintained   Post intervention, the small side branch remained stable with 90% residual stenosis.  (Not big enough for PTCA)   Mid LAD lesion is 40% stenosed. Dist LAD lesion is 45% stenosed.   ----------------------------------   The left ventricular ejection fraction is  50-55% by visual estimate.   There is no aortic valve stenosis. 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 Preserved/normal LVEF with normal LVEDP RECOMMENDATIONS   In the absence of any other complications or medical issues, we expect the patient to be ready for discharge from an interventional cardiology perspective on 05/02/2024.   Defer timing of discharge to rounding MD. May need time for additional titration of GDMT for CAD.   Recommend uninterrupted dual antiplatelet therapy with Aspirin  81mg  daily and Ticagrelor 90mg  twice daily for a minimum of 12 months (ACS-Class I recommendation).   After 1 year, would consider switching to Thienopyridine monotherapy that would be interruptible.  (Maintenance dose Brilinta 60 mg twice daily or Plavix  75 mg daily) Alm Clay, MD Alm MICAEL Clay, MD, MS Alm Clay, M.D., M.S. Interventional Cardiologist [facility] Pager # [phone]   DG Chest 2 View Result Date: 05/01/2024 CLINICAL DATA:  Chest pain for 2 days EXAM: CHEST - 2 VIEW COMPARISON:  Radiographs 12/15/2013 FINDINGS: The heart size and mediastinal contours are within normal limits. Both lungs are clear. The visualized skeletal structures are unremarkable. IMPRESSION: No active cardiopulmonary disease. Electronically Signed   By: Norman Gatlin M.D.   On: 05/01/2024 01:52    Cardiac Studies LHC 05/02/2024    Prox LAD to Mid LAD lesion is 99% stenosed with 90% stenosed side branch in 1st Diag.   A drug-eluting stent was successfully placed using a STENT SYNERGY XD 2.75X16 => deployed to 3.0 mm. Post intervention, there is a 0% residual stenosis.  TIMI-3 flow maintained   Post intervention, the small side branch remained stable with 90% residual stenosis.  (Not big enough for PTCA)   Mid LAD lesion is  40% stenosed. Dist LAD lesion is 45% stenosed.   ----------------------------------   The left ventricular ejection fraction is 50-55% by visual estimate.   There is no aortic valve stenosis.   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 Preserved/normal LVEF with normal LVEDP   Patient Profile   63 y.o. female with a past medical history of hypertension, hyperlipidemia, tobacco use, multiple TIAs in 2022, and splenic infarct presented to the ED on 05/01/2024 with chest pain.  Assessment & Plan  NSTEMI In 2022 patient was having multiple TIAs, and she was referred to cardiology, workup was unremarkable with a negative stress test. Presented to ED 05/01/2024 with chest pain that awoke her from her sleep, and it had been waxing and waning prior to arrival for 48 hours.  Associated nausea, diaphoresis and neck pain.   Troponin 201-> 224-> 368 CXR was unremarkable Initial ECG: Sinus rhythm with TWI V1-V2, VR 61 ECG after procedure: Similar to above with new T wave inversion in V3  Patient was taken to the cardiac Cath Lab 05/02/2024 which showed proximal LAD to mid LAD lesion with 99% stenosis and 90% stenosed to the first diagonal.  A DES was placed to the proximal and mid LAD.  The first diagonal was not large enough for PTCA, remains 90% stenosis.   During interview patient was experiencing a headache that she woke up to rated a 4 out of 10.  She was given her first dose of Imdur last night.  She denies chest pain.  Patient shared that she had been noting shortness of breath on exertion that has been inhibiting her ability to exercise prior to ED presentation.  Denied chest pain on exertion.   Echocardiogram  pending Continue ASA 81 mg Continue Brilinta 90mg  twice daily Continue sublingual nitroglycerin  0.4 mg as needed for chest pain  Discontinue Imdur 30 mg due to headache   Hypertension BP: 150/78 Patient reports at home her blood pressure is well-controlled, though prior to presentation to ED she noted that her blood pressure had been elevated with systolics in the 200s.  Highest recording this admission 188/85.  Could be related to ACS/pain.  Will see if her blood pressure improves as her headache subsides now that she is also chest pain-free.  If not we will increase her lisinopril  prior to discharge. Continue lisinopril  10 mg  Hyperlipidemia Lipid panel 05/02/2024 LDL 139      HDL 58 LP(a) ordered for tomorrow morning  Patient has not been taking a statin.  She experienced myalgias with Lipitor.  She spoke with her PCP who felt it was okay to stop her statin.  She has been pursuing holistic/natural therapies for cholesterol-lowering including bergamot tea  Stop Lipitor 80 mg Start Crestor 10 mg  Tobacco use Patient typically smokes about 2 filtered cigars a day.  She has had use no tobacco products since Friday.  She has not been using nicotine replacement therapy though ordered She is highly motivated to stop using tobacco products.  Denies withdrawal symptoms. Would recommend still prescribing nicotine replacement therapy at discharge as been shown to help improve tobacco cessation.    For questions or updates, please contact [facility] Please consult [email or web address] for contact info under     Signed, Leontine LOISE Salen, PA-C  05/02/2024, 11:16 AM    I have personally seen  and examined this patient. I agree with the assessment and plan as outlined above.  Pt doing well post cath. Mild headache.  Right wrist without bleeding. TR band in place  Will monitor overnight.  Stop Imdur.  Discharge home tomorrow.   Lonni Verlin COME, [facility] 05/02/2024  11:18 AM

## 2024-05-03 ENCOUNTER — Other Ambulatory Visit (HOSPITAL_COMMUNITY): Payer: Self-pay

## 2024-05-03 ENCOUNTER — Encounter (HOSPITAL_COMMUNITY): Payer: Self-pay | Admitting: Cardiology

## 2024-05-03 DIAGNOSIS — E785 Hyperlipidemia, unspecified: Secondary | ICD-10-CM | POA: Diagnosis not present

## 2024-05-03 DIAGNOSIS — I10 Essential (primary) hypertension: Secondary | ICD-10-CM | POA: Diagnosis not present

## 2024-05-03 DIAGNOSIS — I214 Non-ST elevation (NSTEMI) myocardial infarction: Secondary | ICD-10-CM | POA: Diagnosis not present

## 2024-05-03 DIAGNOSIS — Z006 Encounter for examination for normal comparison and control in clinical research program: Secondary | ICD-10-CM

## 2024-05-03 LAB — CBC
HCT: 36.9 % (ref 36.0–46.0)
Hemoglobin: 12.1 g/dL (ref 12.0–15.0)
MCH: 29.6 pg (ref 26.0–34.0)
MCHC: 32.8 g/dL (ref 30.0–36.0)
MCV: 90.2 fL (ref 80.0–100.0)
Platelets: 155 10*3/uL (ref 150–400)
RBC: 4.09 MIL/uL (ref 3.87–5.11)
RDW: 12.3 % (ref 11.5–15.5)
WBC: 6.3 10*3/uL (ref 4.0–10.5)
nRBC: 0 % (ref 0.0–0.2)

## 2024-05-03 LAB — BASIC METABOLIC PANEL WITH GFR
Anion gap: 6 (ref 5–15)
BUN: 11 mg/dL (ref 8–23)
CO2: 29 mmol/L (ref 22–32)
Calcium: 9.3 mg/dL (ref 8.9–10.3)
Chloride: 105 mmol/L (ref 98–111)
Creatinine, Ser: 0.89 mg/dL (ref 0.44–1.00)
GFR, Estimated: >60 mL/min (ref >60)
Glucose, Bld: 97 mg/dL (ref 70–99)
Potassium: 3.8 mmol/L (ref 3.5–5.1)
Sodium: 140 mmol/L (ref 135–145)

## 2024-05-03 LAB — LIPOPROTEIN A (LPA): Lipoprotein (a): 28.9 nmol/L (ref <75.0)

## 2024-05-03 MED ORDER — LISINOPRIL 10 MG PO TABS
10.0000 mg | ORAL_TABLET | Freq: Once | ORAL | Status: AC
  Administered 2024-05-03: 10 mg via ORAL
  Filled 2024-05-03: qty 1

## 2024-05-03 MED ORDER — NICOTINE 14 MG/24HR TD PT24
14.0000 mg | MEDICATED_PATCH | Freq: Every day | TRANSDERMAL | 0 refills | Status: DC
  Filled 2024-05-03: qty 25, 25d supply, fill #0

## 2024-05-03 MED ORDER — LISINOPRIL 20 MG PO TABS: 20.0000 mg | ORAL_TABLET | Freq: Every day | ORAL | Status: DC

## 2024-05-03 MED ORDER — LISINOPRIL 10 MG PO TABS
20.0000 mg | ORAL_TABLET | Freq: Every day | ORAL | 2 refills | Status: DC
  Filled 2024-05-03: qty 60, 30d supply, fill #0

## 2024-05-03 MED ORDER — ROSUVASTATIN CALCIUM 10 MG PO TABS
10.0000 mg | ORAL_TABLET | Freq: Every day | ORAL | 0 refills | Status: DC
Start: 2024-05-04 — End: 2024-05-26
  Filled 2024-05-03: qty 30, 30d supply, fill #0

## 2024-05-03 MED ORDER — NITROGLYCERIN 0.4 MG SL SUBL
0.4000 mg | SUBLINGUAL_TABLET | SUBLINGUAL | 3 refills | Status: AC | PRN
  Filled 2024-05-03: qty 100, 30d supply, fill #0

## 2024-05-03 MED ORDER — TICAGRELOR 90 MG PO TABS
90.0000 mg | ORAL_TABLET | Freq: Two times a day (BID) | ORAL | 2 refills | Status: DC
  Filled 2024-05-03: qty 60, 30d supply, fill #0

## 2024-05-03 MED ORDER — STUDY - ARTEMIS - ZILTIVEKIMAB 15 MG/0.5 ML OR PLACEBO SQ INJECTION (PI-CHRISTOPHER)
30.0000 mg | INJECTION | Freq: Once | SUBCUTANEOUS | Status: AC
  Administered 2024-05-03: 30 mg via SUBCUTANEOUS
  Filled 2024-05-03: qty 1

## 2024-05-03 NOTE — Progress Notes (Signed)
 CARDIAC REHAB PHASE I   PRE:  Rate/Rhythm: 68 NSR  BP:  Sitting: 154/74      SpO2: 99 RA  MODE:  Ambulation: 470 ft    POST:  Rate/Rhythm: 82 NSR  BP:  Sitting: 175/87    154/98      SpO2: 99 RA  Pt ambulated with supervision in the hallway, pt experienced R hip pain but otherwise asymptomatic.   Pt was educated on stent card, stent location, Antiplatelet and ASA use, wt restrictions, no baths/daily wash-ups, s/s of infection, ex guidelines, s/s to stop exercising, NTG use and calling 911, heart healthy diet, risk factors and CRPII. Pt received MI book and materials on exercise, diet, and CRPII. Will refer to [facility].   Pt states she is interested in program.  Garen FORBES Candy  MS, ACSM-CEP 9:10 AM 05/03/2024    Service time is from 0820 to 0910.

## 2024-05-03 NOTE — Research (Signed)
 [facility] Informed Consent   Subject Name: Kathy Stephens  Subject met inclusion and exclusion criteria.  The informed consent form, study requirements and expectations were reviewed with the subject and questions and concerns were addressed prior to the signing of the consent form.  The subject verbalized understanding of the trial requirements.  The subject agreed to participate in the [facility] trial and signed the informed consent at 1615 on 30/Jun/2025.  The informed consent was obtained prior to performance of any protocol-specific procedures for the subject.  A copy of the signed informed consent was given to the subject and a copy was placed in the subject's medical record.   Rosaline BIRCH Anjani Feuerborn

## 2024-05-03 NOTE — Progress Notes (Signed)
 Cardiology Office Note   Date:  05/10/2024  ID:  Kathy Stephens, DOB 1960-12-27, MRN [id number] PCP: Chandra Toribio POUR, MD  [facility] Providers Cardiologist:  Newman JINNY Lawrence, MD  History of Present Illness Kathy Stephens is a 63 y.o. female with a past medical history of HTN, HLD, tobacco use, CAD, history of multiple TIAs, splenic infarct. Patient followed by Dr. Lawrence, presents today for a hospital follow up appointment after recent sntemi   Patient first established care with Dr. Lawrence in 2022 for evaluation of chest pain. Nuclear stress test in 03/2021 showed normal myocardial perfusion, normal EF at 56%. CT cardiac scoring in 04/2021 showed a coronary calcium  score of 0. Echocardiogram in 04/2022 showed EF 60-65%, no regional wall motion abnormalities, mild LVH, normal LV diastolic parameters, normal RV systolic function, no significant valvular abnormalities. Cardiac monitor in 06/2023 showed sinus rhythm, no atrial fibrillation, rare PACs and PVCs.   Recently admitted 6/29-7/1 with NSTEMI. Presented complaining of chest pain. hsTn [id number]. Underwent cath 6/30 that showed severe single-vessel CAD with 99% subtotal occlusion of the proximal-mid LAD. This was treated with DES. Echocardiogram showed EF 55% with possible wall motion abnormalities, normal RV systolic function, no significant valvular abnormalities. Discharged on ASA 81 mg daily, Brilinta  90 mg BID, lisinopril  20 mg daily, crestor  10 mg daily  Today, patient reports that she has been doing well since being seen in the hospital.  She denies recurrence of chest pain.  Denies shortness of breath.  She has been able to start walking her dogs again and is tolerating activity well.   right radial cath site is healing well.  She denies dizziness, syncope, near syncope.  She is tolerating her medications well.  Reports that she has been having occasional chills and hot flashes.  Was not having these prior to her heart  attack, wonders if they are related to her heart attack or medications    Studies Reviewed Cardiac Studies & Procedures   ______________________________________________________________________________________________ CARDIAC CATHETERIZATION  CARDIAC CATHETERIZATION 05/02/2024  Conclusion Images from the original result were not included.    Prox LAD to Mid LAD lesion is 99% stenosed with 90% stenosed side branch in 1st Diag.   A drug-eluting stent was successfully placed using a STENT SYNERGY XD 2.75X16 => deployed to 3.0 mm. Post intervention, there is a 0% residual stenosis.  TIMI-3 flow maintained   Post intervention, the small side branch remained stable with 90% residual stenosis.  (Not big enough for PTCA)   Mid LAD lesion is 40% stenosed. Dist LAD lesion is 45% stenosed.   ----------------------------------   The left ventricular ejection fraction is 50-55% by visual estimate.   There is no aortic valve stenosis.  Diagnostic  Dominance: Right     Intervention  POST-CATH-DIAGNOSIS Severe single-vessel CAD with 99% subtotal occlusion of the proximal to mid LAD treated successfully with a Synergy XD 3.75 mm x 16 mm stent deployed to 3.0 mm. Preserved/normal LVEF with normal LVEDP  RECOMMENDATIONS   In the absence of any other complications or medical issues, we expect the patient to be ready for discharge from an interventional cardiology perspective on 05/02/2024.   Defer timing of discharge to rounding MD. May need time for additional titration of GDMT for CAD.   Recommend uninterrupted dual antiplatelet therapy with Aspirin  81mg  daily and Ticagrelor  90mg  twice daily for a minimum of 12 months (ACS-Class I recommendation).   After 1 year, would consider switching to Thienopyridine monotherapy that would  be interruptible.  (Maintenance dose Brilinta  60 mg twice daily or Plavix  75 mg daily)    Alm Clay, MD     Alm MICAEL Clay, MD, MS Alm Clay, M.D.,  M.S. Interventional Cardiologist [facility] Pager # [phone]  Findings Coronary Findings Diagnostic  Dominance: Right  Left Main Vessel was injected. Vessel is normal in caliber.  Left Anterior Descending Prox LAD to Mid LAD lesion is 99% stenosed with 90% stenosed side branch in 1st Diag. Vessel is the culprit lesion. The lesion is type B2, located at the major branch, eccentric, thrombotic and ulcerative. Mid LAD lesion is 40% stenosed. Dist LAD lesion is 45% stenosed.  First Diagonal Branch Vessel is small in size.  First Septal Branch Vessel is small in size.  Second Diagonal Branch Vessel is small in size.  Left Circumflex Vessel is large. Vessel is angiographically normal.  First Obtuse Marginal Branch Vessel is small in size.  Second Obtuse Marginal Branch Vessel is small in size.  Left Posterior Atrioventricular Artery Vessel is small in size.  Right Coronary Artery Vessel was injected. Vessel is large. Vessel is angiographically normal.  Right Ventricular Branch Vessel is small in size.  First Right Posterolateral Branch Vessel is small in size.  Intervention  Prox LAD to Mid LAD lesion with side branch in 1st Diag Stent - Main Branch Lesion length:  15 mm. CATH LAUNCHER 6FR EBU 3 guide catheter was inserted. Lesion crossed with guidewire using a WIRE ASAHI PROWATER 180CM. Pre-stent angioplasty was performed using a BALLOON EMERGE MR 2.5X15. Maximum pressure:  10 atm. Inflation time: 20 sec. A drug-eluting stent was successfully placed using a STENT SYNERGY XD 2.75X16. Maximum pressure: 16 atm. Inflation time: 30 sec. Post-stent angioplasty was performed. Maximum pressure:  20 atm. Inflation time:  20 sec. Stent balloon, and high atm Post-Intervention Lesion Assessment The intervention was successful. Pre-interventional TIMI flow is 3. Post-intervention TIMI flow is 3. Treated lesion length:  16 mm. No complications occurred at this  lesion. There is a 0% residual stenosis in the main branch post intervention. There is a 90% residual stenosis in the side branch post intervention.   STRESS TESTS  PCV MYOCARDIAL PERFUSION WO LEXISCAN  03/25/2021  Narrative Exercise Sestamibi Stress Test 03/25/2021: Normal ECG stress. The patient exercised for 9 minutes and 0 seconds of a Bruce protocol, achieving approximately 10.16 METs. Exercise was terminated due to fatigue/weakness.  Normal BP response.Normal exercise capacity. Myocardial perfusion is normal. Overall LV systolic function is normal without regional wall motion abnormalities. Stress LV EF: 56%. No previous exam available for comparison. Low risk.   ECHOCARDIOGRAM  ECHOCARDIOGRAM COMPLETE 05/02/2024  Narrative ECHOCARDIOGRAM REPORT    Patient Name:   NEETI KNUDTSON Date of Exam: 05/02/2024 Medical Rec #:  [id number]      Height:       62.0 in Accession #:    [id number]     Weight:       171.0 lb Date of Birth:  01-19-1961     BSA:          1.789 m Patient Age:    62 years       BP:           155/72 mmHg Patient Gender: F              HR:           62 bpm. Exam Location:  Inpatient  Procedure: 2D Echo, Color Doppler and Cardiac Doppler (Both Spectral  and Color Flow Doppler were utilized during procedure).  Indications:    NSTEMI I21.4  History:        Patient has prior history of Echocardiogram examinations, most recent 04/03/2022.  Sonographer:    Tinnie Gosling RDCS Referring Phys: [phone] SUBRINA SUNDIL  IMPRESSIONS   1. Left ventricular ejection fraction, by estimation, is 55%. The left ventricle has low normal function. On limited views, there appears to be mild hypokinesis of the anteroseptal wall. Left ventricular diastolic parameters were normal. 2. Right ventricular systolic function is normal. The right ventricular size is normal. 3. The mitral valve is normal in structure. No evidence of mitral valve regurgitation. No evidence of mitral  stenosis. 4. The aortic valve is normal in structure. Aortic valve regurgitation is not visualized. No aortic stenosis is present. 5. The inferior vena cava is normal in size with greater than 50% respiratory variability, suggesting right atrial pressure of 3 mmHg.  FINDINGS Left Ventricle: Left ventricular ejection fraction, by estimation, is 50 to 55%. The left ventricle has low normal function. The left ventricle demonstrates regional wall motion abnormalities. The left ventricular internal cavity size was normal in size. There is no left ventricular hypertrophy. Left ventricular diastolic parameters were normal.  Right Ventricle: The right ventricular size is normal. No increase in right ventricular wall thickness. Right ventricular systolic function is normal.  Left Atrium: Left atrial size was normal in size.  Right Atrium: Right atrial size was normal in size.  Pericardium: There is no evidence of pericardial effusion.  Mitral Valve: The mitral valve is normal in structure. No evidence of mitral valve regurgitation. No evidence of mitral valve stenosis.  Tricuspid Valve: The tricuspid valve is normal in structure. Tricuspid valve regurgitation is not demonstrated. No evidence of tricuspid stenosis.  Aortic Valve: The aortic valve is normal in structure. Aortic valve regurgitation is not visualized. No aortic stenosis is present.  Pulmonic Valve: The pulmonic valve was normal in structure. Pulmonic valve regurgitation is not visualized. No evidence of pulmonic stenosis.  Aorta: The aortic root is normal in size and structure.  Venous: The inferior vena cava is normal in size with greater than 50% respiratory variability, suggesting right atrial pressure of 3 mmHg.  IAS/Shunts: No atrial level shunt detected by color flow Doppler.   LEFT VENTRICLE PLAX 2D LVIDd:         4.20 cm   Diastology LVIDs:         3.10 cm   LV e' medial:  10.10 cm/s LV PW:         1.00 cm   LV e'  lateral: 10.30 cm/s LV IVS:        0.90 cm LVOT diam:     2.00 cm LV SV:         74 LV SV Index:   41 LVOT Area:     3.14 cm   RIGHT VENTRICLE             IVC RV S prime:     14.60 cm/s  IVC diam: 1.40 cm TAPSE (M-mode): 1.9 cm  LEFT ATRIUM             Index        RIGHT ATRIUM          Index LA diam:        3.40 cm 1.90 cm/m   RA Area:     8.25 cm LA Vol (A2C):   32.7 ml 18.28 ml/m  RA Volume:  14.80 ml 8.27 ml/m LA Vol (A4C):   41.3 ml 23.09 ml/m LA Biplane Vol: 38.3 ml 21.41 ml/m AORTIC VALVE LVOT Vmax:   110.00 cm/s LVOT Vmean:  71.000 cm/s LVOT VTI:    0.234 m  AORTA Ao Root diam: 2.70 cm Ao Asc diam:  3.40 cm   SHUNTS Systemic VTI:  0.23 m Systemic Diam: 2.00 cm  Aditya Sabharwal Electronically signed by Ria Commander Signature Date/Time: 05/02/2024/4:54:00 PM    Final    MONITORS  CARDIAC EVENT MONITOR 06/30/2023  Narrative   Sinus rhythm   No atrial fibrillation   Rare PAC/PVC - benign       ______________________________________________________________________________________________       EKG Interpretation Date/Time:  Tuesday May 10 2024 14:38:47 EDT Ventricular Rate:  73 PR Interval:  144 QRS Duration:  72 QT Interval:  392 QTC Calculation: 431 R Axis:   56  Text Interpretation: Normal sinus rhythm Normal ECG When compared with ECG of 03-May-2024 05:52,  previous TWI in V3-V5 have resolved Confirmed by Vicci Sauer [phone]) on 05/10/2024 3:14:05 PM    Risk Assessment/Calculations           Physical Exam VS:  BP 120/72   Pulse 73   Ht 5' 2 (1.575 m)   Wt 168 lb 6.4 oz (76.4 kg)   LMP 11/03/2006   SpO2 99%   BMI 30.80 kg/m        Wt Readings from Last 3 Encounters:  05/10/24 168 lb 6.4 oz (76.4 kg)  05/09/24 169 lb 4 oz (76.8 kg)  05/01/24 171 lb (77.6 kg)    GEN: Well nourished, well developed in no acute distress. Sitting comfortably on the exam table  NECK: No JVD CARDIAC: RRR, no murmurs, rubs,  gallops. Radial pulses 2+ bilaterally  RESPIRATORY:  Clear to auscultation without rales, wheezing or rhonchi  ABDOMEN: Soft, non-tender, non-distended EXTREMITIES:  No edema; No deformity   ASSESSMENT AND PLAN  NSTEMI  - Admitted 6/29-7/1 with NTSEMI. hsTn 201>224>360  - Cath showed evere single-vessel CAD with 99% subtotal occlusion of the proximal-mid LAD. This was treated with DES. - She has been doing well since her admission. No chest pain or SOB. Right radial cath site healing well  - EKG today shows normal sinus rhythm with improvement in TWI in leads V3-V5  - Continue ASA 81 mg daily and Brilinta  90 mg BID for 12 months- note, patient is interested in undergoing hip surgery in the near future. Advised her that this will not be able to be done in the next 6 months as her DAPT cannot be interrupted. May be able to have after the 6 month mark if OK with her cardiologist  - Continue crestor  10 mg daily- she previously had myalgias on lipitor so was started on low dose crestor . She is tolerating this well. If she continues to tolerate it, plan to increase at appointment in 2 months   HTN  - BP well controlled. No dizziness  - Continue lisinopril  20 mg daily - K 3.8 and creatinine 0.89 on 7/1   HLD  - Lipid panel from 6/30 showed LDL 139, HDL 58, triglycerides 114, total cholesterol 220  - Started on crestor  10 mg daily during recent admission. Had previously had myalgias on lipitor, so started low dose.   - Plan to recheck lipids in 2 months and titrate crestor  as tolerated  - Continue crestor  10 mg daily    Hot flashes  - Patient has been having  chills and hot flashes since her admission - Ordered TSH, free T4, and BMP      Cardiac Rehabilitation Eligibility Assessment  The patient is ready to start cardiac rehabilitation from a cardiac standpoint.       Dispo: Follow up with me in 2 months   Signed, Rollo FABIENE Louder, PA-C

## 2024-05-03 NOTE — Progress Notes (Addendum)
 Rounding Note   Patient Name: Kathy Stephens Date of Encounter: 05/03/2024  [facility] Cardiologist: Newman JINNY Lawrence, MD   Subjective She is doing well today. Denies headache, chest pain, and shortness of breath. She does have chronic left shoulder pain.   Reports a slight cough with her lisinopril  however she likes this medication as she tolerates it well otherwise. She did not tolerate lisinopril -hydrochlorothiazide  due to lightheadedness/ fatigue.   Scheduled Meds:  aspirin  EC  81 mg Oral Daily   famotidine  20 mg Oral BID   lisinopril   10 mg Oral Daily   nicotine  14 mg Transdermal Daily   rosuvastatin  10 mg Oral Daily   sodium chloride  flush  3 mL Intravenous Q12H   ticagrelor  90 mg Oral BID   Continuous Infusions:  sodium chloride      PRN Meds: sodium chloride , acetaminophen , nitroGLYCERIN , ondansetron  (ZOFRAN ) IV, sodium chloride  flush   Vital Signs  Vitals:   05/02/24 1525 05/02/24 2025 05/03/24 0540 05/03/24 0732  BP: 113/65 (!) 141/82 (!) 159/77 (!) 154/74  Pulse: 67  67 68  Resp: 16 18 18 16   Temp: 97.8 F (36.6 C) 98.5 F (36.9 C) 98.5 F (36.9 C) 98.9 F (37.2 C)  TempSrc: Oral Oral Oral Oral  SpO2: 100% 100% 100% 98%  Weight:      Height:        Intake/Output Summary (Last 24 hours) at 05/03/2024 0845 Last data filed at 05/03/2024 0500 Gross per 24 hour  Intake 240 ml  Output 0 ml  Net 240 ml      05/01/2024    8:18 PM 05/01/2024    1:20 AM 03/21/2024    3:49 PM  Last 3 Weights  Weight (lbs) 171 lb 171 lb 1.2 oz 171 lb  Weight (kg) 77.565 kg 77.6 kg 77.565 kg      Telemetry Sinus with TWI HR 60s - Personally Reviewed  Physical Exam GEN: No acute distress.   Cardiac: RRR, no murmurs, rubs, or gallops.  Respiratory: Clear to auscultation bilaterally. MS: No edema; No deformity. RT radial site with mild ecchymosis and tenderness. Soft and compressible. Dressing clean and dry. Radial pulse 1+ Neuro:  Nonfocal  Psych: Normal  affect   Labs High Sensitivity Troponin:   Recent Labs  Lab 05/01/24 0129 05/01/24 0406 05/01/24 1117  TROPONINIHS 201* 224* 360*     Chemistry Recent Labs  Lab 05/01/24 0129 05/01/24 1351 05/02/24 0520 05/02/24 1100 05/03/24 0456  NA 139  --  138  --  140  K 4.4  --  4.0  --  3.8  CL 103  --  105  --  105  CO2 27  --  24  --  29  GLUCOSE 112*  --  97  --  97  BUN 17  --  16  --  11  CREATININE 0.93 1.06* 0.86  --  0.89  CALCIUM  9.5  --  9.1  --  9.3  PROT  --   --   --  6.2*  --   ALBUMIN  --   --   --  3.4*  --   AST  --   --   --  14*  --   ALT  --   --   --  10  --   ALKPHOS  --   --   --  62  --   BILITOT  --   --   --  0.9  --  GFRNONAA >60 60* >60  --  >60  ANIONGAP 9  --  9  --  6    Lipids  Recent Labs  Lab 05/02/24 0520  CHOL 220*  TRIG 114  HDL 58  LDLCALC 139*  CHOLHDL 3.8    Hematology Recent Labs  Lab 05/02/24 0520 05/02/24 1100 05/03/24 0456  WBC 5.7 6.4 6.3  RBC 4.11 4.10 4.09  HGB 12.2 12.2 12.1  HCT 37.6 37.2 36.9  MCV 91.5 90.7 90.2  MCH 29.7 29.8 29.6  MCHC 32.4 32.8 32.8  RDW 12.3 12.3 12.3  PLT 162 154 155   Thyroid No results for input(s): TSH, FREET4 in the last 168 hours.  BNPNo results for input(s): BNP, PROBNP in the last 168 hours.  DDimer No results for input(s): DDIMER in the last 168 hours.   Radiology  ECHOCARDIOGRAM COMPLETE Result Date: 05/02/2024    ECHOCARDIOGRAM REPORT   Patient Name:   Kathy Stephens Date of Exam: 05/02/2024 Medical Rec #:  [id number]      Height:       62.0 in Accession #:    [id number]     Weight:       171.0 lb Date of Birth:  1960/12/23     BSA:          1.789 m Patient Age:    62 years       BP:           155/72 mmHg Patient Gender: F              HR:           62 bpm. Exam Location:  Inpatient Procedure: 2D Echo, Color Doppler and Cardiac Doppler (Both Spectral and Color            Flow Doppler were utilized during procedure). Indications:    NSTEMI I21.4  History:        Patient  has prior history of Echocardiogram examinations, most                 recent 04/03/2022.  Sonographer:    Tinnie Gosling RDCS Referring Phys: [phone] SUBRINA SUNDIL IMPRESSIONS  1. Left ventricular ejection fraction, by estimation, is 55%. The left ventricle has low normal function. On limited views, there appears to be mild hypokinesis of the anteroseptal wall. Left ventricular diastolic parameters were normal.  2. Right ventricular systolic function is normal. The right ventricular size is normal.  3. The mitral valve is normal in structure. No evidence of mitral valve regurgitation. No evidence of mitral stenosis.  4. The aortic valve is normal in structure. Aortic valve regurgitation is not visualized. No aortic stenosis is present.  5. The inferior vena cava is normal in size with greater than 50% respiratory variability, suggesting right atrial pressure of 3 mmHg. FINDINGS  Left Ventricle: Left ventricular ejection fraction, by estimation, is 50 to 55%. The left ventricle has low normal function. The left ventricle demonstrates regional wall motion abnormalities. The left ventricular internal cavity size was normal in size. There is no left ventricular hypertrophy. Left ventricular diastolic parameters were normal. Right Ventricle: The right ventricular size is normal. No increase in right ventricular wall thickness. Right ventricular systolic function is normal. Left Atrium: Left atrial size was normal in size. Right Atrium: Right atrial size was normal in size. Pericardium: There is no evidence of pericardial effusion. Mitral Valve: The mitral valve is normal in structure. No evidence of mitral valve regurgitation. No evidence  of mitral valve stenosis. Tricuspid Valve: The tricuspid valve is normal in structure. Tricuspid valve regurgitation is not demonstrated. No evidence of tricuspid stenosis. Aortic Valve: The aortic valve is normal in structure. Aortic valve regurgitation is not visualized. No aortic  stenosis is present. Pulmonic Valve: The pulmonic valve was normal in structure. Pulmonic valve regurgitation is not visualized. No evidence of pulmonic stenosis. Aorta: The aortic root is normal in size and structure. Venous: The inferior vena cava is normal in size with greater than 50% respiratory variability, suggesting right atrial pressure of 3 mmHg. IAS/Shunts: No atrial level shunt detected by color flow Doppler.  LEFT VENTRICLE PLAX 2D LVIDd:         4.20 cm   Diastology LVIDs:         3.10 cm   LV e' medial:  10.10 cm/s LV PW:         1.00 cm   LV e' lateral: 10.30 cm/s LV IVS:        0.90 cm LVOT diam:     2.00 cm LV SV:         74 LV SV Index:   41 LVOT Area:     3.14 cm  RIGHT VENTRICLE             IVC RV S prime:     14.60 cm/s  IVC diam: 1.40 cm TAPSE (M-mode): 1.9 cm LEFT ATRIUM             Index        RIGHT ATRIUM          Index LA diam:        3.40 cm 1.90 cm/m   RA Area:     8.25 cm LA Vol (A2C):   32.7 ml 18.28 ml/m  RA Volume:   14.80 ml 8.27 ml/m LA Vol (A4C):   41.3 ml 23.09 ml/m LA Biplane Vol: 38.3 ml 21.41 ml/m  AORTIC VALVE LVOT Vmax:   110.00 cm/s LVOT Vmean:  71.000 cm/s LVOT VTI:    0.234 m  AORTA Ao Root diam: 2.70 cm Ao Asc diam:  3.40 cm  SHUNTS Systemic VTI:  0.23 m Systemic Diam: 2.00 cm Aditya Sabharwal Electronically signed by Ria Commander Signature Date/Time: 05/02/2024/4:54:00 PM    Final    CARDIAC CATHETERIZATION Result Date: 05/02/2024 Images from the original result were not included.   Prox LAD to Mid LAD lesion is 99% stenosed with 90% stenosed side branch in 1st Diag.   A drug-eluting stent was successfully placed using a STENT SYNERGY XD [id number] => deployed to 3.0 mm. Post intervention, there is a 0% residual stenosis.  TIMI-3 flow maintained   Post intervention, the small side branch remained stable with 90% residual stenosis.  (Not big enough for PTCA)   Mid LAD lesion is 40% stenosed. Dist LAD lesion is 45% stenosed.   ----------------------------------    The left ventricular ejection fraction is 50-55% by visual estimate.   There is no aortic valve stenosis. 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 Preserved/normal LVEF with normal LVEDP RECOMMENDATIONS   In the absence of any other complications or medical issues, we expect the patient to be ready for discharge from an interventional cardiology perspective on 05/02/2024.   Defer timing of discharge to  rounding MD. May need time for additional titration of GDMT for CAD.   Recommend uninterrupted dual antiplatelet therapy with Aspirin  81mg  daily and Ticagrelor 90mg  twice daily for a minimum of 12 months (ACS-Class I recommendation).   After 1 year, would consider switching to Thienopyridine monotherapy that would be interruptible.  (Maintenance dose Brilinta 60 mg twice daily or Plavix  75 mg daily) Alm Clay, MD Alm MICAEL Clay, MD, MS Alm Clay, M.D., M.S. Interventional Cardiologist [facility] Pager # [phone]   Patient Profile   63 y.o. female with a past medical history of hypertension, hyperlipidemia, tobacco use, multiple TIAs in 2022, and splenic infarct presented to the ED on 05/01/2024 with chest pain.   Assessment & Plan  NSTEMI In 2022 patient was having multiple TIAs, and she was referred to cardiology, workup was unremarkable with a negative stress test. Presented to ED 05/01/2024 with chest pain that awoke her from her sleep, and it had been waxing and waning prior to arrival for 48 hours.  Associated nausea, diaphoresis and neck pain.   Troponin 201-> 224-> 368 CXR was unremarkable Initial ECG: Sinus rhythm with TWI V1-V2, VR 61  Patient was taken to the cardiac Cath Lab 05/02/2024 which showed proximal LAD to mid LAD lesion with 99% stenosis and 90% stenosed to the first diagonal.   A DES was placed to the proximal and mid LAD.  The first diagonal was not large enough for PTCA, remains 90% stenosis.  Echocardiogram showed EF 55% with mild hypokinesis of the anterior septal wall.   On interview today she reports no chest pain. No headache since discontinuing imdur.    Continue ASA 81 mg Continue Brilinta 90mg  twice daily Continue sublingual nitroglycerin  0.4 mg as needed for chest pain   Hypertension BP: 154/74 Patient reports at home her blood pressure is well-controlled, though prior to presentation to ED she noted that her blood pressure had been elevated with systolics in the 200s.  Highest recording this admission 188/85.    She has not tolerated hydrochlorothiazide  in the past. She does experience a cough with lisinopril , however she would like to continue to use this medication. Increase lisinopril  10 mg to lisinopril  20 mg   Hyperlipidemia Lipid panel 05/02/2024 LDL 139      HDL 58 LP(a) 71.0 Continue Crestor 10 mg, would titrate outpatient as tolerated. Started low due to myalgia with Lipitor previously    Tobacco use Patient typically smoked two filtered cigars a day. She has not used any nicotine replacement therapy during this admission. Denies withdraw symptoms.  Patient is motivated to continue to abstain from using tobacco products.  [facility] will sign off.   The patient is ready for discharge today from a cardiac standpoint. Medication Recommendations:  continue DAPT therapy, increase lisinopril  to 20mg , and continue crestor 10mg   Other recommendations (labs, testing, etc):  BMP at follow-up appointment due to increase of lisinopril  Follow up as an outpatient: Heart care follow up appointment made for 7/8 For questions or updates, please contact [facility] Please consult [email or web address] for contact info under   Please add for DC instructions: PLEASE DO NOT MISS ANY DOSES OF YOUR BRILINTA!!!!! Also keep a log of you blood  pressures and bring back to your follow up appt. Please call the office with any questions.   Patients taking blood thinners should generally stay away from medicines like ibuprofen, Advil, Motrin, naproxen , and Aleve  due to risk of stomach bleeding. You may take Tylenol  as  directed or talk to your primary doctor about alternatives.  PLEASE ENSURE THAT YOU DO NOT RUN OUT OF YOUR BRILINTA. This medication is very important to remain on for at least one year. IF you have issues obtaining this medication due to cost please CALL the office 3-5 business days prior to running out in order to prevent missing doses of this medication.   Radial Site Care Refer to this sheet in the next few weeks. These instructions provide you with information on caring for yourself after your procedure. Your caregiver may also give you more specific instructions. Your treatment has been planned according to current medical practices, but problems sometimes occur. Call your caregiver if you have any problems or questions after your procedure. HOME CARE INSTRUCTIONS You may shower the day after the procedure. Remove the bandage (dressing) and gently wash the site with plain soap and water . Gently pat the site dry.  Do not apply powder or lotion to the site.  Do not submerge the affected site in water  for 3 to 5 days.  Inspect the site at least twice daily.  Do not flex or bend the affected arm for 24 hours.  No lifting over 5 pounds (2.3 kg) for 5 days after your procedure.  Do not drive home if you are discharged the same day of the procedure. Have someone else drive you.  You may drive 24 hours after the procedure unless otherwise instructed by your caregiver.  What to expect: Any bruising will usually fade within 1 to 2 weeks.  Blood that collects in the tissue (hematoma) may be painful to the touch. It should usually decrease in size and tenderness within 1 to 2 weeks.  SEEK IMMEDIATE MEDICAL CARE IF: You have  unusual pain at the radial site.  You have redness, warmth, swelling, or pain at the radial site.  You have drainage (other than a small amount of blood on the dressing).  You have chills.  You have a fever or persistent symptoms for more than 72 hours.  You have a fever and your symptoms suddenly get worse.  Your arm becomes pale, cool, tingly, or numb.  You have heavy bleeding from the site. Hold pressure on the site.      Signed, Leontine LOISE Salen, PA-C  05/03/2024, 8:45 AM    I have personally seen and examined this patient. I agree with the assessment and plan as outlined above.  She is doing well today post PCI of the LAD with a DES. Plan for at least 12 months of DAPT with ASA and Brilinta.  Right wrist is ok.  Labs reviewed.  Tele with sinus BP elevated.  Agree with increasing Lisinopril .  Headache resolved. Would not restart Imdur.   OK to discharge home today. We will arrange follow up in our office.   Lonni Cash, MD, [facility] 05/03/2024 10:20 AM

## 2024-05-03 NOTE — Research (Signed)
  ARTEMIS Screening/Randomization Visit (V1/V2)   Were all inclusion criteria met? [x] Yes [] No   Date/Time of onset of relevant ischemic symptoms per subject: 04/30/2024 @ 2300   Randomization What was the date of randomization?: 01/Jul/2025 What was the time of randomization?: 0858   3 sets of VS 1-2 mins apart: Subject resting for >5 mins before VS taken. (4th set of vitals taken if 1st & 2nd set are >10 mmHG systolic or diastolic): [x] Yes [] No Weight/Height   Physical Exam Was the physical examination performed? [x] Yes [] No Was the examination date the same as the visit date? [] Yes [x] No   Tobacco/Nicotine History: Pt states she smokes about 10 cigars per week for last 20 years    Hep B/C Central labs drawn: [x] Yes [] No    Central Lab assessments drawn: [x] Yes [] No   Hs-CRP, IL-6, Lipids, biochemistry/hematology, hbA1C, PK sampling, immunogenicity assessments   Was TB screening performed for subject? [x] Yes [] No [] N/A  Did subject have TB risk factors: [] Yes [x] No [] N/A Risk factor identified:  TB Central lab test in subjects with risk factors: [] Yes [] No [x] N/A   Pregnancy Test Was the sample collected? [] Yes [] No [x] N/A Subject underwent tubal ligation in 1998 Was the collection date the same as the visit date? [] Yes [] No Specimen Type: [] Serum [] Urine Collection Date: Collection Time: Pregnancy Test Result: [] Positive [] Negative [] Borderline [] Invalid   Study Drug Administration Was dose administered?  [x] Yes [] No  Both autoinjectors given in R anterior thigh at 0953 without complication. Both autoinjectors placed in sharps container.    EQ-5D-5L Assessment Completed? [x] Yes [] No Reason not done: [] Subject Forgot [] Subject too ill [] Subject refused [] Technical failure [] Other If Other, Specify Collection Date: 30/Jun/2025   Subject ID card given: [x] Yes [] No   Updated contact list: [x] Yes [] No   No current facility-administered medications  for this visit. No current outpatient medications on file.  Facility-Administered Medications Ordered in Other Visits:    0.9 %  sodium chloride  infusion, 250 mL, Intravenous, PRN, Anner Alm ORN, MD   acetaminophen  (TYLENOL ) tablet 650 mg, 650 mg, Oral, Q6H PRN, Anner Alm ORN, MD, 650 mg at 05/02/24 0907   aspirin  EC tablet 81 mg, 81 mg, Oral, Daily, Anner Alm ORN, MD, 81 mg at 05/03/24 0818   famotidine (PEPCID) tablet 20 mg, 20 mg, Oral, BID, Anner Alm ORN, MD, 20 mg at 05/03/24 0819   [START ON 05/04/2024] lisinopril  (ZESTRIL ) tablet 20 mg, 20 mg, Oral, Daily, Lockwood, Logan N, PA-C   nicotine (NICODERM CQ - dosed in mg/24 hours) patch 14 mg, 14 mg, Transdermal, Daily, Anner Alm ORN, MD   nitroGLYCERIN  (NITROSTAT ) SL tablet 0.4 mg, 0.4 mg, Sublingual, Q5 min PRN, Anner Alm ORN, MD   ondansetron  (ZOFRAN ) injection 4 mg, 4 mg, Intravenous, Q6H PRN, Anner Alm ORN, MD   rosuvastatin (CRESTOR) tablet 10 mg, 10 mg, Oral, Daily, Garrick Christians N, PA-C, 10 mg at 05/03/24 0818   sodium chloride  flush (NS) 0.9 % injection 3 mL, 3 mL, Intravenous, Q12H, Anner Alm ORN, MD, 3 mL at 05/03/24 0823   sodium chloride  flush (NS) 0.9 % injection 3 mL, 3 mL, Intravenous, PRN, Anner Alm ORN, MD   ticagrelor [facility]) tablet 90 mg, 90 mg, Oral, BID, Anner Alm ORN, MD, 90 mg at 05/03/24 [phone]

## 2024-05-03 NOTE — Discharge Instructions (Signed)

## 2024-05-03 NOTE — Discharge Summary (Signed)
 Physician Discharge Summary  SKYLAN GIFT FMW:[id number] DOB: 02-03-1961  PCP: Chandra Toribio POUR, MD  Admitted from: Home Discharged to: Home  Admit date: 05/01/2024 Discharge date: 05/03/2024  Recommendations for Outpatient Follow-up:    Follow-up Information     Chandra Toribio POUR, MD. Schedule an appointment as soon as possible for a visit in 1 week(s).   Specialty: Family Medicine Why: To be seen with repeat labs (CBC & BMP). Contact information: [address] [place] [address] [phone]                  Home Health: None    Equipment/Devices: None    Discharge Condition: Improved and stable   Code Status: Full Code Diet recommendation:  Discharge Diet Orders (From admission, onward)     Start     Ordered   05/03/24 0000  Diet - low sodium heart healthy        05/03/24 1152             Discharge Diagnoses:  Principal Problem:   NSTEMI (non-ST elevated myocardial infarction) [facility])   Brief Hospital Course:  63 year old married female, independent, medical history significant for HTN, HLD, tobacco abuse (cigars), multiple TIAs in 2024, splenic infarct, vitamin D  deficiency, strong family history of CAD, presented to the ED with complaints of multiple episodes of chest pain in the week prior to admission that woke her up from sleep, radiation to her neck, nausea and some sweatiness.  She had an episode 2 days PTA that was more severe and more persistent, associated with decreased functional status, fatigue and dyspnea with mild activity such as walking her dog.  Troponins peaked to 360.  Cardiology consulted.  6/30, underwent LHC and DES to 99% subtotal occluded proximal to mid LAD.      Assessment & Plan:    NSTEMI Presented with new onset of chest pain consistent with MI, history as noted above. HS Troponin I elevated: 201 > 224 > 360 Cardiology consulted.  Treated with IV heparin, aspirin , no beta-blockers due to borderline bradycardia,  initiated atorvastatin  80 mg daily and Imdur 6/30, underwent LHC and DES to 99% subtotal occluded proximal to mid LAD.  Continue aspirin  and Brilinta x 12 months Due to headache, Imdur discontinued.  Also cardiology changed atorvastatin  to rosuvastatin. Cardiology have seen and cleared her for discharge home.   Essential hypertension Blood pressure was elevated this morning up to 175/87.  Cardiology given additional dose of lisinopril  today to go up to 20 mg daily and continue same at discharge.   Hyperlipidemia LDL 139 Cardiology have changed atorvastatin  to rosuvastatin.  LFTs unremarkable.  Monitor LFTs periodically as outpatient. Lipoprotein A results are pending.   Tobacco abuse Cessation counseled.  Continue nicotine patch   GERD Continue PPI   Body mass index is 31.28 kg/m.  Obesity Complicates care.  Outpatient follow-up.    Consultants:   Cardiology   Procedures:   As above   Discharge Instructions  Discharge Instructions     Amb Referral to Cardiac Rehabilitation   Complete by: As directed    Diagnosis:  Coronary Stents NSTEMI     After initial evaluation and assessments completed: Virtual Based Care may be provided alone or in conjunction with Phase 2 Cardiac Rehab based on patient barriers.: Yes   Intensive Cardiac Rehabilitation (ICR) [facility] location only OR Traditional Cardiac Rehabilitation (TCR) *If criteria for ICR are not met will enroll in TCR ([facility] only): Yes   Call MD  for:  difficulty breathing, headache or visual disturbances   Complete by: As directed    Call MD for:  extreme fatigue   Complete by: As directed    Call MD for:  persistant dizziness or light-headedness   Complete by: As directed    Call MD for:  persistant nausea and vomiting   Complete by: As directed    Call MD for:  severe uncontrolled pain   Complete by: As directed    Call MD for:  temperature >100.4   Complete by: As directed    Diet - low sodium heart healthy   Complete  by: As directed    Increase activity slowly   Complete by: As directed         Medication List     TAKE these medications    acetaminophen  500 MG tablet Commonly known as: TYLENOL  Take 500-1,000 mg by mouth every 6 (six) hours as needed for moderate pain (pain score 4-6).   aspirin  EC 81 MG tablet Take 1 tablet (81 mg total) by mouth daily. Swallow whole.   diphenhydrAMINE 25 mg capsule Commonly known as: BENADRYL Take 25 mg by mouth at bedtime.   famotidine 20 MG tablet Commonly known as: PEPCID Take 20 mg by mouth daily.   lisinopril  10 MG tablet Commonly known as: ZESTRIL  Take 2 tablets (20 mg total) by mouth daily. What changed: how much to take   loperamide 2 MG tablet Commonly known as: IMODIUM A-D Take 2 mg by mouth daily.   nicotine 14 mg/24hr patch Commonly known as: NICODERM CQ - dosed in mg/24 hours Place 1 patch (14 mg total) onto the skin daily. Start taking on: May 04, 2024   nitroGLYCERIN  0.4 MG SL tablet Commonly known as: Nitrostat  Place 1 tablet (0.4 mg total) under the tongue every 5 (five) minutes as needed for chest pain for up to 3 doses.   rosuvastatin 10 MG tablet Commonly known as: CRESTOR Take 1 tablet (10 mg total) by mouth daily. Start taking on: May 04, 2024   ticagrelor 90 MG Tabs tablet Commonly known as: BRILINTA Take 1 tablet (90 mg total) by mouth 2 (two) times daily.       Allergies  Allergen Reactions   Other Other (See Comments)    Nut Meg: Throat Swelling/Burning    Azithromycin Rash and Other (See Comments)    Tingly in face and lips      Procedures/Studies: ECHOCARDIOGRAM COMPLETE Result Date: 05/02/2024    ECHOCARDIOGRAM REPORT   Patient Name:   Kathy Stephens Date of Exam: 05/02/2024 Medical Rec #:  [id number]      Height:       62.0 in Accession #:    [id number]     Weight:       171.0 lb Date of Birth:  03-20-1961     BSA:          1.789 m Patient Age:    63 years       BP:           155/72 mmHg Patient  Gender: F              HR:           62 bpm. Exam Location:  Inpatient Procedure: 2D Echo, Color Doppler and Cardiac Doppler (Both Spectral and Color            Flow Doppler were utilized during procedure). Indications:    NSTEMI I21.4  History:  Patient has prior history of Echocardiogram examinations, most                 recent 04/03/2022.  Sonographer:    Tinnie Gosling RDCS Referring Phys: [phone] SUBRINA SUNDIL IMPRESSIONS  1. Left ventricular ejection fraction, by estimation, is 55%. The left ventricle has low normal function. On limited views, there appears to be mild hypokinesis of the anteroseptal wall. Left ventricular diastolic parameters were normal.  2. Right ventricular systolic function is normal. The right ventricular size is normal.  3. The mitral valve is normal in structure. No evidence of mitral valve regurgitation. No evidence of mitral stenosis.  4. The aortic valve is normal in structure. Aortic valve regurgitation is not visualized. No aortic stenosis is present.  5. The inferior vena cava is normal in size with greater than 50% respiratory variability, suggesting right atrial pressure of 3 mmHg. FINDINGS  Left Ventricle: Left ventricular ejection fraction, by estimation, is 50 to 55%. The left ventricle has low normal function. The left ventricle demonstrates regional wall motion abnormalities. The left ventricular internal cavity size was normal in size. There is no left ventricular hypertrophy. Left ventricular diastolic parameters were normal. Right Ventricle: The right ventricular size is normal. No increase in right ventricular wall thickness. Right ventricular systolic function is normal. Left Atrium: Left atrial size was normal in size. Right Atrium: Right atrial size was normal in size. Pericardium: There is no evidence of pericardial effusion. Mitral Valve: The mitral valve is normal in structure. No evidence of mitral valve regurgitation. No evidence of mitral valve stenosis.  Tricuspid Valve: The tricuspid valve is normal in structure. Tricuspid valve regurgitation is not demonstrated. No evidence of tricuspid stenosis. Aortic Valve: The aortic valve is normal in structure. Aortic valve regurgitation is not visualized. No aortic stenosis is present. Pulmonic Valve: The pulmonic valve was normal in structure. Pulmonic valve regurgitation is not visualized. No evidence of pulmonic stenosis. Aorta: The aortic root is normal in size and structure. Venous: The inferior vena cava is normal in size with greater than 50% respiratory variability, suggesting right atrial pressure of 3 mmHg. IAS/Shunts: No atrial level shunt detected by color flow Doppler.  LEFT VENTRICLE PLAX 2D LVIDd:         4.20 cm   Diastology LVIDs:         3.10 cm   LV e' medial:  10.10 cm/s LV PW:         1.00 cm   LV e' lateral: 10.30 cm/s LV IVS:        0.90 cm LVOT diam:     2.00 cm LV SV:         74 LV SV Index:   41 LVOT Area:     3.14 cm  RIGHT VENTRICLE             IVC RV S prime:     14.60 cm/s  IVC diam: 1.40 cm TAPSE (M-mode): 1.9 cm LEFT ATRIUM             Index        RIGHT ATRIUM          Index LA diam:        3.40 cm 1.90 cm/m   RA Area:     8.25 cm LA Vol (A2C):   32.7 ml 18.28 ml/m  RA Volume:   14.80 ml 8.27 ml/m LA Vol (A4C):   41.3 ml 23.09 ml/m LA Biplane Vol: 38.3 ml 21.41  ml/m  AORTIC VALVE LVOT Vmax:   110.00 cm/s LVOT Vmean:  71.000 cm/s LVOT VTI:    0.234 m  AORTA Ao Root diam: 2.70 cm Ao Asc diam:  3.40 cm  SHUNTS Systemic VTI:  0.23 m Systemic Diam: 2.00 cm Aditya Sabharwal Electronically signed by Ria Commander Signature Date/Time: 05/02/2024/4:54:00 PM    Final    CARDIAC CATHETERIZATION Result Date: 05/02/2024 Images from the original result were not included.   Prox LAD to Mid LAD lesion is 99% stenosed with 90% stenosed side branch in 1st Diag.   A drug-eluting stent was successfully placed using a STENT SYNERGY XD 2.75X16 => deployed to 3.0 mm. Post intervention, there is a 0%  residual stenosis.  TIMI-3 flow maintained   Post intervention, the small side branch remained stable with 90% residual stenosis.  (Not big enough for PTCA)   Mid LAD lesion is 40% stenosed. Dist LAD lesion is 45% stenosed.   ----------------------------------   The left ventricular ejection fraction is 50-55% by visual estimate.   There is no aortic valve stenosis. 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 Preserved/normal LVEF with normal LVEDP RECOMMENDATIONS   In the absence of any other complications or medical issues, we expect the patient to be ready for discharge from an interventional cardiology perspective on 05/02/2024.   Defer timing of discharge to rounding MD. May need time for additional titration of GDMT for CAD.   Recommend uninterrupted dual antiplatelet therapy with Aspirin  81mg  daily and Ticagrelor 90mg  twice daily for a minimum of 12 months (ACS-Class I recommendation).   After 1 year, would consider switching to Thienopyridine monotherapy that would be interruptible.  (Maintenance dose Brilinta 60 mg twice daily or Plavix  75 mg daily) Alm Clay, MD Alm MICAEL Clay, MD, MS Alm Clay, M.D., M.S. Interventional Cardiologist [facility] Pager # [phone]   DG Chest 2 View Result Date: 05/01/2024 CLINICAL DATA:  Chest pain for 2 days EXAM: CHEST - 2 VIEW COMPARISON:  Radiographs 12/15/2013 FINDINGS: The heart size and mediastinal contours are within normal limits. Both lungs are clear. The visualized skeletal structures are unremarkable. IMPRESSION: No active cardiopulmonary disease. Electronically Signed   By: Norman Gatlin M.D.   On: 05/01/2024 01:52      Subjective: Denies complaints.  No chest pain or dyspnea.  Was somewhat concerned about her high blood pressure.  Aware that  cardiology has increased her dose of lisinopril  and she feels more comfortable with that.  States that she does check her blood pressures at home as well.  Advised her that some of the elevated blood pressure may also be related to stress/anxiety of hospitalization.  Discharge Exam:  Vitals:   05/03/24 0905 05/03/24 1010 05/03/24 1156 05/03/24 1158  BP: (!) 154/98 (!) 170/80 (!) 166/79 (!) 166/82  Pulse:  71 65   Resp:   18   Temp:   97.6 F (36.4 C)   TempSrc:   Oral   SpO2:  100%    Weight:      Height:        General exam: Young female, moderately built and nourished sitting up comfortably in bed without distress. Respiratory system: Clear to auscultation. Respiratory effort normal. Cardiovascular system: S1 & S2 heard, RRR. No JVD, murmurs, rubs, gallops or  clicks. No pedal edema.  Telemetry personally reviewed: SR-SB in the 16s. Gastrointestinal system: Abdomen is nondistended, soft and nontender. No organomegaly or masses felt. Normal bowel sounds heard. Central nervous system: Alert and oriented. No focal neurological deficits. Extremities: Symmetric 5 x 5 power.  Cath access site in the wrist is unremarkable.  No acute findings. Skin: No rashes, lesions or ulcers Psychiatry: Judgement and insight appear normal. Mood & affect appropriate.     The results of significant diagnostics from this hospitalization (including imaging, microbiology, ancillary and laboratory) are listed below for reference.     Microbiology: No results found for this or any previous visit (from the past 240 hours).   Labs: CBC: Recent Labs  Lab 05/01/24 0129 05/01/24 1351 05/02/24 0520 05/02/24 1100 05/03/24 0456  WBC 7.7 6.3 5.7 6.4 6.3  NEUTROABS  --   --   --  4.3  --   HGB 13.5 12.6 12.2 12.2 12.1  HCT 41.5 39.1 37.6 37.2 36.9  MCV 92.8 94.4 91.5 90.7 90.2  PLT 169 164 162 154 155    Basic Metabolic Panel: Recent Labs  Lab 05/01/24 0129 05/01/24 1351 05/02/24 0520  05/03/24 0456  NA 139  --  138 140  K 4.4  --  4.0 3.8  CL 103  --  105 105  CO2 27  --  24 29  GLUCOSE 112*  --  97 97  BUN 17  --  16 11  CREATININE 0.93 1.06* 0.86 0.89  CALCIUM  9.5  --  9.1 9.3    Liver Function Tests: Recent Labs  Lab 05/02/24 1100  AST 14*  ALT 10  ALKPHOS 62  BILITOT 0.9  PROT 6.2*  ALBUMIN 3.4*    Lipid Profile Recent Labs    05/02/24 0520  CHOL 220*  HDL 58  LDLCALC 139*  TRIG 114  CHOLHDL 3.8      Time coordinating discharge: 25 minutes  SIGNED:  Trenda Mar, MD,  FACP, [facility], [facility], [facility]   [facility] Physician Advisor [place]     To contact the attending provider between 7A-7P or the covering provider during after hours 7P-7A, please log into the web site [email or web address] and access using universal [place] password for that web site. If you do not have the password, please call the hospital operator.

## 2024-05-03 NOTE — Progress Notes (Signed)
 Discharge teaching complete. Meds, diet, activity, follow up appointments TR band education reviewed and all questions answered. Copy of instructions given to patient and meds to be picked up at [facility].

## 2024-05-04 ENCOUNTER — Ambulatory Visit: Payer: Self-pay

## 2024-05-04 ENCOUNTER — Telehealth: Payer: Self-pay

## 2024-05-04 LAB — LIPOPROTEIN A (LPA): Lipoprotein (a): 28.8 nmol/L (ref <75.0)

## 2024-05-04 NOTE — Transitions of Care (Post Inpatient/ED Visit) (Signed)
 05/04/2024  Name: Kathy Stephens MRN: [id number] DOB: 07/02/1961  Today's TOC FU Call Status: Today's TOC FU Call Status:: Successful TOC FU Call Completed TOC FU Call Complete Date: 05/04/24 Patient's Name and Date of Birth confirmed.  Transition Care Management Follow-up Telephone Call Date of Discharge: 05/03/24 Discharge Facility: [facility]) Type of Discharge: Inpatient Admission Primary Inpatient Discharge Diagnosis:: NSTEMI How have you been since you were released from the hospital?: Better Any questions or concerns?: No  Items Reviewed: Did you receive and understand the discharge instructions provided?: Yes Medications obtained,verified, and reconciled?: Yes (Medications Reviewed) Any new allergies since your discharge?: No Dietary orders reviewed?: Yes Do you have support at home?: Yes People in Home [RPT]: spouse  Medications Reviewed Today: Medications Reviewed Today     Reviewed by Emmitt Pan, LPN (Licensed Practical Nurse) on 05/04/24 at [phone]  Med List Status: <None>   Medication Order Taking? Sig Documenting Provider Last Dose Status Informant  acetaminophen  (TYLENOL ) 500 MG tablet [id number] Yes Take 500-1,000 mg by mouth every 6 (six) hours as needed for moderate pain (pain score 4-6). [provider]  Active Self, Pharmacy Records  aspirin  EC 81 MG tablet [id number] Yes Take 1 tablet (81 mg total) by mouth daily. Swallow whole. Penumalli, Vikram R, MD  Active Self, Pharmacy Records           Med Note JACKOLYN WADDELL VEAR [place] May 01, 2024  8:44 AM) Typically takes 1QD, but took 4 tablets at 0000  diphenhydrAMINE (BENADRYL) 25 mg capsule [id number] Yes Take 25 mg by mouth at bedtime. [provider]  Active Self, Pharmacy Records  famotidine (PEPCID) 20 MG tablet [id number] Yes Take 20 mg by mouth daily. [provider]  Active Self, Pharmacy Records  lisinopril  (ZESTRIL ) 10 MG tablet [id number] Yes Take 2 tablets (20 mg total)  by mouth daily. Hongalgi, Anand D, MD  Active   loperamide (IMODIUM A-D) 2 MG tablet [id number] Yes Take 2 mg by mouth daily. [provider]  Active Self, Pharmacy Records           Med Note JACKOLYN WADDELL VEAR [place] May 01, 2024  8:45 AM)    nicotine (NICODERM CQ - DOSED IN MG/24 HOURS) 14 mg/24hr patch [id number] Yes Place 1 patch (14 mg total) onto the skin daily. Hongalgi, Anand D, MD  Active   nitroGLYCERIN  (NITROSTAT ) 0.4 MG SL tablet [id number] Yes Place 1 tablet (0.4 mg total) under the tongue every 5 (five) minutes as needed for chest pain for up to 3 doses. Hongalgi, Anand D, MD  Active   rosuvastatin (CRESTOR) 10 MG tablet [id number] Yes Take 1 tablet (10 mg total) by mouth daily. Hongalgi, Anand D, MD  Active   ticagrelor (BRILINTA) 90 MG TABS tablet [id number] Yes Take 1 tablet (90 mg total) by mouth 2 (two) times daily. Judeth Trenda BIRCH, MD  Active             Home Care and Equipment/Supplies: Were Home Health Services Ordered?: NA Any new equipment or medical supplies ordered?: NA  Functional Questionnaire: Do you need assistance with bathing/showering or dressing?: No Do you need assistance with meal preparation?: No Do you need assistance with eating?: No Do you have difficulty maintaining continence: No Do you need assistance with getting out of bed/getting out of a chair/moving?: No Do you have difficulty managing or taking your medications?: No  Follow up appointments reviewed: PCP Follow-up appointment confirmed?: No (no avail  appt. sent message to staff) MD Provider Line Number:[phone] Given: No Specialist Hospital Follow-up appointment confirmed?: Yes Date of Specialist follow-up appointment?: 05/10/24 Follow-Up Specialty Provider:: cardio Do you need transportation to your follow-up appointment?: No Do you understand care options if your condition(s) worsen?: Yes-patient verbalized understanding    SIGNATURE Julian Lemmings, LPN [facility] Nurse  Health Advisor Direct Dial [phone]

## 2024-05-04 NOTE — Telephone Encounter (Signed)
 Pt is scheduled for 05/09/24 @2 :10

## 2024-05-09 ENCOUNTER — Ambulatory Visit: Admitting: Family Medicine

## 2024-05-09 ENCOUNTER — Encounter: Payer: Self-pay | Admitting: Family Medicine

## 2024-05-09 VITALS — BP 111/69 | HR 62 | Ht 62.01 in | Wt 169.2 lb

## 2024-05-09 DIAGNOSIS — I10 Essential (primary) hypertension: Secondary | ICD-10-CM | POA: Diagnosis not present

## 2024-05-09 DIAGNOSIS — I214 Non-ST elevation (NSTEMI) myocardial infarction: Secondary | ICD-10-CM

## 2024-05-09 NOTE — Patient Instructions (Signed)
 It was nice to see you today,  We addressed the following topics today: - your blood pressure looks good today.  No changes to your current medications.   - if you go on a plane or long car ride try to stand up and walk for a few minutes every hour.    Have a great day,  Rolan Slain, MD

## 2024-05-09 NOTE — Assessment & Plan Note (Signed)
 Coronary Artery Disease s/p Myocardial Infarction with 99% LAD stenosis, treated with cardiac catheterization and stent placement. Admitted to hospital after presenting with chest pressure, high blood pressure, and elevated troponins. Now post-procedure, doing well. No current chest pain. Blood pressure improving on increased medication. Enrolled in cardiac rehab and a research study. - Continue Aspirin  81 mg daily. - Continue Brilinta  twice daily for 1 year. - Continue Crestor  10 mg daily. - continue lisinopril  20mg  daily - Continue to monitor blood pressure at home. - Has PRN nitroglycerin , counseled on use. Has not used it yet.  - Will manage prescriptions here as needed. - Follow up with cardiology as scheduled.

## 2024-05-09 NOTE — Progress Notes (Unsigned)
 Established Patient Office Visit  Subjective   Patient ID: Kathy Stephens, female    DOB: [birth year]  Age: 63 y.o. MRN: [id number]  Chief Complaint  Patient presents with   Hospitalization Follow-up    HPI  Subjective - Hospital follow-up for myocardial infarction. - Symptoms leading to hospitalization included very high blood pressure (up to 200/110), headache, nausea without emesis, intermittent sweating, back pain, and severe squeezing chest pressure. - Took 4 baby aspirins at home with some relief. Woke from sleep twice with severe pain and pressure before going to the hospital. - In the ED, had an EKG and blood work. Left after a 2.5-hour wait in the waiting room. - Received a call from the ED physician to return due to elevated troponin levels (initially 200, rose to 360), indicating ongoing cardiac events. - Admitted to hospital, underwent cardiac catheterization which revealed 99% stenosis of the LAD artery. - Reports trying bergamot tea for cholesterol, which lowered it from 250 to 220, but was not taking statin medication prior to the event due to previous leg pain. - Reports some anxiety since returning home, which was described as normal by the hospital team. - No chest pain since discharge. Had one episode of sweating and headache in the hospital after walking, but was assessed and deemed okay. - Denies numbness or weakness in the arm used for catheterization. Reports pain at the access site which is improving. - Enrolled in a research program involving a monthly injection to study inflammation's role in heart attacks. Has already received two injections. - Plans to fly to [place], [place], then drive to [address] (6-hour drive). Cardiologist cleared for travel.  Medications: Aspirin  81 mg daily, Brilinta  twice daily (for one year), Crestor  10 mg daily (new), lisinopril  dose increased to two 10mg  tablets daily, famotidine  once daily, and amlodipine. Takes diphenhydramine  (Benadryl) at night for sleep. Has nitroglycerin  but has not needed to use it.  PMH, PSH, FH, Social Hx: PMHx: Myocardial infarction, hypertension, hyperlipidemia, history of TIAs. FH: Significant for heart disease. Father had a pacemaker, mother had two stents, aunts and uncles also with cardiac issues. Social Hx: Lives with husband.  ROS: Pertinent positives: history of squeezing chest pain, back pain, high blood pressure, headache, nausea, sweating. Pertinent negatives: no chest pain, numbness, or weakness post-procedure.     The ASCVD Risk score (Arnett DK, et al., 2019) failed to calculate for the following reasons:   Risk score cannot be calculated because patient has a medical history suggesting prior/existing ASCVD  Health Maintenance Due  Topic Date Due   Hepatitis C Screening  Never done   Pneumococcal Vaccine 82-64 Years old (1 of 2 - PCV) Never done   Zoster Vaccines- Shingrix (1 of 2) Never done   COVID-19 Vaccine (1 - 2024-25 season) Never done      Objective:     BP 111/69   Pulse 62   Ht 5' 2.01 (1.575 m)   Wt 169 lb 4 oz (76.8 kg)   LMP 11/03/2006   SpO2 100%   BMI 30.95 kg/m    Physical Exam Gen: alert, oriented Cv: rrr, no murmur Pulm: lctab Skin: bruising on the left anterior wrist.    No results found for any visits on 05/09/24.      Assessment & Plan:   NSTEMI (non-ST elevated myocardial infarction) [facility]) Assessment & Plan: Coronary Artery Disease s/p Myocardial Infarction with 99% LAD stenosis, treated with cardiac catheterization and stent placement. Admitted to hospital  after presenting with chest pressure, high blood pressure, and elevated troponins. Now post-procedure, doing well. No current chest pain. Blood pressure improving on increased medication. Enrolled in cardiac rehab and a research study. - Continue Aspirin  81 mg daily. - Continue Brilinta  twice daily for 1 year. - Continue Crestor  10 mg daily. - continue lisinopril  20mg   daily - Continue to monitor blood pressure at home. - Has PRN nitroglycerin , counseled on use. Has not used it yet.  - Will manage prescriptions here as needed. - Follow up with cardiology as scheduled.   Primary hypertension Assessment & Plan: Bp at goal today.  Pt on 20mg  lisinopril  and not complaining of side effects.   - continue lisinopril  20mg  daily.       Return in about 3 months (around 08/09/2024) for hld, HTN.    Kathy MARLA Slain, MD

## 2024-05-09 NOTE — Assessment & Plan Note (Signed)
 Bp at goal today.  Pt on 20mg  lisinopril  and not complaining of side effects.   - continue lisinopril  20mg  daily.

## 2024-05-10 ENCOUNTER — Ambulatory Visit: Attending: Cardiology | Admitting: Cardiology

## 2024-05-10 ENCOUNTER — Encounter: Payer: Self-pay | Admitting: Cardiology

## 2024-05-10 VITALS — BP 120/72 | HR 73 | Ht 62.0 in | Wt 168.4 lb

## 2024-05-10 DIAGNOSIS — E782 Mixed hyperlipidemia: Secondary | ICD-10-CM

## 2024-05-10 DIAGNOSIS — I10 Essential (primary) hypertension: Secondary | ICD-10-CM

## 2024-05-10 DIAGNOSIS — I214 Non-ST elevation (NSTEMI) myocardial infarction: Secondary | ICD-10-CM

## 2024-05-10 NOTE — Patient Instructions (Signed)
 Medication Instructions:  No changes *If you need a refill on your cardiac medications before your next appointment, please call your pharmacy*  Lab Work: Today we are going to draw a TSH, Free T4, and BMP If you have labs (blood work) drawn today and your tests are completely normal, you will receive your results only by: MyChart Message (if you have MyChart) OR A paper copy in the mail If you have any lab test that is abnormal or we need to change your treatment, we will call you to review the results.  Testing/Procedures: No testing  Follow-Up: At [facility], you and your health needs are our priority.  As part of our continuing mission to provide you with exceptional heart care, our providers are all part of one team.  This team includes your primary Cardiologist (physician) and Advanced Practice Providers or APPs (Physician Assistants and Nurse Practitioners) who all work together to provide you with the care you need, when you need it.  Your next appointment:   2 month(s)  Provider:   Rollo Louder, PA-C  We recommend signing up for the patient portal called MyChart.  Sign up information is provided on this After Visit Summary.  MyChart is used to connect with patients for Virtual Visits (Telemedicine).  Patients are able to view lab/test results, encounter notes, upcoming appointments, etc.  Non-urgent messages can be sent to your provider as well.   To learn more about what you can do with MyChart, go to [email or web address].

## 2024-05-11 ENCOUNTER — Ambulatory Visit: Payer: Self-pay | Admitting: Cardiology

## 2024-05-11 LAB — BASIC METABOLIC PANEL WITH GFR
BUN/Creatinine Ratio: 29 — ABNORMAL HIGH (ref 12–28)
BUN: 27 mg/dL (ref 8–27)
CO2: 18 mmol/L — ABNORMAL LOW (ref 20–29)
Calcium: 9.4 mg/dL (ref 8.7–10.3)
Chloride: 101 mmol/L (ref 96–106)
Creatinine, Ser: 0.92 mg/dL (ref 0.57–1.00)
Glucose: 107 mg/dL — ABNORMAL HIGH (ref 70–99)
Potassium: 4.5 mmol/L (ref 3.5–5.2)
Sodium: 138 mmol/L (ref 134–144)
eGFR: 71 mL/min/1.73 (ref >59)

## 2024-05-11 LAB — TSH: TSH: 2.16 u[IU]/mL (ref 0.450–4.500)

## 2024-05-11 LAB — T4, FREE: Free T4: 1.3 ng/dL (ref 0.82–1.77)

## 2024-05-17 ENCOUNTER — Telehealth (HOSPITAL_COMMUNITY): Payer: Self-pay

## 2024-05-17 NOTE — Telephone Encounter (Signed)
 Patient called back to get scheduled in the Cardiac Rehab Program. Patient will come in for orientation on 7/31 and will attend the 10:15 exercise class.  Sent MyChart message.

## 2024-05-17 NOTE — Telephone Encounter (Signed)
 Pt insurance is active and benefits verified through [facility]. Co-pay $25, DED $0/$0 met, out of pocket $3,000/$960 met, co-insurance 0%. No pre-authorization required. 05/17/2024 @ 9:57am, spoke with Alan HERO., REF# [id number].  How many CR sessions are covered? (36 visits for TCR, 72 visits for ICR) 36 visit limit ICR (72 sessions) Is this a lifetime maximum or an annual maximum? Annual Has the member used any of these services to date? No Is there a time limit (weeks/months) on start of program and/or program completion? No

## 2024-05-17 NOTE — Telephone Encounter (Signed)
 Attempted to call patient regarding cardiac rehab- no answer, left message to call us  back. Sent MyChart message.

## 2024-05-26 ENCOUNTER — Other Ambulatory Visit: Payer: Self-pay | Admitting: Family Medicine

## 2024-05-26 ENCOUNTER — Encounter: Payer: Self-pay | Admitting: Family Medicine

## 2024-05-26 MED ORDER — TICAGRELOR 90 MG PO TABS: 90.0000 mg | ORAL_TABLET | Freq: Two times a day (BID) | ORAL | 3 refills | Status: AC

## 2024-05-26 MED ORDER — ROSUVASTATIN CALCIUM 10 MG PO TABS: 10.0000 mg | ORAL_TABLET | Freq: Every day | ORAL | 3 refills | Status: DC

## 2024-05-26 MED ORDER — LISINOPRIL 10 MG PO TABS: 20.0000 mg | ORAL_TABLET | Freq: Every day | ORAL | 3 refills | Status: AC

## 2024-05-31 ENCOUNTER — Encounter

## 2024-05-31 VITALS — BP 134/80 | HR 61 | Ht 62.0 in

## 2024-05-31 DIAGNOSIS — Z006 Encounter for examination for normal comparison and control in clinical research program: Secondary | ICD-10-CM

## 2024-05-31 MED ORDER — STUDY - ARTEMIS - ZILTIVEKIMAB 15 MG/0.5 ML OR PLACEBO SQ INJECTION (PI-CHRISTOPHER)
15.0000 mg | INJECTION | Freq: Once | SUBCUTANEOUS | Status: AC
  Administered 2024-05-31: 15 mg via SUBCUTANEOUS
  Filled 2024-05-31: qty 0.5

## 2024-05-31 MED ORDER — STUDY - ARTEMIS - ZILTIVEKIMAB 15 MG/0.5 ML OR PLACEBO SQ INJECTION (PI-CHRISTOPHER): 15.0000 mg | INJECTION | SUBCUTANEOUS | 0 refills | Status: DC

## 2024-05-31 NOTE — Research (Addendum)
  ARTEMIS V3 (Month 1 +/-3 days)  Were all eligibility criteria met to continue in study? [x] Yes [] No   Hep B DNA monitoring: [] Yes [x] No    Concomitant meds: [x] Yes [] No    Height, VS: [x] Yes [] No VS: Subject resting for >5 mins before 3 sets of VS taken.    Any hospitalizations/Adverse Events/Infections identified:[x] Yes [] No Subject endorses occasional hot flashes that have started since index AMI around 05/04/24. Seen by PCP with no intervention at this time. Will continue to monitor.   Central Lab assessments drawn: [x] Yes [] No   Hs-CRP, Lipids, biochemistry/hematology, PK sampling, immunogenicity assessments   Pregnancy Test Was the sample collected? [] Yes [] No [x] N/A Was the collection date the same as the visit date? [] Yes [] No Specimen Type: [] Serum [] Urine Collection Date: Collection Time: Pregnancy Test Result: [] Positive [] Negative [] Borderline [] Invalid   Administer training of study intervention and dosing instructions and supervised self administration of study intervention during the site visit: [x] Yes [] No  Subject successfully self administered study drug in R lower abdomen at 1235 without complication from kit# [id number]. Pen thrown in sharps container. Second pen taken home with subject for home dose. Subject verbalized understanding to bring back pen with box to next appt.    Study Drug dispensed via [facility]: [x] Yes [] No   Ensure updated contact person list: [x] Yes [] No   Subject given DFU and dosing diary/updated ID card with infection info: [x] Yes [] No    Current Outpatient Medications:    acetaminophen  (TYLENOL ) 500 MG tablet, Take 500-1,000 mg by mouth every 6 (six) hours as needed for moderate pain (pain score 4-6)., Disp: , Rfl:    aspirin  EC 81 MG tablet, Take 1 tablet (81 mg total) by mouth daily. Swallow whole., Disp: 30 tablet, Rfl: 12   diphenhydrAMINE (BENADRYL) 25 mg capsule, Take 25 mg by mouth at bedtime., Disp: , Rfl:    famotidine  (PEPCID )  20 MG tablet, Take 20 mg by mouth daily., Disp: , Rfl:    lisinopril  (ZESTRIL ) 10 MG tablet, Take 2 tablets (20 mg total) by mouth daily., Disp: 180 tablet, Rfl: 3   loperamide (IMODIUM A-D) 2 MG tablet, Take 2 mg by mouth daily., Disp: , Rfl:    nitroGLYCERIN  (NITROSTAT ) 0.4 MG SL tablet, Place 1 tablet (0.4 mg total) under the tongue every 5 (five) minutes as needed for chest pain for up to 3 doses., Disp: 100 tablet, Rfl: 3   rosuvastatin  (CRESTOR ) 10 MG tablet, Take 1 tablet (10 mg total) by mouth daily., Disp: 90 tablet, Rfl: 3   Study - ARTEMIS - ziltivekimab 15 mg/0.5 mL or placebo SQ injection (PI-Christopher), Inject 0.5 mLs (15 mg total) into the skin every 28 (twenty-eight) days. For Investigational Use Only. Bring boxes and pens back to research visits., Disp: 0.5 mL, Rfl: 0   ticagrelor  (BRILINTA ) 90 MG TABS tablet, Take 1 tablet (90 mg total) by mouth 2 (two) times daily., Disp: 180 tablet, Rfl: 3  Current Facility-Administered Medications:    Study - ARTEMIS - ziltivekimab 15 mg/0.5 mL or placebo SQ injection (PI-Christopher), 15 mg, Subcutaneous, Once,

## 2024-06-01 ENCOUNTER — Encounter

## 2024-06-02 ENCOUNTER — Encounter (HOSPITAL_COMMUNITY)
Admission: RE | Admit: 2024-06-02 | Discharge: 2024-06-02 | Disposition: A | Discharge status: Home / Self Care | Source: Ambulatory Visit | Attending: Cardiology | Admitting: Cardiology

## 2024-06-02 VITALS — BP 116/72 | HR 61 | Ht 62.0 in | Wt 173.1 lb

## 2024-06-02 DIAGNOSIS — Z955 Presence of coronary angioplasty implant and graft: Secondary | ICD-10-CM | POA: Diagnosis present

## 2024-06-02 DIAGNOSIS — I214 Non-ST elevation (NSTEMI) myocardial infarction: Secondary | ICD-10-CM | POA: Diagnosis present

## 2024-06-02 NOTE — Research (Addendum)
Are there any labs that are clinically significant?  Yes [] OR No[x]  Is the patient eligible to continue enrollment in the study after screening visit?  Yes [x]  OR No[]          

## 2024-06-02 NOTE — Progress Notes (Signed)
 Cardiac Rehab Medication Review   Does the patient  feel that his/her medications are working for him/her? Yes    Has the patient been experiencing any side effects to the medications prescribed? Yes  Does the patient measure his/her own blood pressure or blood glucose at home?   Yes  Does the patient have any problems obtaining medications due to transportation or finances?   No  Understanding of regimen: excellent Understanding of indications: excellent Potential of compliance: excellent    Comments: Kathy Stephens understands her medications and regime well. She shared that she has SOB, swimmy headedness, and headaches with her medications. Has not told MD, I will let RN Kathy Stephens know. She does not feel limited by these side effects.    Con [name], MS, ACSM-CEP 06/02/2024 1:49 PM

## 2024-06-02 NOTE — Progress Notes (Signed)
 Cardiac Individual Treatment Plan  Patient Details  Name: Kathy Stephens MRN: [id number] Date of Birth: 03-19-1961 Referring Provider:   Flowsheet Row INTENSIVE CARDIAC REHAB ORIENT from 06/02/2024 in [facility]  Referring Provider Newman Lawrence, MD    Initial Encounter Date:  Flowsheet Row INTENSIVE CARDIAC REHAB ORIENT from 06/02/2024 in [facility]  Date 06/02/24    Visit Diagnosis: 05/02/24 NSTEMI (non-ST elevated myocardial infarction) (HCC)  05/02/24 DES LAD  Patient's Home Medications on Admission:  Current Outpatient Medications:    acetaminophen  (TYLENOL ) 500 MG tablet, Take 500-1,000 mg by mouth every 6 (six) hours as needed for moderate pain (pain score 4-6)., Disp: , Rfl:    aspirin  EC 81 MG tablet, Take 1 tablet (81 mg total) by mouth daily. Swallow whole., Disp: 30 tablet, Rfl: 12   diphenhydrAMINE (BENADRYL) 25 mg capsule, Take 25 mg by mouth at bedtime., Disp: , Rfl:    famotidine  (PEPCID ) 20 MG tablet, Take 20 mg by mouth daily., Disp: , Rfl:    lisinopril  (ZESTRIL ) 10 MG tablet, Take 2 tablets (20 mg total) by mouth daily., Disp: 180 tablet, Rfl: 3   loperamide (IMODIUM A-D) 2 MG tablet, Take 2 mg by mouth daily., Disp: , Rfl:    nitroGLYCERIN  (NITROSTAT ) 0.4 MG SL tablet, Place 1 tablet (0.4 mg total) under the tongue every 5 (five) minutes as needed for chest pain for up to 3 doses., Disp: 100 tablet, Rfl: 3   rosuvastatin  (CRESTOR ) 10 MG tablet, Take 1 tablet (10 mg total) by mouth daily., Disp: 90 tablet, Rfl: 3   Study - ARTEMIS - ziltivekimab 15 mg/0.5 mL or placebo SQ injection (PI-Christopher), Inject 0.5 mLs (15 mg total) into the skin every 28 (twenty-eight) days. For Investigational Use Only. Bring boxes and pens back to research visits., Disp: 0.5 mL, Rfl: 0   ticagrelor  (BRILINTA ) 90 MG TABS tablet, Take 1 tablet (90 mg total) by mouth 2  (two) times daily., Disp: 180 tablet, Rfl: 3  Past Medical History: Past Medical History:  Diagnosis Date   Allergy    Anemia 06/02/2013   Anxiety 11/05/2011   Avascular necrosis of femur, left (HCC) 03/19/2022   Cancer (HCC) 06/02/2013   skin (nose & face)   Depression 11/05/2011   Fibromyalgia 04/09/2021   GERD (gastroesophageal reflux disease) 11/05/2011   Hyperlipidemia 02/03/2013   Hypertension 04/09/2021   Inflammatory bowel disease (ulcerative colitis) (HCC) 10/08/2017   Migraines 06/02/2013   while on Liada (mesalazine), not on it at this time   Osteopenia 06/02/2013   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   TIA (transient ischemic attack) 05/13/2023    Tobacco Use: Social History   Tobacco Use  Smoking Status Every Day   Types: Cigars   Passive exposure: Current  Smokeless Tobacco Never  Tobacco Comments   5 daily    Labs: Review Flowsheet  More data exists      Latest Ref Rng & Units 11/27/2017 03/29/2021 05/12/2023 07/02/2023 05/02/2024  Labs for ITP Cardiac and Pulmonary Rehab  Cholestrol 0 - 200 mg/dL 755  740  - 827  779   LDL (calc) 0 - 99 mg/dL 840  830  - 91  860   HDL-C >40 mg/dL 55  56  - 55  58   Trlycerides <150 mg/dL 849  814  - 849  885   Hemoglobin A1c 4.8 - 5.6 % - - -  5.6  -  TCO2 22 - 32 mmol/L - - 26  - -    Capillary Blood Glucose: No results found for: GLUCAP   Exercise Target Goals: Exercise Program Goal: Individual exercise prescription set using results from initial 6 min walk test and THRR while considering  patient's activity barriers and safety.   Exercise Prescription Goal: Initial exercise prescription builds to 30-45 minutes a day of aerobic activity, 2-3 days per week.  Home exercise guidelines will be given to patient during program as part of exercise prescription that the participant will acknowledge.  Activity Barriers & Risk Stratification:  Activity Barriers & Cardiac Risk Stratification - 06/02/24 1429       Activity  Barriers & Cardiac Risk Stratification   Activity Barriers Arthritis;Balance Concerns;Shortness of Breath;Deconditioning;Assistive Device    Cardiac Risk Stratification High   <5 METs on         6 Minute Walk:  6 Minute Walk     Row Name 06/02/24 1531         6 Minute Walk   Phase Initial     Distance 1245 feet     Walk Time 6 minutes     # of Rest Breaks 0     MPH 2.36     METS 3.14     RPE 12     Perceived Dyspnea  1     VO2 Peak 10.98     Symptoms Yes (comment)     Comments 6/10 L chronic calf pain. SOB. Both resolved with rest     Resting HR 61 bpm     Resting BP 116/72     Resting Oxygen Saturation  98 %     Exercise Oxygen Saturation  during 6 min walk 99 %     Max Ex. HR 99 bpm     Max Ex. BP 152/68     2 Minute Post BP 124/64        Oxygen Initial Assessment:   Oxygen Re-Evaluation:   Oxygen Discharge (Final Oxygen Re-Evaluation):   Initial Exercise Prescription:  Initial Exercise Prescription - 06/02/24 1500       Date of Initial Exercise RX and Referring Provider   Date 06/02/24    Referring Provider Newman Lawrence, MD    Expected Discharge Date 08/24/24      NuStep   Level 1    SPM 70    Minutes 15    METs 2      Prescription Details   Frequency (times per week) 3    Duration Progress to 30 minutes of continuous aerobic without signs/symptoms of physical distress      Intensity   THRR 40-80% of Max Heartrate 58-115    Ratings of Perceived Exertion 11-13    Perceived Dyspnea 0-4      Progression   Progression Continue progressive overload as per policy without signs/symptoms or physical distress.      Resistance Training   Training Prescription Yes    Weight 2    Reps 10-15          Perform Capillary Blood Glucose checks as needed.  Exercise Prescription Changes:   Exercise Comments:   Exercise Goals and Review:   Exercise Goals     Row Name 06/02/24 1429             Exercise Goals   Increase  Physical Activity Yes       Intervention Provide advice, education, support and counseling about physical  activity/exercise needs.;Develop an individualized exercise prescription for aerobic and resistive training based on initial evaluation findings, risk stratification, comorbidities and participant's personal goals.       Expected Outcomes Short Term: Attend rehab on a regular basis to increase amount of physical activity.;Long Term: Exercising regularly at least 3-5 days a week.;Long Term: Add in home exercise to make exercise part of routine and to increase amount of physical activity.       Increase Strength and Stamina Yes       Intervention Provide advice, education, support and counseling about physical activity/exercise needs.;Develop an individualized exercise prescription for aerobic and resistive training based on initial evaluation findings, risk stratification, comorbidities and participant's personal goals.       Expected Outcomes Short Term: Increase workloads from initial exercise prescription for resistance, speed, and METs.;Short Term: Perform resistance training exercises routinely during rehab and add in resistance training at home;Long Term: Improve cardiorespiratory fitness, muscular endurance and strength as measured by increased METs and functional capacity ( )       Able to understand and use rate of perceived exertion (RPE) scale Yes       Intervention Provide education and explanation on how to use RPE scale       Expected Outcomes Short Term: Able to use RPE daily in rehab to express subjective intensity level;Long Term:  Able to use RPE to guide intensity level when exercising independently       Knowledge and understanding of Target Heart Rate Range (THRR) Yes       Intervention Provide education and explanation of THRR including how the numbers were predicted and where they are located for reference       Expected Outcomes Short Term: Able to state/look up THRR;Long  Term: Able to use THRR to govern intensity when exercising independently;Short Term: Able to use daily as guideline for intensity in rehab       Understanding of Exercise Prescription Yes       Intervention Provide education, explanation, and written materials on patient's individual exercise prescription       Expected Outcomes Short Term: Able to explain program exercise prescription;Long Term: Able to explain home exercise prescription to exercise independently          Exercise Goals Re-Evaluation :   Discharge Exercise Prescription (Final Exercise Prescription Changes):   Nutrition:  Target Goals: Understanding of nutrition guidelines, daily intake of sodium 1500mg , cholesterol 200mg , calories 30% from fat and 7% or less from saturated fats, daily to have 5 or more servings of fruits and vegetables.  Biometrics:  Pre Biometrics - 06/02/24 1428       Pre Biometrics   Waist Circumference 39.5 inches    Hip Circumference 47 inches    Waist to Hip Ratio 0.84 %    Triceps Skinfold 32 mm    % Body Fat 43.3 %    Grip Strength 10 kg    Flexibility --   not done- hip issues   Single Leg Stand 16.56 seconds           Nutrition Therapy Plan and Nutrition Goals:   Nutrition Assessments:  MEDIFICTS Score Key: >=70 Need to make dietary changes  40-70 Heart Healthy Diet <= 40 Therapeutic Level Cholesterol Diet    Picture Your Plate Scores: <59 Unhealthy dietary pattern with much room for improvement. 41-50 Dietary pattern unlikely to meet recommendations for good health and room for improvement. 51-60 More healthful dietary pattern, with some room for improvement.  >  60 Healthy dietary pattern, although there may be some specific behaviors that could be improved.    Nutrition Goals Re-Evaluation:   Nutrition Goals Re-Evaluation:   Nutrition Goals Discharge (Final Nutrition Goals Re-Evaluation):   Psychosocial: Target Goals: Acknowledge presence or absence of  significant depression and/or stress, maximize coping skills, provide positive support system. Participant is able to verbalize types and ability to use techniques and skills needed for reducing stress and depression.  Initial Review & Psychosocial Screening:  Initial Psych Review & Screening - 06/02/24 1430       Initial Review   Current issues with Current Anxiety/Panic      Family Dynamics   Good Support System? Yes   husband/friends   Comments Suzetta shared that she has had some anxiety since her MI. She shared that she has fear that something else will happen and that on a recent flight to see family she experienced 2 panic attacks. She denies any depression with this. Support offered, Muriel shared that she does not need any additional support or resources at this time. She has friends and family who are helping her.      Barriers   Psychosocial barriers to participate in program The patient should benefit from training in stress management and relaxation.;Psychosocial barriers identified (see note)      Screening Interventions   Interventions Provide feedback about the scores to participant;To provide support and resources with identified psychosocial needs;Encouraged to exercise    Expected Outcomes Long Term goal: The participant improves quality of Life and PHQ9 Scores as seen by post scores and/or verbalization of changes;Short Term goal: Identification and review with participant of any Quality of Life or Depression concerns found by scoring the questionnaire.;Long Term Goal: Stressors or current issues are controlled or eliminated.          Quality of Life Scores:  Quality of Life - 06/02/24 1534       Quality of Life   Select Quality of Life      Quality of Life Scores   Health/Function Pre 17.17 %    Socioeconomic Pre 24.07 %    Psych/Spiritual Pre 19.92 %    Family Pre 24.4 %    GLOBAL Pre 20.23 %         Scores of 19 and below usually indicate a poorer  quality of life in these areas.  A difference of  2-3 points is a clinically meaningful difference.  A difference of 2-3 points in the total score of the Quality of Life Index has been associated with significant improvement in overall quality of life, self-image, physical symptoms, and general health in studies assessing change in quality of life.  PHQ-9: Review Flowsheet  More data exists      06/02/2024 05/09/2024 03/21/2024 10/13/2023 09/08/2023  Depression screen PHQ 2/9  Decreased Interest 0 0 0 0 0  Down, Depressed, Hopeless 0 0 0 0 0  PHQ - 2 Score 0 0 0 0 0  Altered sleeping 2 1 0 0 0  Tired, decreased energy 1 1 0 0 0  Change in appetite 0 0 0 0 0  Feeling bad or failure about yourself  0 0 0 0 0  Trouble concentrating 0 1 0 0 0  Moving slowly or fidgety/restless 0 0 0 0 0  Suicidal thoughts 0 0 0 0 0  PHQ-9 Score 3 3 0 0 0  Difficult doing work/chores Not difficult at all - - Not difficult at all Not difficult at  all   Interpretation of Total Score  Total Score Depression Severity:  1-4 = Minimal depression, 5-9 = Mild depression, 10-14 = Moderate depression, 15-19 = Moderately severe depression, 20-27 = Severe depression   Psychosocial Evaluation and Intervention:   Psychosocial Re-Evaluation:   Psychosocial Discharge (Final Psychosocial Re-Evaluation):   Vocational Rehabilitation: Provide vocational rehab assistance to qualifying candidates.   Vocational Rehab Evaluation & Intervention:  Vocational Rehab - 06/02/24 1535       Initial Vocational Rehab Evaluation & Intervention   Assessment shows need for Vocational Rehabilitation No   retired         Education: Education Goals: Education classes will be provided on a weekly basis, covering required topics. Participant will state understanding/return demonstration of topics presented.     Core Videos: Exercise    Move It!  Clinical staff conducted group or individual video education with verbal and  written material and guidebook.  Patient learns the recommended Pritikin exercise program. Exercise with the goal of living a long, healthy life. Some of the health benefits of exercise include controlled diabetes, healthier blood pressure levels, improved cholesterol levels, improved heart and lung capacity, improved sleep, and better body composition. Everyone should speak with their doctor before starting or changing an exercise routine.  Biomechanical Limitations Clinical staff conducted group or individual video education with verbal and written material and guidebook.  Patient learns how biomechanical limitations can impact exercise and how we can mitigate and possibly overcome limitations to have an impactful and balanced exercise routine.  Body Composition Clinical staff conducted group or individual video education with verbal and written material and guidebook.  Patient learns that body composition (ratio of muscle mass to fat mass) is a key component to assessing overall fitness, rather than body weight alone. Increased fat mass, especially visceral belly fat, can put us  at increased risk for metabolic syndrome, type 2 diabetes, heart disease, and even death. It is recommended to combine diet and exercise (cardiovascular and resistance training) to improve your body composition. Seek guidance from your physician and exercise physiologist before implementing an exercise routine.  Exercise Action Plan Clinical staff conducted group or individual video education with verbal and written material and guidebook.  Patient learns the recommended strategies to achieve and enjoy long-term exercise adherence, including variety, self-motivation, self-efficacy, and positive decision making. Benefits of exercise include fitness, good health, weight management, more energy, better sleep, less stress, and overall well-being.  Medical   Heart Disease Risk Reduction Clinical staff conducted group or  individual video education with verbal and written material and guidebook.  Patient learns our heart is our most vital organ as it circulates oxygen, nutrients, white blood cells, and hormones throughout the entire body, and carries waste away. Data supports a plant-based eating plan like the Pritikin Program for its effectiveness in slowing progression of and reversing heart disease. The video provides a number of recommendations to address heart disease.   Metabolic Syndrome and Belly Fat  Clinical staff conducted group or individual video education with verbal and written material and guidebook.  Patient learns what metabolic syndrome is, how it leads to heart disease, and how one can reverse it and keep it from coming back. You have metabolic syndrome if you have 3 of the following 5 criteria: abdominal obesity, high blood pressure, high triglycerides, low HDL cholesterol, and high blood sugar.  Hypertension and Heart Disease Clinical staff conducted group or individual video education with verbal and written material and guidebook.  Patient learns  that high blood pressure, or hypertension, is very common in the [place] . Hypertension is largely due to excessive salt intake, but other important risk factors include being overweight, physical inactivity, drinking too much alcohol, smoking, and not eating enough potassium from fruits and vegetables. High blood pressure is a leading risk factor for heart attack, stroke, congestive heart failure, dementia, kidney failure, and premature death. Long-term effects of excessive salt intake include stiffening of the arteries and thickening of heart muscle and organ damage. Recommendations include ways to reduce hypertension and the risk of heart disease.  Diseases of Our Time - Focusing on Diabetes Clinical staff conducted group or individual video education with verbal and written material and guidebook.  Patient learns why the best way to stop diseases  of our time is prevention, through food and other lifestyle changes. Medicine (such as prescription pills and surgeries) is often only a Band-Aid on the problem, not a long-term solution. Most common diseases of our time include obesity, type 2 diabetes, hypertension, heart disease, and cancer. The Pritikin Program is recommended and has been proven to help reduce, reverse, and/or prevent the damaging effects of metabolic syndrome.  Nutrition   Overview of the Pritikin Eating Plan  Clinical staff conducted group or individual video education with verbal and written material and guidebook.  Patient learns about the Pritikin Eating Plan for disease risk reduction. The Pritikin Eating Plan emphasizes a wide variety of unrefined, minimally-processed carbohydrates, like fruits, vegetables, whole grains, and legumes. Go, Caution, and Stop food choices are explained. Plant-based and lean animal proteins are emphasized. Rationale provided for low sodium intake for blood pressure control, low added sugars for blood sugar stabilization, and low added fats and oils for coronary artery disease risk reduction and weight management.  Calorie Density  Clinical staff conducted group or individual video education with verbal and written material and guidebook.  Patient learns about calorie density and how it impacts the Pritikin Eating Plan. Knowing the characteristics of the food you choose will help you decide whether those foods will lead to weight gain or weight loss, and whether you want to consume more or less of them. Weight loss is usually a side effect of the Pritikin Eating Plan because of its focus on low calorie-dense foods.  Label Reading  Clinical staff conducted group or individual video education with verbal and written material and guidebook.  Patient learns about the Pritikin recommended label reading guidelines and corresponding recommendations regarding calorie density, added sugars, sodium content,  and whole grains.  Dining Out - Part 1  Clinical staff conducted group or individual video education with verbal and written material and guidebook.  Patient learns that restaurant meals can be sabotaging because they can be so high in calories, fat, sodium, and/or sugar. Patient learns recommended strategies on how to positively address this and avoid unhealthy pitfalls.  Facts on Fats  Clinical staff conducted group or individual video education with verbal and written material and guidebook.  Patient learns that lifestyle modifications can be just as effective, if not more so, as many medications for lowering your risk of heart disease. A Pritikin lifestyle can help to reduce your risk of inflammation and atherosclerosis (cholesterol build-up, or plaque, in the artery walls). Lifestyle interventions such as dietary choices and physical activity address the cause of atherosclerosis. A review of the types of fats and their impact on blood cholesterol levels, along with dietary recommendations to reduce fat intake is also included.  Nutrition Action Plan  Clinical staff conducted group or individual video education with verbal and written material and guidebook.  Patient learns how to incorporate Pritikin recommendations into their lifestyle. Recommendations include planning and keeping personal health goals in mind as an important part of their success.  Healthy Mind-Set    Healthy Minds, Bodies, Hearts  Clinical staff conducted group or individual video education with verbal and written material and guidebook.  Patient learns how to identify when they are stressed. Video will discuss the impact of that stress, as well as the many benefits of stress management. Patient will also be introduced to stress management techniques. The way we think, act, and feel has an impact on our hearts.  How Our Thoughts Can Heal Our Hearts  Clinical staff conducted group or individual video education with verbal  and written material and guidebook.  Patient learns that negative thoughts can cause depression and anxiety. This can result in negative lifestyle behavior and serious health problems. Cognitive behavioral therapy is an effective method to help control our thoughts in order to change and improve our emotional outlook.  Additional Videos:  Exercise    Improving Performance  Clinical staff conducted group or individual video education with verbal and written material and guidebook.  Patient learns to use a non-linear approach by alternating intensity levels and lengths of time spent exercising to help burn more calories and lose more body fat. Cardiovascular exercise helps improve heart health, metabolism, hormonal balance, blood sugar control, and recovery from fatigue. Resistance training improves strength, endurance, balance, coordination, reaction time, metabolism, and muscle mass. Flexibility exercise improves circulation, posture, and balance. Seek guidance from your physician and exercise physiologist before implementing an exercise routine and learn your capabilities and proper form for all exercise.  Introduction to Yoga  Clinical staff conducted group or individual video education with verbal and written material and guidebook.  Patient learns about yoga, a discipline of the coming together of mind, breath, and body. The benefits of yoga include improved flexibility, improved range of motion, better posture and core strength, increased lung function, weight loss, and positive self-image. Yoga's heart health benefits include lowered blood pressure, healthier heart rate, decreased cholesterol and triglyceride levels, improved immune function, and reduced stress. Seek guidance from your physician and exercise physiologist before implementing an exercise routine and learn your capabilities and proper form for all exercise.  Medical   Aging: Enhancing Your Quality of Life  Clinical staff conducted  group or individual video education with verbal and written material and guidebook.  Patient learns key strategies and recommendations to stay in good physical health and enhance quality of life, such as prevention strategies, having an advocate, securing a Health Care Proxy and Power of Attorney, and keeping a list of medications and system for tracking them. It also discusses how to avoid risk for bone loss.  Biology of Weight Control  Clinical staff conducted group or individual video education with verbal and written material and guidebook.  Patient learns that weight gain occurs because we consume more calories than we burn (eating more, moving less). Even if your body weight is normal, you may have higher ratios of fat compared to muscle mass. Too much body fat puts you at increased risk for cardiovascular disease, heart attack, stroke, type 2 diabetes, and obesity-related cancers. In addition to exercise, following the Pritikin Eating Plan can help reduce your risk.  Decoding Lab Results  Clinical staff conducted group or individual video education with verbal and written material and guidebook.  Patient learns that lab test reflects one measurement whose values change over time and are influenced by many factors, including medication, stress, sleep, exercise, food, hydration, pre-existing medical conditions, and more. It is recommended to use the knowledge from this video to become more involved with your lab results and evaluate your numbers to speak with your doctor.   Diseases of Our Time - Overview  Clinical staff conducted group or individual video education with verbal and written material and guidebook.  Patient learns that according to the CDC, 50% to 70% of chronic diseases (such as obesity, type 2 diabetes, elevated lipids, hypertension, and heart disease) are avoidable through lifestyle improvements including healthier food choices, listening to satiety cues, and increased physical  activity.  Sleep Disorders Clinical staff conducted group or individual video education with verbal and written material and guidebook.  Patient learns how good quality and duration of sleep are important to overall health and well-being. Patient also learns about sleep disorders and how they impact health along with recommendations to address them, including discussing with a physician.  Nutrition  Dining Out - Part 2 Clinical staff conducted group or individual video education with verbal and written material and guidebook.  Patient learns how to plan ahead and communicate in order to maximize their dining experience in a healthy and nutritious manner. Included are recommended food choices based on the type of restaurant the patient is visiting.   Fueling a Banker conducted group or individual video education with verbal and written material and guidebook.  There is a strong connection between our food choices and our health. Diseases like obesity and type 2 diabetes are very prevalent and are in large-part due to lifestyle choices. The Pritikin Eating Plan provides plenty of food and hunger-curbing satisfaction. It is easy to follow, affordable, and helps reduce health risks.  Menu Workshop  Clinical staff conducted group or individual video education with verbal and written material and guidebook.  Patient learns that restaurant meals can sabotage health goals because they are often packed with calories, fat, sodium, and sugar. Recommendations include strategies to plan ahead and to communicate with the manager, chef, or server to help order a healthier meal.  Planning Your Eating Strategy  Clinical staff conducted group or individual video education with verbal and written material and guidebook.  Patient learns about the Pritikin Eating Plan and its benefit of reducing the risk of disease. The Pritikin Eating Plan does not focus on calories. Instead, it emphasizes  high-quality, nutrient-rich foods. By knowing the characteristics of the foods, we choose, we can determine their calorie density and make informed decisions.  Targeting Your Nutrition Priorities  Clinical staff conducted group or individual video education with verbal and written material and guidebook.  Patient learns that lifestyle habits have a tremendous impact on disease risk and progression. This video provides eating and physical activity recommendations based on your personal health goals, such as reducing LDL cholesterol, losing weight, preventing or controlling type 2 diabetes, and reducing high blood pressure.  Vitamins and Minerals  Clinical staff conducted group or individual video education with verbal and written material and guidebook.  Patient learns different ways to obtain key vitamins and minerals, including through a recommended healthy diet. It is important to discuss all supplements you take with your doctor.   Healthy Mind-Set    Smoking Cessation  Clinical staff conducted group or individual video education with verbal and written material and guidebook.  Patient learns that cigarette smoking  and tobacco addiction pose a serious health risk which affects millions of people. Stopping smoking will significantly reduce the risk of heart disease, lung disease, and many forms of cancer. Recommended strategies for quitting are covered, including working with your doctor to develop a successful plan.  Culinary   Becoming a Set designer conducted group or individual video education with verbal and written material and guidebook.  Patient learns that cooking at home can be healthy, cost-effective, quick, and puts them in control. Keys to cooking healthy recipes will include looking at your recipe, assessing your equipment needs, planning ahead, making it simple, choosing cost-effective seasonal ingredients, and limiting the use of added fats, salts, and  sugars.  Cooking - Breakfast and Snacks  Clinical staff conducted group or individual video education with verbal and written material and guidebook.  Patient learns how important breakfast is to satiety and nutrition through the entire day. Recommendations include key foods to eat during breakfast to help stabilize blood sugar levels and to prevent overeating at meals later in the day. Planning ahead is also a key component.  Cooking - Educational psychologist conducted group or individual video education with verbal and written material and guidebook.  Patient learns eating strategies to improve overall health, including an approach to cook more at home. Recommendations include thinking of animal protein as a side on your plate rather than center stage and focusing instead on lower calorie dense options like vegetables, fruits, whole grains, and plant-based proteins, such as beans. Making sauces in large quantities to freeze for later and leaving the skin on your vegetables are also recommended to maximize your experience.  Cooking - Healthy Salads and Dressing Clinical staff conducted group or individual video education with verbal and written material and guidebook.  Patient learns that vegetables, fruits, whole grains, and legumes are the foundations of the Pritikin Eating Plan. Recommendations include how to incorporate each of these in flavorful and healthy salads, and how to create homemade salad dressings. Proper handling of ingredients is also covered. Cooking - Soups and [employer] - Soups and Desserts Clinical staff conducted group or individual video education with verbal and written material and guidebook.  Patient learns that Pritikin soups and desserts make for easy, nutritious, and delicious snacks and meal components that are low in sodium, fat, sugar, and calorie density, while high in vitamins, minerals, and filling fiber. Recommendations include simple and healthy  ideas for soups and desserts.   Overview     The Pritikin Solution Program Overview Clinical staff conducted group or individual video education with verbal and written material and guidebook.  Patient learns that the results of the Pritikin Program have been documented in more than 100 articles published in peer-reviewed journals, and the benefits include reducing risk factors for (and, in some cases, even reversing) high cholesterol, high blood pressure, type 2 diabetes, obesity, and more! An overview of the three key pillars of the Pritikin Program will be covered: eating well, doing regular exercise, and having a healthy mind-set.  WORKSHOPS  Exercise: Exercise Basics: Building Your Action Plan Clinical staff led group instruction and group discussion with PowerPoint presentation and patient guidebook. To enhance the learning environment the use of posters, models and videos may be added. At the conclusion of this workshop, patients will comprehend the difference between physical activity and exercise, as well as the benefits of incorporating both, into their routine. Patients will understand the FITT (Frequency, Intensity, Time, and  Type) principle and how to use it to build an exercise action plan. In addition, safety concerns and other considerations for exercise and cardiac rehab will be addressed by the presenter. The purpose of this lesson is to promote a comprehensive and effective weekly exercise routine in order to improve patients' overall level of fitness.   Managing Heart Disease: Your Path to a Healthier Heart Clinical staff led group instruction and group discussion with PowerPoint presentation and patient guidebook. To enhance the learning environment the use of posters, models and videos may be added.At the conclusion of this workshop, patients will understand the anatomy and physiology of the heart. Additionally, they will understand how Pritikin's three pillars impact the  risk factors, the progression, and the management of heart disease.  The purpose of this lesson is to provide a high-level overview of the heart, heart disease, and how the Pritikin lifestyle positively impacts risk factors.  Exercise Biomechanics Clinical staff led group instruction and group discussion with PowerPoint presentation and patient guidebook. To enhance the learning environment the use of posters, models and videos may be added. Patients will learn how the structural parts of their bodies function and how these functions impact their daily activities, movement, and exercise. Patients will learn how to promote a neutral spine, learn how to manage pain, and identify ways to improve their physical movement in order to promote healthy living. The purpose of this lesson is to expose patients to common physical limitations that impact physical activity. Participants will learn practical ways to adapt and manage aches and pains, and to minimize their effect on regular exercise. Patients will learn how to maintain good posture while sitting, walking, and lifting.  Balance Training and Fall Prevention  Clinical staff led group instruction and group discussion with PowerPoint presentation and patient guidebook. To enhance the learning environment the use of posters, models and videos may be added. At the conclusion of this workshop, patients will understand the importance of their sensorimotor skills (vision, proprioception, and the vestibular system) in maintaining their ability to balance as they age. Patients will apply a variety of balancing exercises that are appropriate for their current level of function. Patients will understand the common causes for poor balance, possible solutions to these problems, and ways to modify their physical environment in order to minimize their fall risk. The purpose of this lesson is to teach patients about the importance of maintaining balance as they age  and ways to minimize their risk of falling.  WORKSHOPS   Nutrition:  Fueling a Ship broker led group instruction and group discussion with PowerPoint presentation and patient guidebook. To enhance the learning environment the use of posters, models and videos may be added. Patients will review the foundational principles of the Pritikin Eating Plan and understand what constitutes a serving size in each of the food groups. Patients will also learn Pritikin-friendly foods that are better choices when away from home and review make-ahead meal and snack options. Calorie density will be reviewed and applied to three nutrition priorities: weight maintenance, weight loss, and weight gain. The purpose of this lesson is to reinforce (in a group setting) the key concepts around what patients are recommended to eat and how to apply these guidelines when away from home by planning and selecting Pritikin-friendly options. Patients will understand how calorie density may be adjusted for different weight management goals.  Mindful Eating  Clinical staff led group instruction and group discussion with PowerPoint presentation and patient guidebook. To  enhance the learning environment the use of posters, models and videos may be added. Patients will briefly review the concepts of the Pritikin Eating Plan and the importance of low-calorie dense foods. The concept of mindful eating will be introduced as well as the importance of paying attention to internal hunger signals. Triggers for non-hunger eating and techniques for dealing with triggers will be explored. The purpose of this lesson is to provide patients with the opportunity to review the basic principles of the Pritikin Eating Plan, discuss the value of eating mindfully and how to measure internal cues of hunger and fullness using the Hunger Scale. Patients will also discuss reasons for non-hunger eating and learn strategies to use for controlling  emotional eating.  Targeting Your Nutrition Priorities Clinical staff led group instruction and group discussion with PowerPoint presentation and patient guidebook. To enhance the learning environment the use of posters, models and videos may be added. Patients will learn how to determine their genetic susceptibility to disease by reviewing their family history. Patients will gain insight into the importance of diet as part of an overall healthy lifestyle in mitigating the impact of genetics and other environmental insults. The purpose of this lesson is to provide patients with the opportunity to assess their personal nutrition priorities by looking at their family history, their own health history and current risk factors. Patients will also be able to discuss ways of prioritizing and modifying the Pritikin Eating Plan for their highest risk areas  Menu  Clinical staff led group instruction and group discussion with PowerPoint presentation and patient guidebook. To enhance the learning environment the use of posters, models and videos may be added. Using menus brought in from [employer], or printed from [employer], patients will apply the Pritikin dining out guidelines that were presented in the [employer] video. Patients will also be able to practice these guidelines in a variety of provided scenarios. The purpose of this lesson is to provide patients with the opportunity to practice hands-on learning of the Pritikin Dining Out guidelines with actual menus and practice scenarios.  Label Reading Clinical staff led group instruction and group discussion with PowerPoint presentation and patient guidebook. To enhance the learning environment the use of posters, models and videos may be added. Patients will review and discuss the Pritikin label reading guidelines presented in Pritikin's Label Reading Educational series video. Using fool labels brought in from local grocery stores  and markets, patients will apply the label reading guidelines and determine if the packaged food meet the Pritikin guidelines. The purpose of this lesson is to provide patients with the opportunity to review, discuss, and practice hands-on learning of the Pritikin Label Reading guidelines with actual packaged food labels. Cooking School  Pritikin's [employer] are designed to teach patients ways to prepare quick, simple, and affordable recipes at home. The importance of nutrition's role in chronic disease risk reduction is reflected in its emphasis in the overall Pritikin program. By learning how to prepare essential core Pritikin Eating Plan recipes, patients will increase control over what they eat; be able to customize the flavor of foods without the use of added salt, sugar, or fat; and improve the quality of the food they consume. By learning a set of core recipes which are easily assembled, quickly prepared, and affordable, patients are more likely to prepare more healthy foods at home. These workshops focus on convenient breakfasts, simple entres, side dishes, and desserts which can be prepared with minimal effort  and are consistent with nutrition recommendations for cardiovascular risk reduction. Cooking [employer] are taught by a Armed forces logistics/support/administrative officer (RD) who has been trained by the [employer]. The chef or RD has a clear understanding of the importance of minimizing - if not completely eliminating - added fat, sugar, and sodium in recipes. Throughout the series of Cooking School Workshop sessions, patients will learn about healthy ingredients and efficient methods of cooking to build confidence in their capability to prepare    Cooking School weekly topics:  Adding Flavor- Sodium-Free  Fast and Healthy Breakfasts  Powerhouse Plant-Based Proteins  Satisfying Salads and Dressings  Simple Sides and Sauces  International Cuisine-Spotlight on the [employer]  Zones  Delicious Desserts  Savory Soups  [employer] - Meals in a Astronomer Appetizers and Snacks  Comforting Weekend Breakfasts  One-Pot Wonders   Fast Evening Meals  Landscape architect Your Pritikin Plate  WORKSHOPS   Healthy Mindset (Psychosocial):  Focused Goals, Sustainable Changes Clinical staff led group instruction and group discussion with PowerPoint presentation and patient guidebook. To enhance the learning environment the use of posters, models and videos may be added. Patients will be able to apply effective goal setting strategies to establish at least one personal goal, and then take consistent, meaningful action toward that goal. They will learn to identify common barriers to achieving personal goals and develop strategies to overcome them. Patients will also gain an understanding of how our mind-set can impact our ability to achieve goals and the importance of cultivating a positive and growth-oriented mind-set. The purpose of this lesson is to provide patients with a deeper understanding of how to set and achieve personal goals, as well as the tools and strategies needed to overcome common obstacles which may arise along the way.  From Head to Heart: The Power of a Healthy Outlook  Clinical staff led group instruction and group discussion with PowerPoint presentation and patient guidebook. To enhance the learning environment the use of posters, models and videos may be added. Patients will be able to recognize and describe the impact of emotions and mood on physical health. They will discover the importance of self-care and explore self-care practices which may work for them. Patients will also learn how to utilize the 4 C's to cultivate a healthier outlook and better manage stress and challenges. The purpose of this lesson is to demonstrate to patients how a healthy outlook is an essential part of maintaining good health, especially as they continue their  cardiac rehab journey.  Healthy Sleep for a Healthy Heart Clinical staff led group instruction and group discussion with PowerPoint presentation and patient guidebook. To enhance the learning environment the use of posters, models and videos may be added. At the conclusion of this workshop, patients will be able to demonstrate knowledge of the importance of sleep to overall health, well-being, and quality of life. They will understand the symptoms of, and treatments for, common sleep disorders. Patients will also be able to identify daytime and nighttime behaviors which impact sleep, and they will be able to apply these tools to help manage sleep-related challenges. The purpose of this lesson is to provide patients with a general overview of sleep and outline the importance of quality sleep. Patients will learn about a few of the most common sleep disorders. Patients will also be introduced to the concept of "sleep hygiene," and discover ways to self-manage certain sleeping problems through simple daily behavior changes. Finally, the  workshop will motivate patients by clarifying the links between quality sleep and their goals of heart-healthy living.   Recognizing and Reducing Stress Clinical staff led group instruction and group discussion with PowerPoint presentation and patient guidebook. To enhance the learning environment the use of posters, models and videos may be added. At the conclusion of this workshop, patients will be able to understand the types of stress reactions, differentiate between acute and chronic stress, and recognize the impact that chronic stress has on their health. They will also be able to apply different coping mechanisms, such as reframing negative self-talk. Patients will have the opportunity to practice a variety of stress management techniques, such as deep abdominal breathing, progressive muscle relaxation, and/or guided imagery.  The purpose of this lesson is to educate  patients on the role of stress in their lives and to provide healthy techniques for coping with it.  Learning Barriers/Preferences:  Learning Barriers/Preferences - 06/02/24 1534       Learning Barriers/Preferences   Learning Barriers Sight    Learning Preferences Audio;Computer/Internet;Group Instruction;Individual Instruction;Skilled Demonstration;Verbal Instruction;Video;Written Material;Pictoral          Education Topics:  Knowledge Questionnaire Score:  Knowledge Questionnaire Score - 06/02/24 1535       Knowledge Questionnaire Score   Pre Score 23/24          Core Components/Risk Factors/Patient Goals at Admission:  Personal Goals and Risk Factors at Admission - 06/02/24 1535       Core Components/Risk Factors/Patient Goals on Admission    Weight Management Yes;Obesity;Weight Loss    Intervention Weight Management: Develop a combined nutrition and exercise program designed to reach desired caloric intake, while maintaining appropriate intake of nutrient and fiber, sodium and fats, and appropriate energy expenditure required for the weight goal.;Weight Management: Provide education and appropriate resources to help participant work on and attain dietary goals.;Weight Management/Obesity: Establish reasonable short term and long term weight goals.;Obesity: Provide education and appropriate resources to help participant work on and attain dietary goals.    Goal Weight: Long Term 168 lb (76.2 kg)   pt goal   Hypertension Yes    Intervention Provide education on lifestyle modifcations including regular physical activity/exercise, weight management, moderate sodium restriction and increased consumption of fresh fruit, vegetables, and low fat dairy, alcohol moderation, and smoking cessation.;Monitor prescription use compliance.    Expected Outcomes Short Term: Continued assessment and intervention until BP is < 140/46mm HG in hypertensive participants. < 130/61mm HG in hypertensive  participants with diabetes, heart failure or chronic kidney disease.;Long Term: Maintenance of blood pressure at goal levels.    Lipids Yes    Intervention Provide education and support for participant on nutrition & aerobic/resistive exercise along with prescribed medications to achieve LDL 70mg , HDL >40mg .    Expected Outcomes Short Term: Participant states understanding of desired cholesterol values and is compliant with medications prescribed. Participant is following exercise prescription and nutrition guidelines.;Long Term: Cholesterol controlled with medications as prescribed, with individualized exercise RX and with personalized nutrition plan. Value goals: LDL < 70mg , HDL > 40 mg.    Stress Yes    Intervention Refer participants experiencing significant psychosocial distress to appropriate mental health specialists for further evaluation and treatment. When possible, include family members and significant others in education/counseling sessions.;Offer individual and/or small group education and counseling on adjustment to heart disease, stress management and health-related lifestyle change. Teach and support self-help strategies.    Expected Outcomes Short Term: Participant demonstrates changes in health-related behavior, relaxation  and other stress management skills, ability to obtain effective social support, and compliance with psychotropic medications if prescribed.;Long Term: Emotional wellbeing is indicated by absence of clinically significant psychosocial distress or social isolation.          Core Components/Risk Factors/Patient Goals Review:    Core Components/Risk Factors/Patient Goals at Discharge (Final Review):    ITP Comments:  ITP Comments     Row Name 06/02/24 1312           ITP Comments Dr. Wilbert Bihari medical director. Introduction to pritikin education/intensive cardiac rehab. Initial orientation packet reviewed with patient.          Comments: Participant  attended orientation for the cardiac rehabilitation program on  06/02/2024  to perform initial intake and exercise walk test. Patient introduced to the [facility] education and orientation packet was reviewed. Completed 6-minute walk test, measurements, initial ITP, and exercise prescription. Vital signs stable. Telemetry-normal sinus rhythm, asymptomatic.   Service time was from 1313 to 1500.  Con [name], MS, ACSM-CEP 06/02/2024 3:36 PM

## 2024-06-08 ENCOUNTER — Encounter (HOSPITAL_COMMUNITY)
Admission: RE | Admit: 2024-06-08 | Discharge: 2024-06-08 | Disposition: A | Discharge status: Home / Self Care | Source: Ambulatory Visit | Attending: Cardiology | Admitting: Cardiology

## 2024-06-08 DIAGNOSIS — I252 Old myocardial infarction: Secondary | ICD-10-CM | POA: Diagnosis not present

## 2024-06-08 DIAGNOSIS — M542 Cervicalgia: Secondary | ICD-10-CM | POA: Diagnosis present

## 2024-06-08 DIAGNOSIS — Z48812 Encounter for surgical aftercare following surgery on the circulatory system: Secondary | ICD-10-CM | POA: Insufficient documentation

## 2024-06-08 DIAGNOSIS — I214 Non-ST elevation (NSTEMI) myocardial infarction: Secondary | ICD-10-CM

## 2024-06-08 DIAGNOSIS — Z955 Presence of coronary angioplasty implant and graft: Secondary | ICD-10-CM | POA: Diagnosis present

## 2024-06-08 NOTE — Progress Notes (Signed)
 Daily Session Note  Patient Details  Name: Kathy Stephens MRN: [id number] Date of Birth: Feb 10, 1961 Referring Provider:   Flowsheet Row INTENSIVE CARDIAC REHAB ORIENT from 06/02/2024 in [facility]  Referring Provider Newman Lawrence, MD    Encounter Date: 06/08/2024  Check In:  Session Check In - 06/08/24 1010       Check-In   Supervising physician immediately available to respond to emergencies [facility] MD immediately available    Physician(s) Orren Fabry, PA-C    Location [facility]    Staff Present Con Pereyra, MS, Exercise Physiologist;Jetta Vannie BS, ACSM-CEP, Exercise Physiologist;Nashly Olsson, RN, Mallory Parkins, MS, ACSM-CEP, CCRP, Exercise Physiologist;Olinty Valere, MS, ACSM-CEP, Exercise Physiologist    Virtual Visit No    Medication changes reported     No    Fall or balance concerns reported    No    Tobacco Cessation No Change    Warm-up and Cool-down Performed as group-led instruction    Resistance Training Performed No    VAD Patient? No    PAD/SET Patient? No      Pain Assessment   Currently in Pain? No/denies    Pain Score 0-No pain    Multiple Pain Sites No          Capillary Blood Glucose: No results found for this or any previous visit (from the past 24 hours).   Exercise Prescription Changes - 06/08/24 1021       Response to Exercise   Blood Pressure (Admit) 114/66    Blood Pressure (Exercise) 150/62    Blood Pressure (Exit) 100/58    Heart Rate (Admit) 67 bpm    Heart Rate (Exercise) 92 bpm    Heart Rate (Exit) 62 bpm    Rating of Perceived Exertion (Exercise) 12    Symptoms None    Comments Off to a good start with exercise.    Duration Progress to 30 minutes of  aerobic without signs/symptoms of physical distress    Intensity THRR unchanged      Progression   Progression Continue to progress workloads to maintain intensity without signs/symptoms of  physical distress.    Average METs 2.6      Resistance Training   Training Prescription No    Weight Relaxation day, no weights.      Interval Training   Interval Training No      NuStep   Level 1    SPM 91    Minutes 27    METs 2.6          Social History   Tobacco Use  Smoking Status Every Day   Types: Cigars   Passive exposure: Current  Smokeless Tobacco Never  Tobacco Comments   5 daily    Goals Met:  No report of concerns or symptoms today  Goals Unmet:  Not Applicable  Comments: Pt started cardiac rehab today.  Pt tolerated light exercise without difficulty. VSS, telemetry-Sinus Rhythm tall t wave, asymptomatic.  Medication list reconciled. Pt denies barriers to medicaiton compliance.  PSYCHOSOCIAL ASSESSMENT:  PHQ-3. Will review PH9 in the upcoming week. Kathy Stephens said that she was supposed to have hip surgery before she had has MI in June. Kathy Stephens did not complain of hip pain on her first day of exercise.  Pt enjoys gardening, spending time with her dog, playing bingo and reading..   Pt oriented to exercise equipment and routine.    Understanding verbalized.Kathy Elpidio Quan  RN BSN    Dr. Wilbert Bihari is Medical Director for Cardiac Rehab at [facility].

## 2024-06-10 ENCOUNTER — Encounter (HOSPITAL_COMMUNITY)
Admission: RE | Admit: 2024-06-10 | Discharge: 2024-06-10 | Disposition: A | Discharge status: Home / Self Care | Source: Ambulatory Visit | Attending: Cardiology | Admitting: Cardiology

## 2024-06-10 DIAGNOSIS — Z955 Presence of coronary angioplasty implant and graft: Secondary | ICD-10-CM

## 2024-06-10 DIAGNOSIS — I214 Non-ST elevation (NSTEMI) myocardial infarction: Secondary | ICD-10-CM

## 2024-06-10 DIAGNOSIS — Z48812 Encounter for surgical aftercare following surgery on the circulatory system: Secondary | ICD-10-CM | POA: Diagnosis not present

## 2024-06-13 ENCOUNTER — Encounter (HOSPITAL_COMMUNITY)
Admission: RE | Admit: 2024-06-13 | Discharge: 2024-06-13 | Disposition: A | Discharge status: Home / Self Care | Source: Ambulatory Visit | Attending: Cardiology | Admitting: Cardiology

## 2024-06-13 DIAGNOSIS — Z48812 Encounter for surgical aftercare following surgery on the circulatory system: Secondary | ICD-10-CM | POA: Diagnosis not present

## 2024-06-13 DIAGNOSIS — I214 Non-ST elevation (NSTEMI) myocardial infarction: Secondary | ICD-10-CM

## 2024-06-13 DIAGNOSIS — Z955 Presence of coronary angioplasty implant and graft: Secondary | ICD-10-CM

## 2024-06-14 NOTE — Progress Notes (Signed)
 Cardiac Individual Treatment Plan  Patient Details  Name: Kathy Stephens MRN: [id number] Date of Birth: 1960/12/05 Referring Provider:   Flowsheet Row INTENSIVE CARDIAC REHAB ORIENT from 06/02/2024 in [facility]  Referring Provider Newman Lawrence, MD    Initial Encounter Date:  Flowsheet Row INTENSIVE CARDIAC REHAB ORIENT from 06/02/2024 in [facility]  Date 06/02/24    Visit Diagnosis: 05/02/24 NSTEMI (non-ST elevated myocardial infarction) (HCC)  05/02/24 DES LAD  Patient's Home Medications on Admission:  Current Outpatient Medications:    acetaminophen  (TYLENOL ) 500 MG tablet, Take 500-1,000 mg by mouth every 6 (six) hours as needed for moderate pain (pain score 4-6)., Disp: , Rfl:    aspirin  EC 81 MG tablet, Take 1 tablet (81 mg total) by mouth daily. Swallow whole., Disp: 30 tablet, Rfl: 12   diphenhydrAMINE (BENADRYL) 25 mg capsule, Take 25 mg by mouth at bedtime., Disp: , Rfl:    famotidine  (PEPCID ) 20 MG tablet, Take 20 mg by mouth daily., Disp: , Rfl:    lisinopril  (ZESTRIL ) 10 MG tablet, Take 2 tablets (20 mg total) by mouth daily., Disp: 180 tablet, Rfl: 3   loperamide (IMODIUM A-D) 2 MG tablet, Take 2 mg by mouth daily., Disp: , Rfl:    nitroGLYCERIN  (NITROSTAT ) 0.4 MG SL tablet, Place 1 tablet (0.4 mg total) under the tongue every 5 (five) minutes as needed for chest pain for up to 3 doses., Disp: 100 tablet, Rfl: 3   rosuvastatin  (CRESTOR ) 10 MG tablet, Take 1 tablet (10 mg total) by mouth daily., Disp: 90 tablet, Rfl: 3   Study - ARTEMIS - ziltivekimab 15 mg/0.5 mL or placebo SQ injection (PI-Christopher), Inject 0.5 mLs (15 mg total) into the skin every 28 (twenty-eight) days. For Investigational Use Only. Bring boxes and pens back to research visits., Disp: 0.5 mL, Rfl: 0   ticagrelor  (BRILINTA ) 90 MG TABS tablet, Take 1 tablet (90 mg total) by mouth 2  (two) times daily., Disp: 180 tablet, Rfl: 3  Past Medical History: Past Medical History:  Diagnosis Date   Allergy    Anemia 06/02/2013   Anxiety 11/05/2011   Avascular necrosis of femur, left (HCC) 03/19/2022   Cancer (HCC) 06/02/2013   skin (nose & face)   Depression 11/05/2011   Fibromyalgia 04/09/2021   GERD (gastroesophageal reflux disease) 11/05/2011   Hyperlipidemia 02/03/2013   Hypertension 04/09/2021   Inflammatory bowel disease (ulcerative colitis) (HCC) 10/08/2017   Migraines 06/02/2013   while on Liada (mesalazine), not on it at this time   Osteopenia 06/02/2013   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   TIA (transient ischemic attack) 05/13/2023    Tobacco Use: Social History   Tobacco Use  Smoking Status Every Day   Types: Cigars   Passive exposure: Current  Smokeless Tobacco Never  Tobacco Comments   5 daily    Labs: Review Flowsheet  More data exists      Latest Ref Rng & Units 11/27/2017 03/29/2021 05/12/2023 07/02/2023 05/02/2024  Labs for ITP Cardiac and Pulmonary Rehab  Cholestrol 0 - 200 mg/dL 755  740  - 827  779   LDL (calc) 0 - 99 mg/dL 840  830  - 91  860   HDL-C >40 mg/dL 55  56  - 55  58   Trlycerides <150 mg/dL 849  814  - 849  885   Hemoglobin A1c 4.8 - 5.6 % - - -  5.6  -  TCO2 22 - 32 mmol/L - - 26  - -    Capillary Blood Glucose: No results found for: GLUCAP   Exercise Target Goals: Exercise Program Goal: Individual exercise prescription set using results from initial 6 min walk test and THRR while considering  patient's activity barriers and safety.   Exercise Prescription Goal: Initial exercise prescription builds to 30-45 minutes a day of aerobic activity, 2-3 days per week.  Home exercise guidelines will be given to patient during program as part of exercise prescription that the participant will acknowledge.  Activity Barriers & Risk Stratification:  Activity Barriers & Cardiac Risk Stratification - 06/02/24 1429       Activity  Barriers & Cardiac Risk Stratification   Activity Barriers Arthritis;Balance Concerns;Shortness of Breath;Deconditioning;Assistive Device    Cardiac Risk Stratification High   <5 METs on         6 Minute Walk:  6 Minute Walk     Row Name 06/02/24 1531         6 Minute Walk   Phase Initial     Distance 1245 feet     Walk Time 6 minutes     # of Rest Breaks 0     MPH 2.36     METS 3.14     RPE 12     Perceived Dyspnea  1     VO2 Peak 10.98     Symptoms Yes (comment)     Comments 6/10 L chronic calf pain. SOB. Both resolved with rest     Resting HR 61 bpm     Resting BP 116/72     Resting Oxygen Saturation  98 %     Exercise Oxygen Saturation  during 6 min walk 99 %     Max Ex. HR 99 bpm     Max Ex. BP 152/68     2 Minute Post BP 124/64        Oxygen Initial Assessment:   Oxygen Re-Evaluation:   Oxygen Discharge (Final Oxygen Re-Evaluation):   Initial Exercise Prescription:  Initial Exercise Prescription - 06/02/24 1500       Date of Initial Exercise RX and Referring Provider   Date 06/02/24    Referring Provider Newman Lawrence, MD    Expected Discharge Date 08/24/24      NuStep   Level 1    SPM 70    Minutes 15    METs 2      Prescription Details   Frequency (times per week) 3    Duration Progress to 30 minutes of continuous aerobic without signs/symptoms of physical distress      Intensity   THRR 40-80% of Max Heartrate 58-115    Ratings of Perceived Exertion 11-13    Perceived Dyspnea 0-4      Progression   Progression Continue progressive overload as per policy without signs/symptoms or physical distress.      Resistance Training   Training Prescription Yes    Weight 2    Reps 10-15          Perform Capillary Blood Glucose checks as needed.  Exercise Prescription Changes:   Exercise Prescription Changes     Row Name 06/08/24 1021             Response to Exercise   Blood Pressure (Admit) 114/66       Blood  Pressure (Exercise) 150/62       Blood Pressure (Exit) 100/58  Heart Rate (Admit) 67 bpm       Heart Rate (Exercise) 92 bpm       Heart Rate (Exit) 62 bpm       Rating of Perceived Exertion (Exercise) 12       Symptoms None       Comments Off to a good start with exercise.       Duration Progress to 30 minutes of  aerobic without signs/symptoms of physical distress       Intensity THRR unchanged         Progression   Progression Continue to progress workloads to maintain intensity without signs/symptoms of physical distress.       Average METs 2.6         Resistance Training   Training Prescription No       Weight Relaxation day, no weights.         Interval Training   Interval Training No         NuStep   Level 1       SPM 91       Minutes 27       METs 2.6          Exercise Comments:   Exercise Comments     Row Name 06/08/24 1121           Exercise Comments Shavaughn tolerated low intensity exercise well without symptoms. Oriented her to the exercise equipment and stretching routine.          Exercise Goals and Review:   Exercise Goals     Row Name 06/02/24 1429             Exercise Goals   Increase Physical Activity Yes       Intervention Provide advice, education, support and counseling about physical activity/exercise needs.;Develop an individualized exercise prescription for aerobic and resistive training based on initial evaluation findings, risk stratification, comorbidities and participant's personal goals.       Expected Outcomes Short Term: Attend rehab on a regular basis to increase amount of physical activity.;Long Term: Exercising regularly at least 3-5 days a week.;Long Term: Add in home exercise to make exercise part of routine and to increase amount of physical activity.       Increase Strength and Stamina Yes       Intervention Provide advice, education, support and counseling about physical activity/exercise needs.;Develop an  individualized exercise prescription for aerobic and resistive training based on initial evaluation findings, risk stratification, comorbidities and participant's personal goals.       Expected Outcomes Short Term: Increase workloads from initial exercise prescription for resistance, speed, and METs.;Short Term: Perform resistance training exercises routinely during rehab and add in resistance training at home;Long Term: Improve cardiorespiratory fitness, muscular endurance and strength as measured by increased METs and functional capacity ( )       Able to understand and use rate of perceived exertion (RPE) scale Yes       Intervention Provide education and explanation on how to use RPE scale       Expected Outcomes Short Term: Able to use RPE daily in rehab to express subjective intensity level;Long Term:  Able to use RPE to guide intensity level when exercising independently       Knowledge and understanding of Target Heart Rate Range (THRR) Yes       Intervention Provide education and explanation of THRR including how the numbers were predicted and where they are located for reference  Expected Outcomes Short Term: Able to state/look up THRR;Long Term: Able to use THRR to govern intensity when exercising independently;Short Term: Able to use daily as guideline for intensity in rehab       Understanding of Exercise Prescription Yes       Intervention Provide education, explanation, and written materials on patient's individual exercise prescription       Expected Outcomes Short Term: Able to explain program exercise prescription;Long Term: Able to explain home exercise prescription to exercise independently          Exercise Goals Re-Evaluation :  Exercise Goals Re-Evaluation     Row Name 06/08/24 1021             Exercise Goal Re-Evaluation   Exercise Goals Review Increase Physical Activity;Increase Strength and Stamina;Able to understand and use rate of perceived exertion (RPE)  scale       Comments Bintou was able to understand and use RPE scale appropriately. She is awaiting hip surgery. She tolerated the recumbent stepper for 27 minutes without issue.       Expected Outcomes Progress workloads as tolerated.          Discharge Exercise Prescription (Final Exercise Prescription Changes):  Exercise Prescription Changes - 06/08/24 1021       Response to Exercise   Blood Pressure (Admit) 114/66    Blood Pressure (Exercise) 150/62    Blood Pressure (Exit) 100/58    Heart Rate (Admit) 67 bpm    Heart Rate (Exercise) 92 bpm    Heart Rate (Exit) 62 bpm    Rating of Perceived Exertion (Exercise) 12    Symptoms None    Comments Off to a good start with exercise.    Duration Progress to 30 minutes of  aerobic without signs/symptoms of physical distress    Intensity THRR unchanged      Progression   Progression Continue to progress workloads to maintain intensity without signs/symptoms of physical distress.    Average METs 2.6      Resistance Training   Training Prescription No    Weight Relaxation day, no weights.      Interval Training   Interval Training No      NuStep   Level 1    SPM 91    Minutes 27    METs 2.6          Nutrition:  Target Goals: Understanding of nutrition guidelines, daily intake of sodium 1500mg , cholesterol 200mg , calories 30% from fat and 7% or less from saturated fats, daily to have 5 or more servings of fruits and vegetables.  Biometrics:  Pre Biometrics - 06/02/24 1428       Pre Biometrics   Waist Circumference 39.5 inches    Hip Circumference 47 inches    Waist to Hip Ratio 0.84 %    Triceps Skinfold 32 mm    % Body Fat 43.3 %    Grip Strength 10 kg    Flexibility --   not done- hip issues   Single Leg Stand 16.56 seconds           Nutrition Therapy Plan and Nutrition Goals:  Nutrition Therapy & Goals - 06/08/24 1112       Nutrition Therapy   Diet Heart Healthy Diet    Drug/Food Interactions  Statins/Certain Fruits      Personal Nutrition Goals   Nutrition Goal Patient to identify strategies for reducing cardiovascular risk by attending the Pritikin education and nutrition series weekly.  Personal Goal #2 Patient to improve diet quality by using the plate method as a guide for meal planning to include lean protein/plant protein, fruits, vegetables, whole grains, nonfat dairy as part of a well-balanced diet.    Comments Patient has medical history of HTN, HLD, tobacco use, CAD, history of multiple TIAs, splenic infarct, NSTEMI. LDL is not at goal; she continues crestor . Patient will benefit from participation in intensive cardiac rehab for nutrition education, exercise, and lifestyle modification.      Intervention Plan   Intervention Prescribe, educate and counsel regarding individualized specific dietary modifications aiming towards targeted core components such as weight, hypertension, lipid management, diabetes, heart failure and other comorbidities.;Nutrition handout(s) given to patient.    Expected Outcomes Short Term Goal: Understand basic principles of dietary content, such as calories, fat, sodium, cholesterol and nutrients.;Long Term Goal: Adherence to prescribed nutrition plan.          Nutrition Assessments:  Nutrition Assessments - 06/10/24 1339       Rate Your Plate Scores   Pre Score 49         MEDIFICTS Score Key: >=70 Need to make dietary changes  40-70 Heart Healthy Diet <= 40 Therapeutic Level Cholesterol Diet   Flowsheet Row INTENSIVE CARDIAC REHAB from 06/10/2024 in [facility]  Picture Your Plate Total Score on Admission 49   Picture Your Plate Scores: <59 Unhealthy dietary pattern with much room for improvement. 41-50 Dietary pattern unlikely to meet recommendations for good health and room for improvement. 51-60 More healthful dietary pattern, with some room for improvement.  >60 Healthy  dietary pattern, although there may be some specific behaviors that could be improved.    Nutrition Goals Re-Evaluation:  Nutrition Goals Re-Evaluation     Row Name 06/08/24 1112             Goals   Current Weight 173 lb 1 oz (78.5 kg)       Comment choleserol 220, LDL 139, HDL 58       Expected Outcome Patient has medical history of HTN, HLD, tobacco use, CAD, history of multiple TIAs, splenic infarct, NSTEMI. LDL is not at goal; she continues crestor . Patient will benefit from participation in intensive cardiac rehab for nutrition education, exercise, and lifestyle modification.          Nutrition Goals Re-Evaluation:  Nutrition Goals Re-Evaluation     Row Name 06/08/24 1112             Goals   Current Weight 173 lb 1 oz (78.5 kg)       Comment choleserol 220, LDL 139, HDL 58       Expected Outcome Patient has medical history of HTN, HLD, tobacco use, CAD, history of multiple TIAs, splenic infarct, NSTEMI. LDL is not at goal; she continues crestor . Patient will benefit from participation in intensive cardiac rehab for nutrition education, exercise, and lifestyle modification.          Nutrition Goals Discharge (Final Nutrition Goals Re-Evaluation):  Nutrition Goals Re-Evaluation - 06/08/24 1112       Goals   Current Weight 173 lb 1 oz (78.5 kg)    Comment choleserol 220, LDL 139, HDL 58    Expected Outcome Patient has medical history of HTN, HLD, tobacco use, CAD, history of multiple TIAs, splenic infarct, NSTEMI. LDL is not at goal; she continues crestor . Patient will benefit from participation in intensive cardiac rehab for nutrition education,  exercise, and lifestyle modification.          Psychosocial: Target Goals: Acknowledge presence or absence of significant depression and/or stress, maximize coping skills, provide positive support system. Participant is able to verbalize types and ability to use techniques and skills needed for reducing stress and  depression.  Initial Review & Psychosocial Screening:  Initial Psych Review & Screening - 06/02/24 1430       Initial Review   Current issues with Current Anxiety/Panic      Family Dynamics   Good Support System? Yes   husband/friends   Comments Lydian shared that she has had some anxiety since her MI. She shared that she has fear that something else will happen and that on a recent flight to see family she experienced 2 panic attacks. She denies any depression with this. Support offered, Kajal shared that she does not need any additional support or resources at this time. She has friends and family who are helping her.      Barriers   Psychosocial barriers to participate in program The patient should benefit from training in stress management and relaxation.;Psychosocial barriers identified (see note)      Screening Interventions   Interventions Provide feedback about the scores to participant;To provide support and resources with identified psychosocial needs;Encouraged to exercise    Expected Outcomes Long Term goal: The participant improves quality of Life and PHQ9 Scores as seen by post scores and/or verbalization of changes;Short Term goal: Identification and review with participant of any Quality of Life or Depression concerns found by scoring the questionnaire.;Long Term Goal: Stressors or current issues are controlled or eliminated.          Quality of Life Scores:  Quality of Life - 06/02/24 1534       Quality of Life   Select Quality of Life      Quality of Life Scores   Health/Function Pre 17.17 %    Socioeconomic Pre 24.07 %    Psych/Spiritual Pre 19.92 %    Family Pre 24.4 %    GLOBAL Pre 20.23 %         Scores of 19 and below usually indicate a poorer quality of life in these areas.  A difference of  2-3 points is a clinically meaningful difference.  A difference of 2-3 points in the total score of the Quality of Life Index has been associated with significant  improvement in overall quality of life, self-image, physical symptoms, and general health in studies assessing change in quality of life.  PHQ-9: Review Flowsheet  More data exists      06/02/2024 05/09/2024 03/21/2024 10/13/2023 09/08/2023  Depression screen PHQ 2/9  Decreased Interest 0 0 0 0 0  Down, Depressed, Hopeless 0 0 0 0 0  PHQ - 2 Score 0 0 0 0 0  Altered sleeping 2 1 0 0 0  Tired, decreased energy 1 1 0 0 0  Change in appetite 0 0 0 0 0  Feeling bad or failure about yourself  0 0 0 0 0  Trouble concentrating 0 1 0 0 0  Moving slowly or fidgety/restless 0 0 0 0 0  Suicidal thoughts 0 0 0 0 0  PHQ-9 Score 3 3 0 0 0  Difficult doing work/chores Not difficult at all - - Not difficult at all Not difficult at all   Interpretation of Total Score  Total Score Depression Severity:  1-4 = Minimal depression, 5-9 = Mild depression, 10-14 = Moderate  depression, 15-19 = Moderately severe depression, 20-27 = Severe depression   Psychosocial Evaluation and Intervention:   Psychosocial Re-Evaluation:  Psychosocial Re-Evaluation     Row Name 06/08/24 1337             Psychosocial Re-Evaluation   Current issues with Current Anxiety/Panic;Current Stress Concerns       Comments Jenavee did mention that she was supposed to have hip surgery before she had her MI. Will review quality of life in the upcoming week       Expected Outcomes Inga will have controlled or decreased stressors/ anxiety upon compleiton of cardiac rehab       Interventions Stress management education;Relaxation education;Encouraged to attend Cardiac Rehabilitation for the exercise       Continue Psychosocial Services  Follow up required by staff         Initial Review   Source of Stress Concerns Chronic Illness       Comments will continue to monitor and offer support as needed.          Psychosocial Discharge (Final Psychosocial Re-Evaluation):  Psychosocial Re-Evaluation - 06/08/24 1337        Psychosocial Re-Evaluation   Current issues with Current Anxiety/Panic;Current Stress Concerns    Comments Liz did mention that she was supposed to have hip surgery before she had her MI. Will review quality of life in the upcoming week    Expected Outcomes Tam will have controlled or decreased stressors/ anxiety upon compleiton of cardiac rehab    Interventions Stress management education;Relaxation education;Encouraged to attend Cardiac Rehabilitation for the exercise    Continue Psychosocial Services  Follow up required by staff      Initial Review   Source of Stress Concerns Chronic Illness    Comments will continue to monitor and offer support as needed.          Vocational Rehabilitation: Provide vocational rehab assistance to qualifying candidates.   Vocational Rehab Evaluation & Intervention:  Vocational Rehab - 06/02/24 1535       Initial Vocational Rehab Evaluation & Intervention   Assessment shows need for Vocational Rehabilitation No   retired         Education: Education Goals: Education classes will be provided on a weekly basis, covering required topics. Participant will state understanding/return demonstration of topics presented.    Education     Row Name 06/08/24 1000     Education   Cardiac Education Topics Pritikin   Orthoptist   Educator Dietitian   Weekly Topic Fast and Healthy Breakfasts   Instruction Review Code 1- Verbalizes Understanding    Row Name 06/10/24 1000     Education   Cardiac Education Topics Pritikin   Select Core Videos     Core Videos   Educator Dietitian   Select Nutrition   Nutrition Other   Instruction Review Code 1- Verbalizes Understanding   Class Start Time 1145   Class Stop Time 1225   Class Time Calculation (min) 40 min    Row Name 06/13/24 1100     Education   Cardiac Education Topics Pritikin   Careers information officer Education   General Education Metabolic Syndrome and Belly Fat   Instruction Review Code 1- Verbalizes Understanding   Class Start Time 1200   Class Stop Time 1235   Class Time Calculation (min) 35 min  Core Videos: Exercise    Move It!  Clinical staff conducted group or individual video education with verbal and written material and guidebook.  Patient learns the recommended Pritikin exercise program. Exercise with the goal of living a long, healthy life. Some of the health benefits of exercise include controlled diabetes, healthier blood pressure levels, improved cholesterol levels, improved heart and lung capacity, improved sleep, and better body composition. Everyone should speak with their doctor before starting or changing an exercise routine.  Biomechanical Limitations Clinical staff conducted group or individual video education with verbal and written material and guidebook.  Patient learns how biomechanical limitations can impact exercise and how we can mitigate and possibly overcome limitations to have an impactful and balanced exercise routine.  Body Composition Clinical staff conducted group or individual video education with verbal and written material and guidebook.  Patient learns that body composition (ratio of muscle mass to fat mass) is a key component to assessing overall fitness, rather than body weight alone. Increased fat mass, especially visceral belly fat, can put us  at increased risk for metabolic syndrome, type 2 diabetes, heart disease, and even death. It is recommended to combine diet and exercise (cardiovascular and resistance training) to improve your body composition. Seek guidance from your physician and exercise physiologist before implementing an exercise routine.  Exercise Action Plan Clinical staff conducted group or individual video education with verbal and written material and guidebook.  Patient learns the recommended strategies to  achieve and enjoy long-term exercise adherence, including variety, self-motivation, self-efficacy, and positive decision making. Benefits of exercise include fitness, good health, weight management, more energy, better sleep, less stress, and overall well-being.  Medical   Heart Disease Risk Reduction Clinical staff conducted group or individual video education with verbal and written material and guidebook.  Patient learns our heart is our most vital organ as it circulates oxygen, nutrients, white blood cells, and hormones throughout the entire body, and carries waste away. Data supports a plant-based eating plan like the Pritikin Program for its effectiveness in slowing progression of and reversing heart disease. The video provides a number of recommendations to address heart disease.   Metabolic Syndrome and Belly Fat  Clinical staff conducted group or individual video education with verbal and written material and guidebook.  Patient learns what metabolic syndrome is, how it leads to heart disease, and how one can reverse it and keep it from coming back. You have metabolic syndrome if you have 3 of the following 5 criteria: abdominal obesity, high blood pressure, high triglycerides, low HDL cholesterol, and high blood sugar.  Hypertension and Heart Disease Clinical staff conducted group or individual video education with verbal and written material and guidebook.  Patient learns that high blood pressure, or hypertension, is very common in the [place] . Hypertension is largely due to excessive salt intake, but other important risk factors include being overweight, physical inactivity, drinking too much alcohol, smoking, and not eating enough potassium from fruits and vegetables. High blood pressure is a leading risk factor for heart attack, stroke, congestive heart failure, dementia, kidney failure, and premature death. Long-term effects of excessive salt intake include stiffening of the  arteries and thickening of heart muscle and organ damage. Recommendations include ways to reduce hypertension and the risk of heart disease.  Diseases of Our Time - Focusing on Diabetes Clinical staff conducted group or individual video education with verbal and written material and guidebook.  Patient learns why the best way to stop diseases of our time  is prevention, through food and other lifestyle changes. Medicine (such as prescription pills and surgeries) is often only a Band-Aid on the problem, not a long-term solution. Most common diseases of our time include obesity, type 2 diabetes, hypertension, heart disease, and cancer. The Pritikin Program is recommended and has been proven to help reduce, reverse, and/or prevent the damaging effects of metabolic syndrome.  Nutrition   Overview of the Pritikin Eating Plan  Clinical staff conducted group or individual video education with verbal and written material and guidebook.  Patient learns about the Pritikin Eating Plan for disease risk reduction. The Pritikin Eating Plan emphasizes a wide variety of unrefined, minimally-processed carbohydrates, like fruits, vegetables, whole grains, and legumes. Go, Caution, and Stop food choices are explained. Plant-based and lean animal proteins are emphasized. Rationale provided for low sodium intake for blood pressure control, low added sugars for blood sugar stabilization, and low added fats and oils for coronary artery disease risk reduction and weight management.  Calorie Density  Clinical staff conducted group or individual video education with verbal and written material and guidebook.  Patient learns about calorie density and how it impacts the Pritikin Eating Plan. Knowing the characteristics of the food you choose will help you decide whether those foods will lead to weight gain or weight loss, and whether you want to consume more or less of them. Weight loss is usually a side effect of the Pritikin  Eating Plan because of its focus on low calorie-dense foods.  Label Reading  Clinical staff conducted group or individual video education with verbal and written material and guidebook.  Patient learns about the Pritikin recommended label reading guidelines and corresponding recommendations regarding calorie density, added sugars, sodium content, and whole grains.  Dining Out - Part 1  Clinical staff conducted group or individual video education with verbal and written material and guidebook.  Patient learns that restaurant meals can be sabotaging because they can be so high in calories, fat, sodium, and/or sugar. Patient learns recommended strategies on how to positively address this and avoid unhealthy pitfalls.  Facts on Fats  Clinical staff conducted group or individual video education with verbal and written material and guidebook.  Patient learns that lifestyle modifications can be just as effective, if not more so, as many medications for lowering your risk of heart disease. A Pritikin lifestyle can help to reduce your risk of inflammation and atherosclerosis (cholesterol build-up, or plaque, in the artery walls). Lifestyle interventions such as dietary choices and physical activity address the cause of atherosclerosis. A review of the types of fats and their impact on blood cholesterol levels, along with dietary recommendations to reduce fat intake is also included.  Nutrition Action Plan  Clinical staff conducted group or individual video education with verbal and written material and guidebook.  Patient learns how to incorporate Pritikin recommendations into their lifestyle. Recommendations include planning and keeping personal health goals in mind as an important part of their success.  Healthy Mind-Set    Healthy Minds, Bodies, Hearts  Clinical staff conducted group or individual video education with verbal and written material and guidebook.  Patient learns how to identify when they  are stressed. Video will discuss the impact of that stress, as well as the many benefits of stress management. Patient will also be introduced to stress management techniques. The way we think, act, and feel has an impact on our hearts.  How Our Thoughts Can Heal Our Hearts  Clinical staff conducted group or individual video  education with verbal and written material and guidebook.  Patient learns that negative thoughts can cause depression and anxiety. This can result in negative lifestyle behavior and serious health problems. Cognitive behavioral therapy is an effective method to help control our thoughts in order to change and improve our emotional outlook.  Additional Videos:  Exercise    Improving Performance  Clinical staff conducted group or individual video education with verbal and written material and guidebook.  Patient learns to use a non-linear approach by alternating intensity levels and lengths of time spent exercising to help burn more calories and lose more body fat. Cardiovascular exercise helps improve heart health, metabolism, hormonal balance, blood sugar control, and recovery from fatigue. Resistance training improves strength, endurance, balance, coordination, reaction time, metabolism, and muscle mass. Flexibility exercise improves circulation, posture, and balance. Seek guidance from your physician and exercise physiologist before implementing an exercise routine and learn your capabilities and proper form for all exercise.  Introduction to Yoga  Clinical staff conducted group or individual video education with verbal and written material and guidebook.  Patient learns about yoga, a discipline of the coming together of mind, breath, and body. The benefits of yoga include improved flexibility, improved range of motion, better posture and core strength, increased lung function, weight loss, and positive self-image. Yoga's heart health benefits include lowered blood pressure,  healthier heart rate, decreased cholesterol and triglyceride levels, improved immune function, and reduced stress. Seek guidance from your physician and exercise physiologist before implementing an exercise routine and learn your capabilities and proper form for all exercise.  Medical   Aging: Enhancing Your Quality of Life  Clinical staff conducted group or individual video education with verbal and written material and guidebook.  Patient learns key strategies and recommendations to stay in good physical health and enhance quality of life, such as prevention strategies, having an advocate, securing a Health Care Proxy and Power of Attorney, and keeping a list of medications and system for tracking them. It also discusses how to avoid risk for bone loss.  Biology of Weight Control  Clinical staff conducted group or individual video education with verbal and written material and guidebook.  Patient learns that weight gain occurs because we consume more calories than we burn (eating more, moving less). Even if your body weight is normal, you may have higher ratios of fat compared to muscle mass. Too much body fat puts you at increased risk for cardiovascular disease, heart attack, stroke, type 2 diabetes, and obesity-related cancers. In addition to exercise, following the Pritikin Eating Plan can help reduce your risk.  Decoding Lab Results  Clinical staff conducted group or individual video education with verbal and written material and guidebook.  Patient learns that lab test reflects one measurement whose values change over time and are influenced by many factors, including medication, stress, sleep, exercise, food, hydration, pre-existing medical conditions, and more. It is recommended to use the knowledge from this video to become more involved with your lab results and evaluate your numbers to speak with your doctor.   Diseases of Our Time - Overview  Clinical staff conducted group or  individual video education with verbal and written material and guidebook.  Patient learns that according to the CDC, 50% to 70% of chronic diseases (such as obesity, type 2 diabetes, elevated lipids, hypertension, and heart disease) are avoidable through lifestyle improvements including healthier food choices, listening to satiety cues, and increased physical activity.  Sleep Disorders Clinical staff conducted group or individual  video education with verbal and written material and guidebook.  Patient learns how good quality and duration of sleep are important to overall health and well-being. Patient also learns about sleep disorders and how they impact health along with recommendations to address them, including discussing with a physician.  Nutrition  Dining Out - Part 2 Clinical staff conducted group or individual video education with verbal and written material and guidebook.  Patient learns how to plan ahead and communicate in order to maximize their dining experience in a healthy and nutritious manner. Included are recommended food choices based on the type of restaurant the patient is visiting.   Fueling a Banker conducted group or individual video education with verbal and written material and guidebook.  There is a strong connection between our food choices and our health. Diseases like obesity and type 2 diabetes are very prevalent and are in large-part due to lifestyle choices. The Pritikin Eating Plan provides plenty of food and hunger-curbing satisfaction. It is easy to follow, affordable, and helps reduce health risks.  Menu Workshop  Clinical staff conducted group or individual video education with verbal and written material and guidebook.  Patient learns that restaurant meals can sabotage health goals because they are often packed with calories, fat, sodium, and sugar. Recommendations include strategies to plan ahead and to communicate with the manager,  chef, or server to help order a healthier meal.  Planning Your Eating Strategy  Clinical staff conducted group or individual video education with verbal and written material and guidebook.  Patient learns about the Pritikin Eating Plan and its benefit of reducing the risk of disease. The Pritikin Eating Plan does not focus on calories. Instead, it emphasizes high-quality, nutrient-rich foods. By knowing the characteristics of the foods, we choose, we can determine their calorie density and make informed decisions.  Targeting Your Nutrition Priorities  Clinical staff conducted group or individual video education with verbal and written material and guidebook.  Patient learns that lifestyle habits have a tremendous impact on disease risk and progression. This video provides eating and physical activity recommendations based on your personal health goals, such as reducing LDL cholesterol, losing weight, preventing or controlling type 2 diabetes, and reducing high blood pressure.  Vitamins and Minerals  Clinical staff conducted group or individual video education with verbal and written material and guidebook.  Patient learns different ways to obtain key vitamins and minerals, including through a recommended healthy diet. It is important to discuss all supplements you take with your doctor.   Healthy Mind-Set    Smoking Cessation  Clinical staff conducted group or individual video education with verbal and written material and guidebook.  Patient learns that cigarette smoking and tobacco addiction pose a serious health risk which affects millions of people. Stopping smoking will significantly reduce the risk of heart disease, lung disease, and many forms of cancer. Recommended strategies for quitting are covered, including working with your doctor to develop a successful plan.  Culinary   Becoming a Set designer conducted group or individual video education with verbal and written  material and guidebook.  Patient learns that cooking at home can be healthy, cost-effective, quick, and puts them in control. Keys to cooking healthy recipes will include looking at your recipe, assessing your equipment needs, planning ahead, making it simple, choosing cost-effective seasonal ingredients, and limiting the use of added fats, salts, and sugars.  Cooking - Breakfast and Snacks  Clinical staff conducted group or individual  video education with verbal and written material and guidebook.  Patient learns how important breakfast is to satiety and nutrition through the entire day. Recommendations include key foods to eat during breakfast to help stabilize blood sugar levels and to prevent overeating at meals later in the day. Planning ahead is also a key component.  Cooking - Educational psychologist conducted group or individual video education with verbal and written material and guidebook.  Patient learns eating strategies to improve overall health, including an approach to cook more at home. Recommendations include thinking of animal protein as a side on your plate rather than center stage and focusing instead on lower calorie dense options like vegetables, fruits, whole grains, and plant-based proteins, such as beans. Making sauces in large quantities to freeze for later and leaving the skin on your vegetables are also recommended to maximize your experience.  Cooking - Healthy Salads and Dressing Clinical staff conducted group or individual video education with verbal and written material and guidebook.  Patient learns that vegetables, fruits, whole grains, and legumes are the foundations of the Pritikin Eating Plan. Recommendations include how to incorporate each of these in flavorful and healthy salads, and how to create homemade salad dressings. Proper handling of ingredients is also covered. Cooking - Soups and [employer] - Soups and Desserts Clinical staff conducted  group or individual video education with verbal and written material and guidebook.  Patient learns that Pritikin soups and desserts make for easy, nutritious, and delicious snacks and meal components that are low in sodium, fat, sugar, and calorie density, while high in vitamins, minerals, and filling fiber. Recommendations include simple and healthy ideas for soups and desserts.   Overview     The Pritikin Solution Program Overview Clinical staff conducted group or individual video education with verbal and written material and guidebook.  Patient learns that the results of the Pritikin Program have been documented in more than 100 articles published in peer-reviewed journals, and the benefits include reducing risk factors for (and, in some cases, even reversing) high cholesterol, high blood pressure, type 2 diabetes, obesity, and more! An overview of the three key pillars of the Pritikin Program will be covered: eating well, doing regular exercise, and having a healthy mind-set.  WORKSHOPS  Exercise: Exercise Basics: Building Your Action Plan Clinical staff led group instruction and group discussion with PowerPoint presentation and patient guidebook. To enhance the learning environment the use of posters, models and videos may be added. At the conclusion of this workshop, patients will comprehend the difference between physical activity and exercise, as well as the benefits of incorporating both, into their routine. Patients will understand the FITT (Frequency, Intensity, Time, and Type) principle and how to use it to build an exercise action plan. In addition, safety concerns and other considerations for exercise and cardiac rehab will be addressed by the presenter. The purpose of this lesson is to promote a comprehensive and effective weekly exercise routine in order to improve patients' overall level of fitness.   Managing Heart Disease: Your Path to a Healthier Heart Clinical staff led  group instruction and group discussion with PowerPoint presentation and patient guidebook. To enhance the learning environment the use of posters, models and videos may be added.At the conclusion of this workshop, patients will understand the anatomy and physiology of the heart. Additionally, they will understand how Pritikin's three pillars impact the risk factors, the progression, and the management of heart disease.  The purpose of  this lesson is to provide a high-level overview of the heart, heart disease, and how the Pritikin lifestyle positively impacts risk factors.  Exercise Biomechanics Clinical staff led group instruction and group discussion with PowerPoint presentation and patient guidebook. To enhance the learning environment the use of posters, models and videos may be added. Patients will learn how the structural parts of their bodies function and how these functions impact their daily activities, movement, and exercise. Patients will learn how to promote a neutral spine, learn how to manage pain, and identify ways to improve their physical movement in order to promote healthy living. The purpose of this lesson is to expose patients to common physical limitations that impact physical activity. Participants will learn practical ways to adapt and manage aches and pains, and to minimize their effect on regular exercise. Patients will learn how to maintain good posture while sitting, walking, and lifting.  Balance Training and Fall Prevention  Clinical staff led group instruction and group discussion with PowerPoint presentation and patient guidebook. To enhance the learning environment the use of posters, models and videos may be added. At the conclusion of this workshop, patients will understand the importance of their sensorimotor skills (vision, proprioception, and the vestibular system) in maintaining their ability to balance as they age. Patients will apply a variety of balancing  exercises that are appropriate for their current level of function. Patients will understand the common causes for poor balance, possible solutions to these problems, and ways to modify their physical environment in order to minimize their fall risk. The purpose of this lesson is to teach patients about the importance of maintaining balance as they age and ways to minimize their risk of falling.  WORKSHOPS   Nutrition:  Fueling a Ship broker led group instruction and group discussion with PowerPoint presentation and patient guidebook. To enhance the learning environment the use of posters, models and videos may be added. Patients will review the foundational principles of the Pritikin Eating Plan and understand what constitutes a serving size in each of the food groups. Patients will also learn Pritikin-friendly foods that are better choices when away from home and review make-ahead meal and snack options. Calorie density will be reviewed and applied to three nutrition priorities: weight maintenance, weight loss, and weight gain. The purpose of this lesson is to reinforce (in a group setting) the key concepts around what patients are recommended to eat and how to apply these guidelines when away from home by planning and selecting Pritikin-friendly options. Patients will understand how calorie density may be adjusted for different weight management goals.  Mindful Eating  Clinical staff led group instruction and group discussion with PowerPoint presentation and patient guidebook. To enhance the learning environment the use of posters, models and videos may be added. Patients will briefly review the concepts of the Pritikin Eating Plan and the importance of low-calorie dense foods. The concept of mindful eating will be introduced as well as the importance of paying attention to internal hunger signals. Triggers for non-hunger eating and techniques for dealing with triggers will be explored.  The purpose of this lesson is to provide patients with the opportunity to review the basic principles of the Pritikin Eating Plan, discuss the value of eating mindfully and how to measure internal cues of hunger and fullness using the Hunger Scale. Patients will also discuss reasons for non-hunger eating and learn strategies to use for controlling emotional eating.  Targeting Your Nutrition Priorities Clinical staff led group instruction  and group discussion with PowerPoint presentation and patient guidebook. To enhance the learning environment the use of posters, models and videos may be added. Patients will learn how to determine their genetic susceptibility to disease by reviewing their family history. Patients will gain insight into the importance of diet as part of an overall healthy lifestyle in mitigating the impact of genetics and other environmental insults. The purpose of this lesson is to provide patients with the opportunity to assess their personal nutrition priorities by looking at their family history, their own health history and current risk factors. Patients will also be able to discuss ways of prioritizing and modifying the Pritikin Eating Plan for their highest risk areas  Menu  Clinical staff led group instruction and group discussion with PowerPoint presentation and patient guidebook. To enhance the learning environment the use of posters, models and videos may be added. Using menus brought in from [employer], or printed from [employer], patients will apply the Pritikin dining out guidelines that were presented in the [employer] video. Patients will also be able to practice these guidelines in a variety of provided scenarios. The purpose of this lesson is to provide patients with the opportunity to practice hands-on learning of the Pritikin Dining Out guidelines with actual menus and practice scenarios.  Label Reading Clinical staff led group instruction  and group discussion with PowerPoint presentation and patient guidebook. To enhance the learning environment the use of posters, models and videos may be added. Patients will review and discuss the Pritikin label reading guidelines presented in Pritikin's Label Reading Educational series video. Using fool labels brought in from local grocery stores and markets, patients will apply the label reading guidelines and determine if the packaged food meet the Pritikin guidelines. The purpose of this lesson is to provide patients with the opportunity to review, discuss, and practice hands-on learning of the Pritikin Label Reading guidelines with actual packaged food labels. Cooking School  Pritikin's [employer] are designed to teach patients ways to prepare quick, simple, and affordable recipes at home. The importance of nutrition's role in chronic disease risk reduction is reflected in its emphasis in the overall Pritikin program. By learning how to prepare essential core Pritikin Eating Plan recipes, patients will increase control over what they eat; be able to customize the flavor of foods without the use of added salt, sugar, or fat; and improve the quality of the food they consume. By learning a set of core recipes which are easily assembled, quickly prepared, and affordable, patients are more likely to prepare more healthy foods at home. These workshops focus on convenient breakfasts, simple entres, side dishes, and desserts which can be prepared with minimal effort and are consistent with nutrition recommendations for cardiovascular risk reduction. Cooking [employer] are taught by a Armed forces logistics/support/administrative officer (RD) who has been trained by the [employer]. The chef or RD has a clear understanding of the importance of minimizing - if not completely eliminating - added fat, sugar, and sodium in recipes. Throughout the series of Cooking School Workshop sessions, patients will learn  about healthy ingredients and efficient methods of cooking to build confidence in their capability to prepare    Cooking School weekly topics:  Adding Flavor- Sodium-Free  Fast and Healthy Breakfasts  Powerhouse Plant-Based Proteins  Satisfying Salads and Dressings  Simple Sides and Sauces  International Cuisine-Spotlight on the [employer] Zones  Delicious Desserts  Savory Soups  [employer] - Meals in  a Snap  Tasty Appetizers and Snacks  Comforting Weekend Breakfasts  One-Pot Wonders   Fast Evening Meals  Landscape architect Your Pritikin Plate  WORKSHOPS   Healthy Mindset (Psychosocial):  Focused Goals, Sustainable Changes Clinical staff led group instruction and group discussion with PowerPoint presentation and patient guidebook. To enhance the learning environment the use of posters, models and videos may be added. Patients will be able to apply effective goal setting strategies to establish at least one personal goal, and then take consistent, meaningful action toward that goal. They will learn to identify common barriers to achieving personal goals and develop strategies to overcome them. Patients will also gain an understanding of how our mind-set can impact our ability to achieve goals and the importance of cultivating a positive and growth-oriented mind-set. The purpose of this lesson is to provide patients with a deeper understanding of how to set and achieve personal goals, as well as the tools and strategies needed to overcome common obstacles which may arise along the way.  From Head to Heart: The Power of a Healthy Outlook  Clinical staff led group instruction and group discussion with PowerPoint presentation and patient guidebook. To enhance the learning environment the use of posters, models and videos may be added. Patients will be able to recognize and describe the impact of emotions and mood on physical health. They will discover the importance of  self-care and explore self-care practices which may work for them. Patients will also learn how to utilize the 4 C's to cultivate a healthier outlook and better manage stress and challenges. The purpose of this lesson is to demonstrate to patients how a healthy outlook is an essential part of maintaining good health, especially as they continue their cardiac rehab journey.  Healthy Sleep for a Healthy Heart Clinical staff led group instruction and group discussion with PowerPoint presentation and patient guidebook. To enhance the learning environment the use of posters, models and videos may be added. At the conclusion of this workshop, patients will be able to demonstrate knowledge of the importance of sleep to overall health, well-being, and quality of life. They will understand the symptoms of, and treatments for, common sleep disorders. Patients will also be able to identify daytime and nighttime behaviors which impact sleep, and they will be able to apply these tools to help manage sleep-related challenges. The purpose of this lesson is to provide patients with a general overview of sleep and outline the importance of quality sleep. Patients will learn about a few of the most common sleep disorders. Patients will also be introduced to the concept of "sleep hygiene," and discover ways to self-manage certain sleeping problems through simple daily behavior changes. Finally, the workshop will motivate patients by clarifying the links between quality sleep and their goals of heart-healthy living.   Recognizing and Reducing Stress Clinical staff led group instruction and group discussion with PowerPoint presentation and patient guidebook. To enhance the learning environment the use of posters, models and videos may be added. At the conclusion of this workshop, patients will be able to understand the types of stress reactions, differentiate between acute and chronic stress, and recognize the impact that chronic  stress has on their health. They will also be able to apply different coping mechanisms, such as reframing negative self-talk. Patients will have the opportunity to practice a variety of stress management techniques, such as deep abdominal breathing, progressive muscle relaxation, and/or guided imagery.  The purpose of this lesson is to educate  patients on the role of stress in their lives and to provide healthy techniques for coping with it.  Learning Barriers/Preferences:  Learning Barriers/Preferences - 06/02/24 1534       Learning Barriers/Preferences   Learning Barriers Sight    Learning Preferences Audio;Computer/Internet;Group Instruction;Individual Instruction;Skilled Demonstration;Verbal Instruction;Video;Written Material;Pictoral          Education Topics:  Knowledge Questionnaire Score:  Knowledge Questionnaire Score - 06/02/24 1535       Knowledge Questionnaire Score   Pre Score 23/24          Core Components/Risk Factors/Patient Goals at Admission:  Personal Goals and Risk Factors at Admission - 06/02/24 1535       Core Components/Risk Factors/Patient Goals on Admission    Weight Management Yes;Obesity;Weight Loss    Intervention Weight Management: Develop a combined nutrition and exercise program designed to reach desired caloric intake, while maintaining appropriate intake of nutrient and fiber, sodium and fats, and appropriate energy expenditure required for the weight goal.;Weight Management: Provide education and appropriate resources to help participant work on and attain dietary goals.;Weight Management/Obesity: Establish reasonable short term and long term weight goals.;Obesity: Provide education and appropriate resources to help participant work on and attain dietary goals.    Goal Weight: Long Term 168 lb (76.2 kg)   pt goal   Hypertension Yes    Intervention Provide education on lifestyle modifcations including regular physical activity/exercise, weight  management, moderate sodium restriction and increased consumption of fresh fruit, vegetables, and low fat dairy, alcohol moderation, and smoking cessation.;Monitor prescription use compliance.    Expected Outcomes Short Term: Continued assessment and intervention until BP is < 140/89mm HG in hypertensive participants. < 130/45mm HG in hypertensive participants with diabetes, heart failure or chronic kidney disease.;Long Term: Maintenance of blood pressure at goal levels.    Lipids Yes    Intervention Provide education and support for participant on nutrition & aerobic/resistive exercise along with prescribed medications to achieve LDL 70mg , HDL >40mg .    Expected Outcomes Short Term: Participant states understanding of desired cholesterol values and is compliant with medications prescribed. Participant is following exercise prescription and nutrition guidelines.;Long Term: Cholesterol controlled with medications as prescribed, with individualized exercise RX and with personalized nutrition plan. Value goals: LDL < 70mg , HDL > 40 mg.    Stress Yes    Intervention Refer participants experiencing significant psychosocial distress to appropriate mental health specialists for further evaluation and treatment. When possible, include family members and significant others in education/counseling sessions.;Offer individual and/or small group education and counseling on adjustment to heart disease, stress management and health-related lifestyle change. Teach and support self-help strategies.    Expected Outcomes Short Term: Participant demonstrates changes in health-related behavior, relaxation and other stress management skills, ability to obtain effective social support, and compliance with psychotropic medications if prescribed.;Long Term: Emotional wellbeing is indicated by absence of clinically significant psychosocial distress or social isolation.          Core Components/Risk Factors/Patient Goals Review:    Goals and Risk Factor Review     Row Name 06/08/24 1409             Core Components/Risk Factors/Patient Goals Review   Personal Goals Review Weight Management/Obesity;Hypertension;Lipids;Stress       Review Caley started cardiac rehab on 06/08/24. Makailyn did well with exercise. Vital signs were stable.       Expected Outcomes Gae will continue to participate in cardiac rehab for exercise, nutrition and lifestyle modificaitons  Core Components/Risk Factors/Patient Goals at Discharge (Final Review):   Goals and Risk Factor Review - 06/08/24 1409       Core Components/Risk Factors/Patient Goals Review   Personal Goals Review Weight Management/Obesity;Hypertension;Lipids;Stress    Review Loucille started cardiac rehab on 06/08/24. Marrion did well with exercise. Vital signs were stable.    Expected Outcomes Santia will continue to participate in cardiac rehab for exercise, nutrition and lifestyle modificaitons          ITP Comments:  ITP Comments     Row Name 06/02/24 1312 06/08/24 1336         ITP Comments Dr. Wilbert Bihari medical director. Introduction to pritikin education/intensive cardiac rehab. Initial orientation packet reviewed with patient. 30 Day ITP Review. Merilee started cardiac rehab on 06/08/24. Mireya did well with exercise.         Comments: See ITP Comments

## 2024-06-15 ENCOUNTER — Encounter (HOSPITAL_COMMUNITY)
Admission: RE | Admit: 2024-06-15 | Discharge: 2024-06-15 | Disposition: A | Discharge status: Home / Self Care | Source: Ambulatory Visit | Attending: Cardiology | Admitting: Cardiology

## 2024-06-15 DIAGNOSIS — Z955 Presence of coronary angioplasty implant and graft: Secondary | ICD-10-CM

## 2024-06-15 DIAGNOSIS — Z48812 Encounter for surgical aftercare following surgery on the circulatory system: Secondary | ICD-10-CM | POA: Diagnosis not present

## 2024-06-15 DIAGNOSIS — I214 Non-ST elevation (NSTEMI) myocardial infarction: Secondary | ICD-10-CM

## 2024-06-17 ENCOUNTER — Encounter (HOSPITAL_COMMUNITY)
Admission: RE | Admit: 2024-06-17 | Discharge: 2024-06-17 | Disposition: A | Discharge status: Home / Self Care | Source: Ambulatory Visit | Attending: Cardiology | Admitting: Cardiology

## 2024-06-17 DIAGNOSIS — Z48812 Encounter for surgical aftercare following surgery on the circulatory system: Secondary | ICD-10-CM | POA: Diagnosis not present

## 2024-06-17 DIAGNOSIS — Z955 Presence of coronary angioplasty implant and graft: Secondary | ICD-10-CM

## 2024-06-17 DIAGNOSIS — I214 Non-ST elevation (NSTEMI) myocardial infarction: Secondary | ICD-10-CM

## 2024-06-17 NOTE — Progress Notes (Signed)
 QUALITY OF LIFE SCORE REVIEW  Pt completed Quality of Life survey as a participant in Cardiac Rehab and PHQ9 .  Scores 19.0 or below are considered low.  Pt score very low in several areas Overall 20.23, Health and Function 17.17, socioeconomic 24.07, physiological and spiritual 19.92, family 24.40. Patient quality of life slightly altered by physical constraints which limits ability to perform as prior to recent cardiac illness. Sundy she has experienced anxiety regarding her recent MI and stenting. Mialani denies being depressed.  Secily has fibromyalgia and thinks she was slow to recognize anginal symptoms due to this. Garielle says she has had low energy post MI. Malayla became tearful as we talked. Offered emotional support and reassurance. Katalin may benefit from counseling. Atonya said she will think about proceeding with counseling and let us  know if she wants a referral placed.   Will continue to monitor and intervene as necessary.  Hadassah Elpidio Quan RN BSN

## 2024-06-17 NOTE — Progress Notes (Signed)
 Kathy Stephens said that she talked with her husband. Kathy Stephens is not interested in counseling at this time. Kathy Stephens will let know if she changes her mind.Kathy Stephens Quan RN BSN

## 2024-06-20 ENCOUNTER — Encounter (HOSPITAL_COMMUNITY)
Admission: RE | Admit: 2024-06-20 | Discharge: 2024-06-20 | Disposition: A | Discharge status: Home / Self Care | Source: Ambulatory Visit | Attending: Cardiology

## 2024-06-20 DIAGNOSIS — Z955 Presence of coronary angioplasty implant and graft: Secondary | ICD-10-CM

## 2024-06-20 DIAGNOSIS — I214 Non-ST elevation (NSTEMI) myocardial infarction: Secondary | ICD-10-CM

## 2024-06-20 DIAGNOSIS — Z48812 Encounter for surgical aftercare following surgery on the circulatory system: Secondary | ICD-10-CM | POA: Diagnosis not present

## 2024-06-22 ENCOUNTER — Encounter (HOSPITAL_COMMUNITY)
Admission: RE | Admit: 2024-06-22 | Discharge: 2024-06-22 | Disposition: A | Discharge status: Home / Self Care | Source: Ambulatory Visit | Attending: Cardiology | Admitting: Cardiology

## 2024-06-22 DIAGNOSIS — I214 Non-ST elevation (NSTEMI) myocardial infarction: Secondary | ICD-10-CM

## 2024-06-22 DIAGNOSIS — Z955 Presence of coronary angioplasty implant and graft: Secondary | ICD-10-CM

## 2024-06-22 DIAGNOSIS — Z48812 Encounter for surgical aftercare following surgery on the circulatory system: Secondary | ICD-10-CM | POA: Diagnosis not present

## 2024-06-24 ENCOUNTER — Encounter (HOSPITAL_COMMUNITY): Payer: Self-pay | Admitting: Emergency Medicine

## 2024-06-24 ENCOUNTER — Emergency Department (EMERGENCY_DEPARTMENT_HOSPITAL)

## 2024-06-24 ENCOUNTER — Emergency Department (HOSPITAL_COMMUNITY)

## 2024-06-24 ENCOUNTER — Emergency Department (HOSPITAL_COMMUNITY)
Admission: EM | Admit: 2024-06-24 | Discharge: 2024-06-24 | Disposition: A | Discharge status: Home / Self Care | Source: Ambulatory Visit | Attending: Emergency Medicine | Admitting: Emergency Medicine

## 2024-06-24 ENCOUNTER — Encounter (HOSPITAL_COMMUNITY)
Admission: RE | Admit: 2024-06-24 | Discharge: 2024-06-24 | Disposition: A | Discharge status: Home / Self Care | Source: Ambulatory Visit | Attending: Cardiology | Admitting: Cardiology

## 2024-06-24 ENCOUNTER — Other Ambulatory Visit: Payer: Self-pay

## 2024-06-24 DIAGNOSIS — I2510 Atherosclerotic heart disease of native coronary artery without angina pectoris: Secondary | ICD-10-CM

## 2024-06-24 DIAGNOSIS — R079 Chest pain, unspecified: Secondary | ICD-10-CM | POA: Diagnosis present

## 2024-06-24 DIAGNOSIS — E7800 Pure hypercholesterolemia, unspecified: Secondary | ICD-10-CM

## 2024-06-24 DIAGNOSIS — G459 Transient cerebral ischemic attack, unspecified: Secondary | ICD-10-CM

## 2024-06-24 DIAGNOSIS — Z955 Presence of coronary angioplasty implant and graft: Secondary | ICD-10-CM

## 2024-06-24 DIAGNOSIS — M542 Cervicalgia: Secondary | ICD-10-CM

## 2024-06-24 DIAGNOSIS — I10 Essential (primary) hypertension: Secondary | ICD-10-CM

## 2024-06-24 DIAGNOSIS — Z7982 Long term (current) use of aspirin: Secondary | ICD-10-CM | POA: Insufficient documentation

## 2024-06-24 DIAGNOSIS — Z720 Tobacco use: Secondary | ICD-10-CM

## 2024-06-24 DIAGNOSIS — I214 Non-ST elevation (NSTEMI) myocardial infarction: Secondary | ICD-10-CM

## 2024-06-24 HISTORY — DX: Acute myocardial infarction, unspecified: I21.9

## 2024-06-24 LAB — BASIC METABOLIC PANEL WITH GFR
Anion gap: 9 (ref 5–15)
BUN: 18 mg/dL (ref 8–23)
CO2: 25 mmol/L (ref 22–32)
Calcium: 9.5 mg/dL (ref 8.9–10.3)
Chloride: 103 mmol/L (ref 98–111)
Creatinine, Ser: 1.01 mg/dL — ABNORMAL HIGH (ref 0.44–1.00)
GFR, Estimated: >60 mL/min (ref >60)
Glucose, Bld: 100 mg/dL — ABNORMAL HIGH (ref 70–99)
Potassium: 4.7 mmol/L (ref 3.5–5.1)
Sodium: 137 mmol/L (ref 135–145)

## 2024-06-24 LAB — CBC
HCT: 40.2 % (ref 36.0–46.0)
Hemoglobin: 12.8 g/dL (ref 12.0–15.0)
MCH: 29.6 pg (ref 26.0–34.0)
MCHC: 31.8 g/dL (ref 30.0–36.0)
MCV: 93.1 fL (ref 80.0–100.0)
Platelets: 192 K/uL (ref 150–400)
RBC: 4.32 MIL/uL (ref 3.87–5.11)
RDW: 13 % (ref 11.5–15.5)
WBC: 6.1 K/uL (ref 4.0–10.5)
nRBC: 0 % (ref 0.0–0.2)

## 2024-06-24 LAB — TROPONIN I (HIGH SENSITIVITY)
Troponin I (High Sensitivity): 5 ng/L (ref <18)
Troponin I (High Sensitivity): 6 ng/L (ref <18)

## 2024-06-24 MED ORDER — ISOSORBIDE MONONITRATE ER 30 MG PO TB24: 30.0000 mg | ORAL_TABLET | Freq: Every day | ORAL | 0 refills | Status: DC

## 2024-06-24 NOTE — Consult Note (Signed)
 Cardiology Consultation   Patient ID: [name] MRN: [id number]; DOB: Feb 02, 1961  Admit date: 06/24/2024 Date of Consult: 06/24/2024  PCP:  Chandra Toribio POUR, MD   [facility] Providers Cardiologist:  Lonni Cash, MD  Cardiology APP:  Vicci Rollo SAUNDERS, PA-C       Patient Profile: Kathy Stephens is a 63 y.o. female with a hx of CAD, HTN, HLD, tobacco use, prior TIAs, splenic infarct who is being seen 06/24/2024 for the evaluation of chest pain at the request of Dr. Emil.  History of Present Illness:  [name] has had prior ischemic evaluations including nonischemic nuclear stress test 03/2021, coronary calcium  score was zero 04/2021. Echo 04/2022 with preserved LVEF 60-65%, no RWMA, mild LVH, and no significant valvular disease. Heart monitor with no significant arrhythmias 06/2023.   She was admitted 05/01/24 with NSTEMI. LHC with severe single vessel CAD with 99% subtotal occlusion of the proximal-mi LAD successfully treated with PCI/DES using 2.75 x 16 mm. Residual distal LAD disease of 45% treated medically. Small side branch with stable 90% stenosis too small for PTCA. Echo with LVEF 55%, normal RV, and no significant valvular disease. She was discharged on DAPT and referred to cardiac rehab. She was doing well from a cardiac perspective when seen in follow up 05/10/24, but reported chills and hot flashes. TSH WNL.   Today, 06/24/24, she presented to cardiac rehab complaining of left sided neck pain and back pain reminiscent of her symptoms prior to recent NSTEMI, except pain was in the right side of her neck. She reports symptoms worsened with activity such as walking and relieved with rest. She was directed to [facility] for further evaluation.   Upon arrival, she was hypertensive 136-147 systolic. EKG with improved TWI anterior leads. Initial troponin negative, delta pending.  Cardiology was consulted. She confirms she has had dizziness since her PCI, no syncope. No  new exercise routines. She states that the left neck pain was rated as a 5-6/10 and radiated to her jaw. Pain relieved prior to arrival, but re-occurred during my exam at rest. She is concerned about carotid artery stenosis causing the pain.   In addition, last night in bed she had an alert of elevated HR on her smart watch. When reviewed, I see HR in the 124 BPM range, but no strips and I do not know duration of event.    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   TIA (transient ischemic attack) 05/13/2023    Past Surgical History:  Procedure Laterality Date   CESAREAN SECTION     CHOLECYSTECTOMY  03/19/2012   Procedure: LAPAROSCOPIC CHOLECYSTECTOMY;  Surgeon: Lynwood MALVA Pina, MD;  Location: [facility] OR;  Service: General;  Laterality: N/A;   COLONOSCOPY WITH PROPOFOL  N/A 12/16/2022   Procedure: COLONOSCOPY WITH BIOPSY;  Surgeon: Unk Corinn Skiff, MD;  Location: [facility];  Service: Endoscopy;  Laterality: N/A;   CORONARY STENT INTERVENTION N/A 05/02/2024  Procedure: CORONARY STENT INTERVENTION;  Surgeon: Anner Alm ORN, MD;  Location: [facility] INVASIVE CV LAB;  Service: Cardiovascular;  Laterality: N/A;   FOOT SURGERY  1980's   LAPAROSCOPY     with cystectomy   LEFT HEART CATH AND CORONARY ANGIOGRAPHY N/A 05/02/2024   Procedure: LEFT HEART CATH AND CORONARY ANGIOGRAPHY;  Surgeon: Anner Alm ORN, MD;  Location: [facility] INVASIVE CV LAB;  Service:  Cardiovascular;  Laterality: N/A;   LEFT OOPHORECTOMY     tendon relief     TUBAL LIGATION       Home Medications:  Prior to Admission medications   Medication Sig Start Date End Date Taking? Authorizing Provider  acetaminophen  (TYLENOL ) 500 MG tablet Take 500-1,000 mg by mouth every 6 (six) hours as needed for moderate pain (pain score 4-6).    [provider]  aspirin  EC 81 MG tablet Take 1 tablet (81 mg total) by mouth daily. Swallow whole. 05/13/23   Penumalli, Vikram R, MD  diphenhydrAMINE (BENADRYL) 25 mg capsule Take 25 mg by mouth at bedtime.    [provider]  famotidine  (PEPCID ) 20 MG tablet Take 20 mg by mouth daily.    [provider]  lisinopril  (ZESTRIL ) 10 MG tablet Take 2 tablets (20 mg total) by mouth daily. 05/26/24   Chandra Toribio POUR, MD  loperamide (IMODIUM A-D) 2 MG tablet Take 2 mg by mouth daily.    [provider]  nitroGLYCERIN  (NITROSTAT ) 0.4 MG SL tablet Place 1 tablet (0.4 mg total) under the tongue every 5 (five) minutes as needed for chest pain for up to 3 doses. 05/03/24 05/03/25  Hongalgi, Anand D, MD  rosuvastatin  (CRESTOR ) 10 MG tablet Take 1 tablet (10 mg total) by mouth daily. 05/26/24   Chandra Toribio POUR, MD  Study - ARTEMIS - ziltivekimab 15 mg/0.5 mL or placebo SQ injection (PI-Christopher) Inject 0.5 mLs (15 mg total) into the skin every 28 (twenty-eight) days. For Investigational Use Only. Bring boxes and pens back to research visits. 05/31/24   Lonni Slain, MD  ticagrelor  (BRILINTA ) 90 MG TABS tablet Take 1 tablet (90 mg total) by mouth 2 (two) times daily. 05/26/24   Chandra Toribio POUR, MD    Scheduled Meds:  Continuous Infusions:  PRN Meds:   Allergies:    Allergies  Allergen Reactions   Other Other (See Comments)    Nut Meg: Throat Swelling/Burning    Azithromycin Rash and Other (See Comments)    Tingly in face and lips    Social History:   Social History   Socioeconomic History   Marital status:  Married    Spouse name: Not on file   Number of children: 1   Years of education: Not on file   Highest education level: 12th grade  Occupational History   Not on file  Tobacco Use   Smoking status: Every Day    Types: Cigars    Passive exposure: Current   Smokeless tobacco: Never   Tobacco comments:    5 daily  Vaping Use   Vaping status: Never Used  Substance and Sexual Activity   Alcohol use: Yes    Alcohol/week: 0.0 - 1.0 standard drinks of alcohol    Comment: socially   Drug use: No   Sexual activity: Yes    Partners: Male    Birth control/protection: Surgical    Comment: BTL  Other Topics Concern   Not on file  Social History Narrative   Not on file   Social Drivers  of Health   Financial Resource Strain: Patient Declined (04/28/2024)   Overall Financial Resource Strain (CARDIA)    Difficulty of Paying Living Expenses: Patient declined  Food Insecurity: No Food Insecurity (05/01/2024)   Hunger Vital Sign    Worried About Running Out of Food in the Last Year: Never true    Ran Out of Food in the Last Year: Never true  Transportation Needs: No Transportation Needs (05/01/2024)   PRAPARE - Administrator, Civil Service (Medical): No    Lack of Transportation (Non-Medical): No  Physical Activity: Insufficiently Active (04/28/2024)   Exercise Vital Sign    Days of Exercise per Week: 3 days    Minutes of Exercise per Session: 20 min  Stress: No Stress Concern Present (04/28/2024)   [employer] of Occupational Health - Occupational Stress Questionnaire    Feeling of Stress: Not at all  Social Connections: Unknown (04/28/2024)   Social Connection and Isolation Panel    Frequency of Communication with Friends and Family: Patient declined    Frequency of Social Gatherings with Friends and Family: Patient declined    Attends Religious Services: Patient declined    Database administrator or Organizations: Patient declined    Attends Hospital doctor: Not on file    Marital Status: Married  Recent Concern: Social Connections - Moderately Isolated (03/17/2024)   Social Connection and Isolation Panel    Frequency of Communication with Friends and Family: Once a week    Frequency of Social Gatherings with Friends and Family: Once a week    Attends Religious Services: Never    Database administrator or Organizations: No    Attends Engineer, structural: More than 4 times per year    Marital Status: Married  Catering manager Violence: Not At Risk (05/01/2024)   Humiliation, Afraid, Rape, and Kick questionnaire    Fear of Current or Ex-Partner: No    Emotionally Abused: No    Physically Abused: No    Sexually Abused: No    Family History:    Family History  Problem Relation Age of Onset   Breast cancer Mother 72   Hypertension Mother    Cancer Mother    Stroke Mother    Cancer Maternal Grandfather    Heart disease Maternal Grandfather    Heart disease Paternal Grandmother    Colon cancer Paternal Grandfather    Cancer Paternal Grandfather        testicular   Cancer Father        associated with colon polyps   Heart disease Father    Hyperlipidemia Father    Breast cancer Maternal Aunt      ROS:  Please see the history of present illness.   All other ROS reviewed and negative.     Physical Exam/Data: Vitals:   06/24/24 1206 06/24/24 1310  BP: 136/64 (!) 147/71  Pulse: 63 62  Resp: 16 17  Temp: 98.7 F (37.1 C) 98.9 F (37.2 C)  TempSrc: Oral Oral  SpO2: 98% 100%   No intake or output data in the 24 hours ending 06/24/24 1353    06/02/2024    2:28 PM 05/10/2024    2:34 PM 05/09/2024    2:18 PM  Last 3 Weights  Weight (lbs) 173 lb 1 oz 168 lb 6.4 oz 169 lb 4 oz  Weight (kg) 78.5 kg 76.386 kg 76.771 kg     There is no height or weight on file  to calculate BMI.  General:  Well nourished, well developed, in no acute distress HEENT: normal Neck: no JVD Vascular: No carotid bruits; Distal pulses 2+  bilaterally Cardiac:  normal S1, S2; RRR; no murmur  Lungs:  clear to auscultation bilaterally, no wheezing, rhonchi or rales  Abd: soft, nontender, no hepatomegaly  Ext: no edema Musculoskeletal:  No deformities, BUE and BLE strength normal and equal Skin: warm and dry  Neuro:  CNs 2-12 intact, no focal abnormalities noted Psych:  Normal affect   EKG:  The EKG was personally reviewed and demonstrates:  sinus rhythm with HR 68 TWI V1 Telemetry:  Telemetry was personally reviewed and demonstrates:  sinus to sinus bradycardia with HR 50-60s  Relevant CV Studies:  [facility] 05/02/24:   Prox LAD to Mid LAD lesion is 99% stenosed with 90% stenosed side branch in 1st Diag.   A drug-eluting stent was successfully placed using a STENT SYNERGY XD 2.75X16 => deployed to 3.0 mm. Post intervention, there is a 0% residual stenosis.  TIMI-3 flow maintained   Post intervention, the small side branch remained stable with 90% residual stenosis.  (Not big enough for PTCA)   Mid LAD lesion is 40% stenosed. Dist LAD lesion is 45% stenosed.   ----------------------------------   The left ventricular ejection fraction is 50-55% by visual estimate.   There is no aortic valve stenosis.   Diagnostic               Dominance: Right                                                  Intervention                                    POST-CATH-DIAGNOSIS  Severe single-vessel CAD with 99% subtotal occlusion of the proximal to mid LAD treated successfully with a Synergy XD 3.75 mm x 16 mm stent deployed to 3.0 mm. Preserved/normal LVEF with normal LVEDP    RECOMMENDATIONS   In the absence of any other complications or medical issues, we expect the patient to be ready for discharge from an interventional cardiology perspective on 05/02/2024.   Defer timing of discharge to rounding MD. May need time for additional titration of GDMT for CAD.   Recommend uninterrupted dual antiplatelet therapy with Aspirin  81mg  daily and  Ticagrelor  90mg  twice daily for a minimum of 12 months (ACS-Class I recommendation).   After 1 year, would consider switching to Thienopyridine monotherapy that would be interruptible.  (Maintenance dose Brilinta  60 mg twice daily or Plavix  75 mg daily)     Echo 05/02/24:  1. Left ventricular ejection fraction, by estimation, is 55%. The left  ventricle has low normal function. On limited views, there appears to be  mild hypokinesis of the anteroseptal wall. Left ventricular diastolic  parameters were normal.   2. Right ventricular systolic function is normal. The right ventricular  size is normal.   3. The mitral valve is normal in structure. No evidence of mitral valve  regurgitation. No evidence of mitral stenosis.   4. The aortic valve is normal in structure. Aortic valve regurgitation is  not visualized. No aortic stenosis is present.   5. The inferior vena cava is normal in size with greater than  50%  respiratory variability, suggesting right atrial pressure of 3 mmHg.    Laboratory Data: High Sensitivity Troponin:   Recent Labs  Lab 06/24/24 1159  TROPONINIHS 5     Chemistry Recent Labs  Lab 06/24/24 1159  NA 137  K 4.7  CL 103  CO2 25  GLUCOSE 100*  BUN 18  CREATININE 1.01*  CALCIUM  9.5  GFRNONAA >60  ANIONGAP 9    No results for input(s): PROT, ALBUMIN, AST, ALT, ALKPHOS, BILITOT in the last 168 hours. Lipids No results for input(s): CHOL, TRIG, HDL, LABVLDL, LDLCALC, CHOLHDL in the last 168 hours.  Hematology Recent Labs  Lab 06/24/24 1159  WBC 6.1  RBC 4.32  HGB 12.8  HCT 40.2  MCV 93.1  MCH 29.6  MCHC 31.8  RDW 13.0  PLT 192   Thyroid No results for input(s): TSH, FREET4 in the last 168 hours.  BNPNo results for input(s): BNP, PROBNP in the last 168 hours.  DDimer No results for input(s): DDIMER in the last 168 hours.  Radiology/Studies:  DG Chest 2 View Result Date: 06/24/2024 CLINICAL DATA:  Chest pain.  EXAM: CHEST - 2 VIEW COMPARISON:  05/01/2024 FINDINGS: The cardiomediastinal contours are normal. Coronary stent visualized. The lungs are clear. Pulmonary vasculature is normal. No consolidation, pleural effusion, or pneumothorax. No acute osseous abnormalities are seen. IMPRESSION: No active cardiopulmonary disease. Electronically Signed   By: Andrea Gasman M.D.   On: 06/24/2024 13:42     Assessment and Plan:  Chest pain - she presented to cardiac rehab this morning and reported new right sided neck pain and back pain when beginning warm up exercises - EKG with improved TWI in anterior leads compared to prior tracings before and after PCI - hs troponin 5 --> 6 - not reproducible with palpation, occurred both with minimal exertion this morning and while resting during my exam - can titrate anti-anginal medications by adding 30 mg imdur  - could be due to her side branch with 90% stenosis, but neck pain continues at rest with negative CE - resting pain concerning, will plan for repeat LHC, but this can be done as an outpatient   Recent NSTEMI - DES placed to p-m LAD - ASA and brilinta  - compliant - she previously did not tolerate plavix  due to diarrhea and weight loss - will hold off on medication changes until after repeat LHC   Dizziness, headache - has been dizzy since PCI most days - no syncope or pre-syncope, questions if this is related to brilinta , can consider switching to effient - she also reports dizziness with prior TIA - recommend carotid US    Tachycardia - alerted by her smart watch of an episode of tachycardia last night while in bed, asymptomatic - I can see HR alert for HR 124, but no strips - no arrhythmia on telemetry - consider heart monitor, non-live - last heart monitor after TIA 2024 with no evidence of Afib - not on BB due to baseline HR in the 60s   Prior TIAs - she is concerned her neck pain is due to carotid artery stenosis, reassured that this  typically does not cause pain - no significant stenosis on CTA neck 05/2023 - in the setting of TIA - saw [facility] 08/2023 who felt vertebral artery stenosis was narrowing of the vessel rather than plaque - recommend carotid US    Hypertension - continue 20 mg lisinopril  - BP normalized during my exam - will add 30 mg imdur  at night  Hyperlipidemia with LDL Goal < 70 05/02/2024: Cholesterol 220; HDL 58; LDL Cholesterol 139; Triglycerides 114; VLDL 23 LPA 28.8 - started on 10 mg crestor  - previously had myalgias with lipitor, so started low dose crestor    Will plan to discharge home and return on Tues for OP heart cath.    Risk Assessment/Risk Scores:       For questions or updates, please contact [facility] Please consult [email or web address] for contact info under    Signed, Jon Nat Hails, PA  06/24/2024 1:53 PM

## 2024-06-24 NOTE — Progress Notes (Addendum)
 Pt arrived c/o headache and left-sided upper chest pain originating just under her collarbone and radiating up her left-side of her neck for about the last week, still experiencing both at the time of this writing. Stated when she had her previous MI, she had right-sided neck pain and back pain. Denies back pain and chest pain at this time.  C/o being lightheaded.  Pt has subsequently stated that when she had her last MI, they told her she had a normal 12ECG in the ED, drew labs, released her, then called her back as her trops came back high. ECG obtained and appears to have some T wave changes compared to last ECG in V2 lead.  124/60 upon arrival to CR, 160/84 when c/o headache and left neck pain. BP is now 146/74, O2 100% on RA. Onsite provider notified, reviewed pt, and informed pt is okay to resume exercise if she wants to, provided there are no worsening symptoms and if there are to stop immediately, but that if she ultimately wants to rule out MI it would be a good idea to go to the ED and have her labs tested.

## 2024-06-24 NOTE — ED Provider Notes (Signed)
 [place] EMERGENCY DEPARTMENT AT [facility] Provider Note   CSN: [id number] Arrival date & time: 06/24/24  1146     Patient presents with: No chief complaint on file.   Kathy Stephens is a 63 y.o. female.   18 yo F with a chief complaints of left-sided chest discomfort that radiates to the neck into the back.  She says it feels just like when she had a heart attack a few months ago.  She was in cardiac rehab and started warming up and the symptoms got worse.  Seem to get better with rest and then when she gets up to walk again reoccurs.  Has had some shortness of breath with it.  Tells me she has been doing relatively well since her MI.        Prior to Admission medications   Medication Sig Start Date End Date Taking? Authorizing Provider  acetaminophen  (TYLENOL ) 500 MG tablet Take 500-1,000 mg by mouth every 6 (six) hours as needed for moderate pain (pain score 4-6).    [provider]  aspirin  EC 81 MG tablet Take 1 tablet (81 mg total) by mouth daily. Swallow whole. 05/13/23   Penumalli, Vikram R, MD  diphenhydrAMINE (BENADRYL) 25 mg capsule Take 25 mg by mouth at bedtime.    [provider]  famotidine  (PEPCID ) 20 MG tablet Take 20 mg by mouth daily.    [provider]  lisinopril  (ZESTRIL ) 10 MG tablet Take 2 tablets (20 mg total) by mouth daily. 05/26/24   Chandra Toribio POUR, MD  loperamide (IMODIUM A-D) 2 MG tablet Take 2 mg by mouth daily.    [provider]  nitroGLYCERIN  (NITROSTAT ) 0.4 MG SL tablet Place 1 tablet (0.4 mg total) under the tongue every 5 (five) minutes as needed for chest pain for up to 3 doses. 05/03/24 05/03/25  Hongalgi, Anand D, MD  rosuvastatin  (CRESTOR ) 10 MG tablet Take 1 tablet (10 mg total) by mouth daily. 05/26/24   Chandra Toribio POUR, MD  Study - ARTEMIS - ziltivekimab 15 mg/0.5 mL or placebo SQ injection (PI-Christopher) Inject 0.5 mLs (15 mg total) into the skin every 28 (twenty-eight) days. For  Investigational Use Only. Bring boxes and pens back to research visits. 05/31/24   Lonni Slain, MD  ticagrelor  (BRILINTA ) 90 MG TABS tablet Take 1 tablet (90 mg total) by mouth 2 (two) times daily. 05/26/24   Chandra Toribio POUR, MD    Allergies: Other and Azithromycin    Review of Systems  Updated Vital Signs BP (!) 138/58   Pulse 63   Temp 98.9 F (37.2 C) (Oral)   Resp 17   LMP 11/03/2006   SpO2 100%   Physical Exam Vitals and nursing note reviewed.  Constitutional:      General: She is not in acute distress.    Appearance: She is well-developed. She is not diaphoretic.  HENT:     Head: Normocephalic and atraumatic.  Eyes:     Pupils: Pupils are equal, round, and reactive to light.  Cardiovascular:     Rate and Rhythm: Normal rate and regular rhythm.     Heart sounds: No murmur heard.    No friction rub. No gallop.  Pulmonary:     Effort: Pulmonary effort is normal.     Breath sounds: No wheezing or rales.  Abdominal:     General: There is no distension.     Palpations: Abdomen is soft.     Tenderness: There is no  abdominal tenderness.  Musculoskeletal:        General: No tenderness.     Cervical back: Normal range of motion and neck supple.     Comments: Patient points to a pinpoint area along the left chest, tells me it does not hurt to push there.  Skin:    General: Skin is warm and dry.  Neurological:     Mental Status: She is alert and oriented to person, place, and time.  Psychiatric:        Behavior: Behavior normal.     (all labs ordered are listed, but only abnormal results are displayed) Labs Reviewed  BASIC METABOLIC PANEL WITH GFR - Abnormal; Notable for the following components:      Result Value   Glucose, Bld 100 (*)    Creatinine, Ser 1.01 (*)    All other components within normal limits  CBC  TROPONIN I (HIGH SENSITIVITY)  TROPONIN I (HIGH SENSITIVITY)    EKG: EKG Interpretation Date/Time:  Friday June 24 2024 12:05:15  EDT Ventricular Rate:  66 PR Interval:  156 QRS Duration:  72 QT Interval:  364 QTC Calculation: 381 R Axis:   49  Text Interpretation: Normal sinus rhythm Normal ECG No significant change since last tracing Confirmed by Emil Share [phone]) on 06/24/2024 12:58:49 PM  Radiology: DG Chest 2 View Result Date: 06/24/2024 CLINICAL DATA:  Chest pain. EXAM: CHEST - 2 VIEW COMPARISON:  05/01/2024 FINDINGS: The cardiomediastinal contours are normal. Coronary stent visualized. The lungs are clear. Pulmonary vasculature is normal. No consolidation, pleural effusion, or pneumothorax. No acute osseous abnormalities are seen. IMPRESSION: No active cardiopulmonary disease. Electronically Signed   By: Andrea Gasman M.D.   On: 06/24/2024 13:42     Procedures   Medications Ordered in the ED - No data to display                                  Medical Decision Making Amount and/or Complexity of Data Reviewed Labs: ordered. Radiology: ordered.   63 yo F with a chief complaint of left-sided chest pain.  She tells me this feels just like when she had an MI recently and required stenting.  She was at cardiac rehab and it got worse with exertion and improved with rest.  Initial troponins negative.  EKG without obvious ischemic changes.  Will discuss with cardiology.  Chest x-ray independently interpreted by me without focal infiltrate or pneumothorax.  Cards to see at bedside.   Patient care signed out to Dr. Ginger, please see their note for further details of care in the ED.  The patients results and plan were reviewed and discussed.   Any x-rays performed were independently reviewed by myself.   Differential diagnosis were considered with the presenting HPI.  Medications - No data to display  Vitals:   06/24/24 1415 06/24/24 1430 06/24/24 1445 06/24/24 1500  BP: 119/62 134/65 128/68 (!) 138/58  Pulse: (!) 59 63 65 63  Resp: 17 20 19 17   Temp:      TempSrc:      SpO2: 100% 100% 100%  100%    Final diagnoses:  Chest pain with moderate risk for cardiac etiology        Final diagnoses:  Chest pain with moderate risk for cardiac etiology    ED Discharge Orders     None          Emil Share,  DO 06/24/24 1513

## 2024-06-24 NOTE — ED Notes (Signed)
 Transported to vascular.

## 2024-06-24 NOTE — H&P (View-Only) (Signed)
 Cardiology Consultation   Patient ID: Kathy Stephens MRN: [id number]; DOB: Feb 02, 1961  Admit date: 06/24/2024 Date of Consult: 06/24/2024  PCP:  Chandra Toribio POUR, MD   [facility] Providers Cardiologist:  Lonni Cash, MD  Cardiology APP:  Vicci Rollo SAUNDERS, PA-C       Patient Profile: Kathy Stephens is a 63 y.o. female with a hx of CAD, HTN, HLD, tobacco use, prior TIAs, splenic infarct who is being seen 06/24/2024 for the evaluation of chest pain at the request of Dr. Emil.  History of Present Illness:  Kathy Stephens has had prior ischemic evaluations including nonischemic nuclear stress test 03/2021, coronary calcium  score was zero 04/2021. Echo 04/2022 with preserved LVEF 60-65%, no RWMA, mild LVH, and no significant valvular disease. Heart monitor with no significant arrhythmias 06/2023.   She was admitted 05/01/24 with NSTEMI. LHC with severe single vessel CAD with 99% subtotal occlusion of the proximal-mi LAD successfully treated with PCI/DES using 2.75 x 16 mm. Residual distal LAD disease of 45% treated medically. Small side branch with stable 90% stenosis too small for PTCA. Echo with LVEF 55%, normal RV, and no significant valvular disease. She was discharged on DAPT and referred to cardiac rehab. She was doing well from a cardiac perspective when seen in follow up 05/10/24, but reported chills and hot flashes. TSH WNL.   Today, 06/24/24, she presented to cardiac rehab complaining of left sided neck pain and back pain reminiscent of her symptoms prior to recent NSTEMI, except pain was in the right side of her neck. She reports symptoms worsened with activity such as walking and relieved with rest. She was directed to [facility] for further evaluation.   Upon arrival, she was hypertensive 136-147 systolic. EKG with improved TWI anterior leads. Initial troponin negative, delta pending.  Cardiology was consulted. She confirms she has had dizziness since her PCI, no syncope. No  new exercise routines. She states that the left neck pain was rated as a 5-6/10 and radiated to her jaw. Pain relieved prior to arrival, but re-occurred during my exam at rest. She is concerned about carotid artery stenosis causing the pain.   In addition, last night in bed she had an alert of elevated HR on her smart watch. When reviewed, I see HR in the 124 BPM range, but no strips and I do not know duration of event.    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   TIA (transient ischemic attack) 05/13/2023    Past Surgical History:  Procedure Laterality Date   CESAREAN SECTION     CHOLECYSTECTOMY  03/19/2012   Procedure: LAPAROSCOPIC CHOLECYSTECTOMY;  Surgeon: Lynwood MALVA Pina, MD;  Location: [facility] OR;  Service: General;  Laterality: N/A;   COLONOSCOPY WITH PROPOFOL  N/A 12/16/2022   Procedure: COLONOSCOPY WITH BIOPSY;  Surgeon: Unk Corinn Skiff, MD;  Location: [facility];  Service: Endoscopy;  Laterality: N/A;   CORONARY STENT INTERVENTION N/A 05/02/2024  Procedure: CORONARY STENT INTERVENTION;  Surgeon: Anner Alm ORN, MD;  Location: [facility] INVASIVE CV LAB;  Service: Cardiovascular;  Laterality: N/A;   FOOT SURGERY  1980's   LAPAROSCOPY     with cystectomy   LEFT HEART CATH AND CORONARY ANGIOGRAPHY N/A 05/02/2024   Procedure: LEFT HEART CATH AND CORONARY ANGIOGRAPHY;  Surgeon: Anner Alm ORN, MD;  Location: [facility] INVASIVE CV LAB;  Service:  Cardiovascular;  Laterality: N/A;   LEFT OOPHORECTOMY     tendon relief     TUBAL LIGATION       Home Medications:  Prior to Admission medications   Medication Sig Start Date End Date Taking? Authorizing Provider  acetaminophen  (TYLENOL ) 500 MG tablet Take 500-1,000 mg by mouth every 6 (six) hours as needed for moderate pain (pain score 4-6).    [provider]  aspirin  EC 81 MG tablet Take 1 tablet (81 mg total) by mouth daily. Swallow whole. 05/13/23   Penumalli, Vikram R, MD  diphenhydrAMINE (BENADRYL) 25 mg capsule Take 25 mg by mouth at bedtime.    [provider]  famotidine  (PEPCID ) 20 MG tablet Take 20 mg by mouth daily.    [provider]  lisinopril  (ZESTRIL ) 10 MG tablet Take 2 tablets (20 mg total) by mouth daily. 05/26/24   Chandra Toribio POUR, MD  loperamide (IMODIUM A-D) 2 MG tablet Take 2 mg by mouth daily.    [provider]  nitroGLYCERIN  (NITROSTAT ) 0.4 MG SL tablet Place 1 tablet (0.4 mg total) under the tongue every 5 (five) minutes as needed for chest pain for up to 3 doses. 05/03/24 05/03/25  Hongalgi, Anand D, MD  rosuvastatin  (CRESTOR ) 10 MG tablet Take 1 tablet (10 mg total) by mouth daily. 05/26/24   Chandra Toribio POUR, MD  Study - ARTEMIS - ziltivekimab 15 mg/0.5 mL or placebo SQ injection (PI-Christopher) Inject 0.5 mLs (15 mg total) into the skin every 28 (twenty-eight) days. For Investigational Use Only. Bring boxes and pens back to research visits. 05/31/24   Lonni Slain, MD  ticagrelor  (BRILINTA ) 90 MG TABS tablet Take 1 tablet (90 mg total) by mouth 2 (two) times daily. 05/26/24   Chandra Toribio POUR, MD    Scheduled Meds:  Continuous Infusions:  PRN Meds:   Allergies:    Allergies  Allergen Reactions   Other Other (See Comments)    Nut Meg: Throat Swelling/Burning    Azithromycin Rash and Other (See Comments)    Tingly in face and lips    Social History:   Social History   Socioeconomic History   Marital status:  Married    Spouse name: Not on file   Number of children: 1   Years of education: Not on file   Highest education level: 12th grade  Occupational History   Not on file  Tobacco Use   Smoking status: Every Day    Types: Cigars    Passive exposure: Current   Smokeless tobacco: Never   Tobacco comments:    5 daily  Vaping Use   Vaping status: Never Used  Substance and Sexual Activity   Alcohol use: Yes    Alcohol/week: 0.0 - 1.0 standard drinks of alcohol    Comment: socially   Drug use: No   Sexual activity: Yes    Partners: Male    Birth control/protection: Surgical    Comment: BTL  Other Topics Concern   Not on file  Social History Narrative   Not on file   Social Drivers  of Health   Financial Resource Strain: Patient Declined (04/28/2024)   Overall Financial Resource Strain (CARDIA)    Difficulty of Paying Living Expenses: Patient declined  Food Insecurity: No Food Insecurity (05/01/2024)   Hunger Vital Sign    Worried About Running Out of Food in the Last Year: Never true    Ran Out of Food in the Last Year: Never true  Transportation Needs: No Transportation Needs (05/01/2024)   PRAPARE - Administrator, Civil Service (Medical): No    Lack of Transportation (Non-Medical): No  Physical Activity: Insufficiently Active (04/28/2024)   Exercise Vital Sign    Days of Exercise per Week: 3 days    Minutes of Exercise per Session: 20 min  Stress: No Stress Concern Present (04/28/2024)   [employer] of Occupational Health - Occupational Stress Questionnaire    Feeling of Stress: Not at all  Social Connections: Unknown (04/28/2024)   Social Connection and Isolation Panel    Frequency of Communication with Friends and Family: Patient declined    Frequency of Social Gatherings with Friends and Family: Patient declined    Attends Religious Services: Patient declined    Database administrator or Organizations: Patient declined    Attends Hospital doctor: Not on file    Marital Status: Married  Recent Concern: Social Connections - Moderately Isolated (03/17/2024)   Social Connection and Isolation Panel    Frequency of Communication with Friends and Family: Once a week    Frequency of Social Gatherings with Friends and Family: Once a week    Attends Religious Services: Never    Database administrator or Organizations: No    Attends Engineer, structural: More than 4 times per year    Marital Status: Married  Catering manager Violence: Not At Risk (05/01/2024)   Humiliation, Afraid, Rape, and Kick questionnaire    Fear of Current or Ex-Partner: No    Emotionally Abused: No    Physically Abused: No    Sexually Abused: No    Family History:    Family History  Problem Relation Age of Onset   Breast cancer Mother 72   Hypertension Mother    Cancer Mother    Stroke Mother    Cancer Maternal Grandfather    Heart disease Maternal Grandfather    Heart disease Paternal Grandmother    Colon cancer Paternal Grandfather    Cancer Paternal Grandfather        testicular   Cancer Father        associated with colon polyps   Heart disease Father    Hyperlipidemia Father    Breast cancer Maternal Aunt      ROS:  Please see the history of present illness.   All other ROS reviewed and negative.     Physical Exam/Data: Vitals:   06/24/24 1206 06/24/24 1310  BP: 136/64 (!) 147/71  Pulse: 63 62  Resp: 16 17  Temp: 98.7 F (37.1 C) 98.9 F (37.2 C)  TempSrc: Oral Oral  SpO2: 98% 100%   No intake or output data in the 24 hours ending 06/24/24 1353    06/02/2024    2:28 PM 05/10/2024    2:34 PM 05/09/2024    2:18 PM  Last 3 Weights  Weight (lbs) 173 lb 1 oz 168 lb 6.4 oz 169 lb 4 oz  Weight (kg) 78.5 kg 76.386 kg 76.771 kg     There is no height or weight on file  to calculate BMI.  General:  Well nourished, well developed, in no acute distress HEENT: normal Neck: no JVD Vascular: No carotid bruits; Distal pulses 2+  bilaterally Cardiac:  normal S1, S2; RRR; no murmur  Lungs:  clear to auscultation bilaterally, no wheezing, rhonchi or rales  Abd: soft, nontender, no hepatomegaly  Ext: no edema Musculoskeletal:  No deformities, BUE and BLE strength normal and equal Skin: warm and dry  Neuro:  CNs 2-12 intact, no focal abnormalities noted Psych:  Normal affect   EKG:  The EKG was personally reviewed and demonstrates:  sinus rhythm with HR 68 TWI V1 Telemetry:  Telemetry was personally reviewed and demonstrates:  sinus to sinus bradycardia with HR 50-60s  Relevant CV Studies:  [facility] 05/02/24:   Prox LAD to Mid LAD lesion is 99% stenosed with 90% stenosed side branch in 1st Diag.   A drug-eluting stent was successfully placed using a STENT SYNERGY XD 2.75X16 => deployed to 3.0 mm. Post intervention, there is a 0% residual stenosis.  TIMI-3 flow maintained   Post intervention, the small side branch remained stable with 90% residual stenosis.  (Not big enough for PTCA)   Mid LAD lesion is 40% stenosed. Dist LAD lesion is 45% stenosed.   ----------------------------------   The left ventricular ejection fraction is 50-55% by visual estimate.   There is no aortic valve stenosis.   Diagnostic               Dominance: Right                                                  Intervention                                    POST-CATH-DIAGNOSIS  Severe single-vessel CAD with 99% subtotal occlusion of the proximal to mid LAD treated successfully with a Synergy XD 3.75 mm x 16 mm stent deployed to 3.0 mm. Preserved/normal LVEF with normal LVEDP    RECOMMENDATIONS   In the absence of any other complications or medical issues, we expect the patient to be ready for discharge from an interventional cardiology perspective on 05/02/2024.   Defer timing of discharge to rounding MD. May need time for additional titration of GDMT for CAD.   Recommend uninterrupted dual antiplatelet therapy with Aspirin  81mg  daily and  Ticagrelor  90mg  twice daily for a minimum of 12 months (ACS-Class I recommendation).   After 1 year, would consider switching to Thienopyridine monotherapy that would be interruptible.  (Maintenance dose Brilinta  60 mg twice daily or Plavix  75 mg daily)     Echo 05/02/24:  1. Left ventricular ejection fraction, by estimation, is 55%. The left  ventricle has low normal function. On limited views, there appears to be  mild hypokinesis of the anteroseptal wall. Left ventricular diastolic  parameters were normal.   2. Right ventricular systolic function is normal. The right ventricular  size is normal.   3. The mitral valve is normal in structure. No evidence of mitral valve  regurgitation. No evidence of mitral stenosis.   4. The aortic valve is normal in structure. Aortic valve regurgitation is  not visualized. No aortic stenosis is present.   5. The inferior vena cava is normal in size with greater than  50%  respiratory variability, suggesting right atrial pressure of 3 mmHg.    Laboratory Data: High Sensitivity Troponin:   Recent Labs  Lab 06/24/24 1159  TROPONINIHS 5     Chemistry Recent Labs  Lab 06/24/24 1159  NA 137  K 4.7  CL 103  CO2 25  GLUCOSE 100*  BUN 18  CREATININE 1.01*  CALCIUM  9.5  GFRNONAA >60  ANIONGAP 9    No results for input(s): PROT, ALBUMIN, AST, ALT, ALKPHOS, BILITOT in the last 168 hours. Lipids No results for input(s): CHOL, TRIG, HDL, LABVLDL, LDLCALC, CHOLHDL in the last 168 hours.  Hematology Recent Labs  Lab 06/24/24 1159  WBC 6.1  RBC 4.32  HGB 12.8  HCT 40.2  MCV 93.1  MCH 29.6  MCHC 31.8  RDW 13.0  PLT 192   Thyroid No results for input(s): TSH, FREET4 in the last 168 hours.  BNPNo results for input(s): BNP, PROBNP in the last 168 hours.  DDimer No results for input(s): DDIMER in the last 168 hours.  Radiology/Studies:  DG Chest 2 View Result Date: 06/24/2024 CLINICAL DATA:  Chest pain.  EXAM: CHEST - 2 VIEW COMPARISON:  05/01/2024 FINDINGS: The cardiomediastinal contours are normal. Coronary stent visualized. The lungs are clear. Pulmonary vasculature is normal. No consolidation, pleural effusion, or pneumothorax. No acute osseous abnormalities are seen. IMPRESSION: No active cardiopulmonary disease. Electronically Signed   By: Andrea Gasman M.D.   On: 06/24/2024 13:42     Assessment and Plan:  Chest pain - she presented to cardiac rehab this morning and reported new right sided neck pain and back pain when beginning warm up exercises - EKG with improved TWI in anterior leads compared to prior tracings before and after PCI - hs troponin 5 --> 6 - not reproducible with palpation, occurred both with minimal exertion this morning and while resting during my exam - can titrate anti-anginal medications by adding 30 mg imdur  - could be due to her side branch with 90% stenosis, but neck pain continues at rest with negative CE - resting pain concerning, will plan for repeat LHC, but this can be done as an outpatient   Recent NSTEMI - DES placed to p-m LAD - ASA and brilinta  - compliant - she previously did not tolerate plavix  due to diarrhea and weight loss - will hold off on medication changes until after repeat LHC   Dizziness, headache - has been dizzy since PCI most days - no syncope or pre-syncope, questions if this is related to brilinta , can consider switching to effient - she also reports dizziness with prior TIA - recommend carotid US    Tachycardia - alerted by her smart watch of an episode of tachycardia last night while in bed, asymptomatic - I can see HR alert for HR 124, but no strips - no arrhythmia on telemetry - consider heart monitor, non-live - last heart monitor after TIA 2024 with no evidence of Afib - not on BB due to baseline HR in the 60s   Prior TIAs - she is concerned her neck pain is due to carotid artery stenosis, reassured that this  typically does not cause pain - no significant stenosis on CTA neck 05/2023 - in the setting of TIA - saw [facility] 08/2023 who felt vertebral artery stenosis was narrowing of the vessel rather than plaque - recommend carotid US    Hypertension - continue 20 mg lisinopril  - BP normalized during my exam - will add 30 mg imdur  at night  Hyperlipidemia with LDL Goal < 70 05/02/2024: Cholesterol 220; HDL 58; LDL Cholesterol 139; Triglycerides 114; VLDL 23 LPA 28.8 - started on 10 mg crestor  - previously had myalgias with lipitor, so started low dose crestor    Will plan to discharge home and return on Tues for OP heart cath.    Risk Assessment/Risk Scores:       For questions or updates, please contact [facility] Please consult [email or web address] for contact info under    Signed, Jon Nat Hails, PA  06/24/2024 1:53 PM

## 2024-06-24 NOTE — ED Provider Notes (Signed)
 Care assumed from Dr. Emil.  At time of transfer of care, patient is awaiting for recommendations for cardiology given the previous NSTEMI with reportedly very similar discomfort.  Anticipate follow-up on cardiology recommendations.  4:30 PM Cardiology reached out to recommend discharge with plans to come back for cath next week.  They wanted her to get the carotid ultrasound that was ordered now and she can be discharged after.  They will give her Imdur  30 to take at night and not change the Brilinta .  She can then be discharged for outpatient follow-up and they should call to schedule the procedure and management.  Anticipate discharge shortly.   Clinical Impression: 1. Chest pain with moderate risk for cardiac etiology     Disposition: Discharge  Condition: Good  I have discussed the results, Dx and Tx plan with the pt(& family if present). He/she/they expressed understanding and agree(s) with the plan. Discharge instructions discussed at great length. Strict return precautions discussed and pt &/or family have verbalized understanding of the instructions. No further questions at time of discharge.    New Prescriptions   ISOSORBIDE  MONONITRATE (IMDUR ) 30 MG 24 HR TABLET    Take 1 tablet (30 mg total) by mouth at bedtime.    Follow Up: your cardiology team next week        Akiem Urieta, Lonni PARAS, MD 06/24/24 [phone]

## 2024-06-24 NOTE — Discharge Instructions (Signed)
 Your workup today was overall reassuring.  The cardiology team felt you were safe for discharge home but please call them to confirm your procedure next week.  If any symptoms change or worsen acutely, please return to the nearest emergency department.  They want you to start the Imdur  30 mg at bedtime so please pick up the prescription and to do this.

## 2024-06-24 NOTE — ED Triage Notes (Signed)
 Pt came in from Cardiac Rehab class this morning and began having left side CP radiating to left arm.  Also had pain last night. Cardiologist sent for cardiac workup.  Hx of previous MI with cath and stent placement in June.

## 2024-06-25 ENCOUNTER — Other Ambulatory Visit: Payer: Self-pay | Admitting: Physician Assistant

## 2024-06-25 ENCOUNTER — Telehealth: Payer: Self-pay | Admitting: Physician Assistant

## 2024-06-25 NOTE — Progress Notes (Signed)
 OP heart cath scheduled

## 2024-06-27 ENCOUNTER — Encounter (HOSPITAL_COMMUNITY): Admission: RE | Admit: 2024-06-27 | Source: Ambulatory Visit

## 2024-06-27 ENCOUNTER — Telehealth: Payer: Self-pay | Admitting: Cardiology

## 2024-06-27 NOTE — Telephone Encounter (Signed)
 Pt has a heart cath procedure scheduled for tomorrow and would like a c/b giving her more clarity on procedure details. Pt also wants to know if she will be staying overnight.

## 2024-06-27 NOTE — Telephone Encounter (Addendum)
 Cardiac Catheterization scheduled at [facility] for: Tuesday June 28, 2024 1:30 PM Arrival time [facility] Main Entrance A at: 11:30 AM  Diet: -May have light meal until 7:30 AM. (6 hours before procedure time) Approved light meal consists of plain toast, fruit, light soups, crackers.  Hydration: -May drink clear liquids until 2 hours before the procedure, with 16 oz bottle of water  your last intake. Approved liquids: Water , clear tea, black coffee, fruit juices-non-citric and without pulp,Gatorade, plain Jello/popsicles.  Medication instructions: -Usual morning medications can be taken including aspirin  81 mg and Brilinta  90 mg  Plan to go home the same day, you will only stay overnight if medically necessary.  You must have responsible adult to drive you home.  Someone must be with you the first 24 hours after you arrive home.  Reviewed procedure instructions with patient.

## 2024-06-28 ENCOUNTER — Ambulatory Visit (HOSPITAL_COMMUNITY)
Admission: RE | Admit: 2024-06-28 | Discharge: 2024-06-28 | Disposition: A | Discharge status: Home / Self Care | Attending: Cardiology | Admitting: Cardiology

## 2024-06-28 ENCOUNTER — Other Ambulatory Visit: Payer: Self-pay

## 2024-06-28 ENCOUNTER — Encounter (HOSPITAL_COMMUNITY)
Admission: RE | Disposition: A | Discharge status: Home / Self Care | Payer: Self-pay | Source: Home / Self Care | Attending: Cardiology

## 2024-06-28 DIAGNOSIS — Z604 Social exclusion and rejection: Secondary | ICD-10-CM | POA: Insufficient documentation

## 2024-06-28 DIAGNOSIS — Z79899 Other long term (current) drug therapy: Secondary | ICD-10-CM | POA: Diagnosis not present

## 2024-06-28 DIAGNOSIS — I2510 Atherosclerotic heart disease of native coronary artery without angina pectoris: Secondary | ICD-10-CM | POA: Diagnosis not present

## 2024-06-28 DIAGNOSIS — Z955 Presence of coronary angioplasty implant and graft: Secondary | ICD-10-CM | POA: Insufficient documentation

## 2024-06-28 DIAGNOSIS — I252 Old myocardial infarction: Secondary | ICD-10-CM | POA: Insufficient documentation

## 2024-06-28 DIAGNOSIS — I25118 Atherosclerotic heart disease of native coronary artery with other forms of angina pectoris: Secondary | ICD-10-CM | POA: Insufficient documentation

## 2024-06-28 DIAGNOSIS — F17290 Nicotine dependence, other tobacco product, uncomplicated: Secondary | ICD-10-CM | POA: Insufficient documentation

## 2024-06-28 DIAGNOSIS — R000 Tachycardia, unspecified: Secondary | ICD-10-CM | POA: Diagnosis not present

## 2024-06-28 DIAGNOSIS — I10 Essential (primary) hypertension: Secondary | ICD-10-CM | POA: Diagnosis not present

## 2024-06-28 DIAGNOSIS — Z7982 Long term (current) use of aspirin: Secondary | ICD-10-CM | POA: Insufficient documentation

## 2024-06-28 DIAGNOSIS — Z8673 Personal history of transient ischemic attack (TIA), and cerebral infarction without residual deficits: Secondary | ICD-10-CM | POA: Diagnosis not present

## 2024-06-28 HISTORY — PX: LEFT HEART CATH AND CORONARY ANGIOGRAPHY: CATH118249

## 2024-06-28 SURGERY — LEFT HEART CATH AND CORONARY ANGIOGRAPHY
Anesthesia: LOCAL

## 2024-06-28 MED ORDER — LIDOCAINE HCL (PF) 1 % IJ SOLN
INTRAMUSCULAR | Status: DC | PRN
  Administered 2024-06-28: 2 mL

## 2024-06-28 MED ORDER — SODIUM CHLORIDE 0.9% FLUSH: 3.0000 mL | Freq: Two times a day (BID) | INTRAVENOUS | Status: DC

## 2024-06-28 MED ORDER — FENTANYL CITRATE (PF) 100 MCG/2ML IJ SOLN
INTRAMUSCULAR | Status: DC | PRN
  Administered 2024-06-28 (×2): 25 ug via INTRAVENOUS

## 2024-06-28 MED ORDER — SODIUM CHLORIDE 0.9 % IV SOLN: 250.0000 mL | INTRAVENOUS | Status: DC | PRN

## 2024-06-28 MED ORDER — MORPHINE SULFATE (PF) 2 MG/ML IV SOLN
2.0000 mg | Freq: Once | INTRAVENOUS | Status: AC
  Administered 2024-06-28: 2 mg via INTRAVENOUS

## 2024-06-28 MED ORDER — IOHEXOL 350 MG/ML SOLN
INTRAVENOUS | Status: DC | PRN
  Administered 2024-06-28: 28 mL

## 2024-06-28 MED ORDER — FENTANYL CITRATE (PF) 100 MCG/2ML IJ SOLN
INTRAMUSCULAR | Status: AC
Start: 2024-06-28 — End: 2024-06-28
  Filled 2024-06-28: qty 2

## 2024-06-28 MED ORDER — ASPIRIN 81 MG PO CHEW: 81.0000 mg | CHEWABLE_TABLET | ORAL | Status: DC

## 2024-06-28 MED ORDER — MIDAZOLAM HCL 2 MG/2ML IJ SOLN
INTRAMUSCULAR | Status: AC
  Filled 2024-06-28: qty 2

## 2024-06-28 MED ORDER — HEPARIN SODIUM (PORCINE) 1000 UNIT/ML IJ SOLN
INTRAMUSCULAR | Status: AC
  Filled 2024-06-28: qty 10

## 2024-06-28 MED ORDER — VERAPAMIL HCL 2.5 MG/ML IV SOLN
INTRAVENOUS | Status: AC
  Filled 2024-06-28: qty 2

## 2024-06-28 MED ORDER — FREE WATER: 500.0000 mL | Freq: Once | Status: DC

## 2024-06-28 MED ORDER — SODIUM CHLORIDE 0.9% FLUSH: 3.0000 mL | INTRAVENOUS | Status: DC | PRN

## 2024-06-28 MED ORDER — VERAPAMIL HCL 2.5 MG/ML IV SOLN
INTRAVENOUS | Status: DC | PRN
  Administered 2024-06-28: 10 mL via INTRA_ARTERIAL

## 2024-06-28 MED ORDER — LIDOCAINE HCL (PF) 1 % IJ SOLN
INTRAMUSCULAR | Status: AC
  Filled 2024-06-28: qty 30

## 2024-06-28 MED ORDER — MORPHINE SULFATE (PF) 2 MG/ML IV SOLN
INTRAVENOUS | Status: AC
  Administered 2024-06-28: 2 mg via INTRAVENOUS
  Filled 2024-06-28: qty 1

## 2024-06-28 MED ORDER — MIDAZOLAM HCL 2 MG/2ML IJ SOLN
INTRAMUSCULAR | Status: DC | PRN
  Administered 2024-06-28: 1 mg via INTRAVENOUS

## 2024-06-28 MED ORDER — MORPHINE SULFATE (PF) 2 MG/ML IV SOLN: 2.0000 mg | INTRAVENOUS | Status: DC | PRN

## 2024-06-28 MED ORDER — HEPARIN SODIUM (PORCINE) 1000 UNIT/ML IJ SOLN
INTRAMUSCULAR | Status: DC | PRN
Start: 2024-06-28 — End: 2024-06-28
  Administered 2024-06-28: 4000 [IU] via INTRAVENOUS

## 2024-06-28 MED ORDER — MORPHINE SULFATE (PF) 2 MG/ML IV SOLN
2.0000 mg | Freq: Once | INTRAVENOUS | Status: DC
  Filled 2024-06-28: qty 1

## 2024-06-28 SURGICAL SUPPLY — 8 items
CATH INFINITI AMBI 5FR TG (CATHETERS) IMPLANT
DEVICE RAD COMP TR BAND LRG (VASCULAR PRODUCTS) IMPLANT
GLIDESHEATH SLEND SS 6F .021 (SHEATH) IMPLANT
GUIDEWIRE INQWIRE 1.5J.035X260 (WIRE) IMPLANT
KIT SINGLE USE MANIFOLD (KITS) IMPLANT
PACK CARDIAC CATHETERIZATION (CUSTOM PROCEDURE TRAY) ×1 IMPLANT
SET ATX-X65L (MISCELLANEOUS) IMPLANT
SHEATH PROBE COVER 6X72 (BAG) IMPLANT

## 2024-06-28 NOTE — Progress Notes (Addendum)
 Client arrived from cath lab via stretcher; area of firmness noted right ulner site and pressure held x 20 min and firmness noted right forearm and pressure held

## 2024-06-28 NOTE — Discharge Instructions (Signed)

## 2024-06-28 NOTE — Progress Notes (Signed)
 Per Dr Anner ok to discharge home when client's husband arrives; Client's husband here and client states pain has decreased to 3/10 and feels like going home

## 2024-06-28 NOTE — Interval H&P Note (Signed)
 History and Physical Interval Note:  06/28/2024 2:21 PM  Kathy Stephens  has presented today for surgery, with the diagnosis of unstable angina.  The various methods of treatment have been discussed with the patient and family. After consideration of risks, benefits and other options for treatment, the patient has consented to  Procedure(s): LEFT HEART CATH AND CORONARY ANGIOGRAPHY (N/A)  PERCUTANEOUS CORONARY INTERVENTION   as a surgical intervention.  The patient's history has been reviewed, patient examined, no change in status, stable for surgery.  I have reviewed the patient's chart and labs.  Questions were answered to the patient's satisfaction.    Cath Lab Visit (complete for each Cath Lab visit)  Clinical Evaluation Leading to the Procedure:   ACS: Yes.    Non-ACS:    Anginal Classification: CCS IV  Anti-ischemic medical therapy: Maximal Therapy (2 or more classes of medications)  Non-Invasive Test Results: No non-invasive testing performed  Prior CABG: No previous CABG     Alm Clay

## 2024-06-28 NOTE — Progress Notes (Signed)
 Dr Anner placed blood pressure cuff on right forearm and inflated and then deflated over ; and site softer; radial band removed and right forearm tender and Dr Anner aware

## 2024-06-28 NOTE — Progress Notes (Signed)
 Dr Anner in and held pressure right forearm and site softer ; ice pack to site

## 2024-06-28 NOTE — Progress Notes (Addendum)
 Dr Anner in and held pressure  at right forearm firmness and forearm softer after pressure held

## 2024-06-29 ENCOUNTER — Encounter (HOSPITAL_COMMUNITY)

## 2024-06-29 ENCOUNTER — Encounter (HOSPITAL_COMMUNITY): Payer: Self-pay | Admitting: Cardiology

## 2024-07-01 ENCOUNTER — Encounter (HOSPITAL_COMMUNITY)

## 2024-07-01 DIAGNOSIS — Z006 Encounter for examination for normal comparison and control in clinical research program: Secondary | ICD-10-CM

## 2024-07-01 NOTE — Research (Addendum)
  [facility] Phone visit P4 (Month 2 +/-3 days)    Were all eligibility criteria met to continue in study? [x] Yes [] No   Concomitant meds: [x] Yes [] No    Any hospitalizations/Adverse Events/Infections identified: [x] Yes [] No Pt had post cath hematoma that is resolving but still has some pain. Denies edema, redness/tenderness, warmth and drainage. Denies bruising, chest pain. Pt has some SOB that is associated with use of Brilinta . No signs and symptoms of infection.    Pregnancy Test Was the sample collected? [] Yes [] No [x] N/A Was the collection date the same as the visit date? [] Yes [] No Specimen Type: [] Serum [] Urine Collection Date: Collection Time: Pregnancy Test Result: [] Positive [] Negative [] Borderline [] Invalid   Assess dosing and administration conditions: Yes [x]  No[]  Patient successfully self administered study drug on 07/01/24 in LL abdomen at 0929 without complication. Subject verbalized understanding to bring back pen with box to next appt. Instructed patient to dose on same day every month to avoid doses less than 28 days apart. DFUs used for reference.    Ensure updated contact person list: Yes [x]  No[]     Current Outpatient Medications:    acetaminophen  (TYLENOL ) 500 MG tablet, Take 500-1,000 mg by mouth every 6 (six) hours as needed for moderate pain (pain score 4-6)., Disp: , Rfl:    aspirin  EC 81 MG tablet, Take 1 tablet (81 mg total) by mouth daily. Swallow whole., Disp: 30 tablet, Rfl: 12   diphenhydrAMINE (BENADRYL) 25 mg capsule, Take 25 mg by mouth at bedtime., Disp: , Rfl:    famotidine  (PEPCID ) 20 MG tablet, Take 20 mg by mouth daily., Disp: , Rfl:    isosorbide  mononitrate (IMDUR ) 30 MG 24 hr tablet, Take 1 tablet (30 mg total) by mouth at bedtime., Disp: 30 tablet, Rfl: 0   lisinopril  (ZESTRIL ) 10 MG tablet, Take 2 tablets (20 mg total) by mouth daily., Disp: 180 tablet, Rfl: 3   nitroGLYCERIN  (NITROSTAT ) 0.4 MG SL tablet, Place 1 tablet (0.4 mg total)  under the tongue every 5 (five) minutes as needed for chest pain for up to 3 doses., Disp: 100 tablet, Rfl: 3   rosuvastatin  (CRESTOR ) 10 MG tablet, Take 1 tablet (10 mg total) by mouth daily., Disp: 90 tablet, Rfl: 3   Study - [facility] - ziltivekimab 15 mg/0.5 mL or placebo SQ injection (PI-Christopher), Inject 0.5 mLs (15 mg total) into the skin every 28 (twenty-eight) days. For Investigational Use Only. Bring boxes and pens back to research visits., Disp: 0.5 mL, Rfl: 0   ticagrelor  (BRILINTA ) 90 MG TABS tablet, Take 1 tablet (90 mg total) by mouth 2 (two) times daily., Disp: 180 tablet, Rfl: 3

## 2024-07-05 ENCOUNTER — Telehealth (HOSPITAL_COMMUNITY): Payer: Self-pay | Admitting: *Deleted

## 2024-07-05 NOTE — Telephone Encounter (Signed)
-----   Message from Alm Clay sent at 07/02/2024  1:45 PM EDT ----- Regarding: RE: Return to cardiac rehab Yes she should be fine to return. ----- Message ----- From: Cyrus Hadassah ORN, RN Sent: 06/29/2024   8:50 AM EDT To: Alm ORN Clay, MD Subject: Return to cardiac rehab                        Good morning Dr Clay.  Checking to see if you are okay with [name] returning to cardiac rehab post cath.  I appreciate your input!  Sincerely, [facility]  Cardiac Rehab

## 2024-07-06 ENCOUNTER — Encounter (HOSPITAL_COMMUNITY)
Admission: RE | Admit: 2024-07-06 | Discharge: 2024-07-06 | Disposition: A | Discharge status: Home / Self Care | Source: Ambulatory Visit | Attending: Cardiology | Admitting: Cardiology

## 2024-07-06 DIAGNOSIS — Z48812 Encounter for surgical aftercare following surgery on the circulatory system: Secondary | ICD-10-CM | POA: Insufficient documentation

## 2024-07-06 DIAGNOSIS — I214 Non-ST elevation (NSTEMI) myocardial infarction: Secondary | ICD-10-CM

## 2024-07-06 DIAGNOSIS — M542 Cervicalgia: Secondary | ICD-10-CM

## 2024-07-06 DIAGNOSIS — I252 Old myocardial infarction: Secondary | ICD-10-CM | POA: Insufficient documentation

## 2024-07-06 DIAGNOSIS — Z955 Presence of coronary angioplasty implant and graft: Secondary | ICD-10-CM | POA: Diagnosis present

## 2024-07-06 NOTE — Progress Notes (Addendum)
 Kathy Stephens returned to cardiac rehab today per Dr Anner. Right cath site with large resolving bruise present. Area soft. Upon palpation area soft. Right radial pulse 1+. Left radial pulse 2+. Kathy Stephens reported a small amount of discomfort in the center of her wrist. Patient is exercising but not using her right arm. Will consult with onsite provider Josefa Beauvais NP. Onsite provider came into the gym and assessed Kathy Stephens. Josefa did not note any acute findings and told the patient to continue her ADLs and to continue to monitor. Kathy Stephens did not have any further complaints during exercise today.Kathy Elpidio Quan RN BSN

## 2024-07-08 ENCOUNTER — Encounter (HOSPITAL_COMMUNITY)

## 2024-07-11 ENCOUNTER — Encounter (HOSPITAL_COMMUNITY)

## 2024-07-12 NOTE — Progress Notes (Signed)
 Cardiac Individual Treatment Plan  Patient Details  Name: Kathy Stephens MRN: [id number] Date of Birth: 09/01/1961 Referring Provider:   Flowsheet Row INTENSIVE CARDIAC REHAB ORIENT from 06/02/2024 in [facility]  Referring Provider Newman Lawrence, MD    Initial Encounter Date:  Flowsheet Row INTENSIVE CARDIAC REHAB ORIENT from 06/02/2024 in [facility]  Date 06/02/24    Visit Diagnosis: 05/02/24 NSTEMI (non-ST elevated myocardial infarction) (HCC)  05/02/24 DES LAD  Neck pain  Patient's Home Medications on Admission:  Current Outpatient Medications:    acetaminophen  (TYLENOL ) 500 MG tablet, Take 500-1,000 mg by mouth every 6 (six) hours as needed for moderate pain (pain score 4-6)., Disp: , Rfl:    aspirin  EC 81 MG tablet, Take 1 tablet (81 mg total) by mouth daily. Swallow whole., Disp: 30 tablet, Rfl: 12   diphenhydrAMINE (BENADRYL) 25 mg capsule, Take 25 mg by mouth at bedtime., Disp: , Rfl:    famotidine  (PEPCID ) 20 MG tablet, Take 20 mg by mouth daily., Disp: , Rfl:    isosorbide  mononitrate (IMDUR ) 30 MG 24 hr tablet, Take 1 tablet (30 mg total) by mouth at bedtime., Disp: 30 tablet, Rfl: 0   lisinopril  (ZESTRIL ) 10 MG tablet, Take 2 tablets (20 mg total) by mouth daily., Disp: 180 tablet, Rfl: 3   nitroGLYCERIN  (NITROSTAT ) 0.4 MG SL tablet, Place 1 tablet (0.4 mg total) under the tongue every 5 (five) minutes as needed for chest pain for up to 3 doses., Disp: 100 tablet, Rfl: 3   rosuvastatin  (CRESTOR ) 10 MG tablet, Take 1 tablet (10 mg total) by mouth daily., Disp: 90 tablet, Rfl: 3   Study - ARTEMIS - ziltivekimab 15 mg/0.5 mL or placebo SQ injection (PI-Christopher), Inject 0.5 mLs (15 mg total) into the skin every 28 (twenty-eight) days. For Investigational Use Only. Bring boxes and pens back to research visits., Disp: 0.5 mL, Rfl: 0   ticagrelor  (BRILINTA )  90 MG TABS tablet, Take 1 tablet (90 mg total) by mouth 2 (two) times daily., Disp: 180 tablet, Rfl: 3  Past Medical History: Past Medical History:  Diagnosis Date   Allergy    Anemia 06/02/2013   Anxiety 11/05/2011   Avascular necrosis of femur, left (HCC) 03/19/2022   Cancer (HCC) 06/02/2013   skin (nose & face)   Depression 11/05/2011   Fibromyalgia 04/09/2021   GERD (gastroesophageal reflux disease) 11/05/2011   Hyperlipidemia 02/03/2013   Hypertension 04/09/2021   Inflammatory bowel disease (ulcerative colitis) (HCC) 10/08/2017   MI (myocardial infarction) (HCC) 05/02/2024   Migraines 06/02/2013   while on Liada (mesalazine), not on it at this time   Osteopenia 06/02/2013   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   TIA (transient ischemic attack) 05/13/2023   Vertebral artery stenosis 05/12/2023    Tobacco Use: Social History   Tobacco Use  Smoking Status Every Day   Types: Cigars   Passive exposure: Current  Smokeless Tobacco Never  Tobacco Comments   5 daily    Labs: Review Flowsheet  More data exists      Latest Ref Rng & Units 11/27/2017 03/29/2021 05/12/2023 07/02/2023 05/02/2024  Labs for ITP Cardiac and Pulmonary Rehab  Cholestrol 0 - 200 mg/dL 755  740  - 827  779   LDL (calc) 0 - 99 mg/dL 840  830  - 91  860   HDL-C >40 mg/dL 55  56  - 55  58  Trlycerides <150 mg/dL 849  814  - 849  885   Hemoglobin A1c 4.8 - 5.6 % - - - 5.6  -  TCO2 22 - 32 mmol/L - - 26  - -    Capillary Blood Glucose: No results found for: GLUCAP   Exercise Target Goals: Exercise Program Goal: Individual exercise prescription set using results from initial 6 min walk test and THRR while considering  patient's activity barriers and safety.   Exercise Prescription Goal: Initial exercise prescription builds to 30-45 minutes a day of aerobic activity, 2-3 days per week.  Home exercise guidelines will be given to patient during program as part of exercise prescription that the participant will  acknowledge.  Activity Barriers & Risk Stratification:  Activity Barriers & Cardiac Risk Stratification - 06/02/24 1429       Activity Barriers & Cardiac Risk Stratification   Activity Barriers Arthritis;Balance Concerns;Shortness of Breath;Deconditioning;Assistive Device    Cardiac Risk Stratification High   <5 METs on         6 Minute Walk:  6 Minute Walk     Row Name 06/02/24 1531         6 Minute Walk   Phase Initial     Distance 1245 feet     Walk Time 6 minutes     # of Rest Breaks 0     MPH 2.36     METS 3.14     RPE 12     Perceived Dyspnea  1     VO2 Peak 10.98     Symptoms Yes (comment)     Comments 6/10 L chronic calf pain. SOB. Both resolved with rest     Resting HR 61 bpm     Resting BP 116/72     Resting Oxygen Saturation  98 %     Exercise Oxygen Saturation  during 6 min walk 99 %     Max Ex. HR 99 bpm     Max Ex. BP 152/68     2 Minute Post BP 124/64        Oxygen Initial Assessment:   Oxygen Re-Evaluation:   Oxygen Discharge (Final Oxygen Re-Evaluation):   Initial Exercise Prescription:  Initial Exercise Prescription - 06/02/24 1500       Date of Initial Exercise RX and Referring Provider   Date 06/02/24    Referring Provider Newman Lawrence, MD    Expected Discharge Date 08/24/24      NuStep   Level 1    SPM 70    Minutes 15    METs 2      Prescription Details   Frequency (times per week) 3    Duration Progress to 30 minutes of continuous aerobic without signs/symptoms of physical distress      Intensity   THRR 40-80% of Max Heartrate 58-115    Ratings of Perceived Exertion 11-13    Perceived Dyspnea 0-4      Progression   Progression Continue progressive overload as per policy without signs/symptoms or physical distress.      Resistance Training   Training Prescription Yes    Weight 2    Reps 10-15          Perform Capillary Blood Glucose checks as needed.  Exercise Prescription Changes:   Exercise  Prescription Changes     Row Name 06/08/24 1021 06/20/24 1034 07/06/24 1027         Response to Exercise   Blood Pressure (Admit) 114/66 106/72  114/62     Blood Pressure (Exercise) 150/62 148/80 132/66     Blood Pressure (Exit) 100/58 106/72 108/62     Heart Rate (Admit) 67 bpm 65 bpm 69 bpm     Heart Rate (Exercise) 92 bpm 107 bpm 95 bpm     Heart Rate (Exit) 62 bpm 70 bpm 61 bpm     Rating of Perceived Exertion (Exercise) 12 12 9      Symptoms None None None     Comments Off to a good start with exercise. -- --     Duration Progress to 30 minutes of  aerobic without signs/symptoms of physical distress Progress to 30 minutes of  aerobic without signs/symptoms of physical distress Progress to 30 minutes of  aerobic without signs/symptoms of physical distress     Intensity THRR unchanged THRR unchanged THRR unchanged       Progression   Progression Continue to progress workloads to maintain intensity without signs/symptoms of physical distress. Continue to progress workloads to maintain intensity without signs/symptoms of physical distress. Continue to progress workloads to maintain intensity without signs/symptoms of physical distress.     Average METs 2.6 2.7 2.8       Resistance Training   Training Prescription No Yes No     Weight Relaxation day, no weights. 2 lb wts Relaxation day, no weights.     Reps -- 10-15 --     Time -- 5 Minutes --       Interval Training   Interval Training No No No       NuStep   Level 1 1 1      SPM 91 110 108     Minutes 27 25 25      METs 2.6 2.7 2.8        Exercise Comments:   Exercise Comments     Row Name 06/08/24 1121 07/06/24 1134         Exercise Comments Donnette tolerated low intensity exercise well without symptoms. Oriented her to the exercise equipment and stretching routine. Latonda returned to exercise after recent hospitalization and tolerated low intensity exercise without symptoms.         Exercise Goals and Review:    Exercise Goals     Row Name 06/02/24 1429             Exercise Goals   Increase Physical Activity Yes       Intervention Provide advice, education, support and counseling about physical activity/exercise needs.;Develop an individualized exercise prescription for aerobic and resistive training based on initial evaluation findings, risk stratification, comorbidities and participant's personal goals.       Expected Outcomes Short Term: Attend rehab on a regular basis to increase amount of physical activity.;Long Term: Exercising regularly at least 3-5 days a week.;Long Term: Add in home exercise to make exercise part of routine and to increase amount of physical activity.       Increase Strength and Stamina Yes       Intervention Provide advice, education, support and counseling about physical activity/exercise needs.;Develop an individualized exercise prescription for aerobic and resistive training based on initial evaluation findings, risk stratification, comorbidities and participant's personal goals.       Expected Outcomes Short Term: Increase workloads from initial exercise prescription for resistance, speed, and METs.;Short Term: Perform resistance training exercises routinely during rehab and add in resistance training at home;Long Term: Improve cardiorespiratory fitness, muscular endurance and strength as measured by increased METs and functional capacity ( )  Able to understand and use rate of perceived exertion (RPE) scale Yes       Intervention Provide education and explanation on how to use RPE scale       Expected Outcomes Short Term: Able to use RPE daily in rehab to express subjective intensity level;Long Term:  Able to use RPE to guide intensity level when exercising independently       Knowledge and understanding of Target Heart Rate Range (THRR) Yes       Intervention Provide education and explanation of THRR including how the numbers were predicted and where they are  located for reference       Expected Outcomes Short Term: Able to state/look up THRR;Long Term: Able to use THRR to govern intensity when exercising independently;Short Term: Able to use daily as guideline for intensity in rehab       Understanding of Exercise Prescription Yes       Intervention Provide education, explanation, and written materials on patient's individual exercise prescription       Expected Outcomes Short Term: Able to explain program exercise prescription;Long Term: Able to explain home exercise prescription to exercise independently          Exercise Goals Re-Evaluation :  Exercise Goals Re-Evaluation     Row Name 06/08/24 1021 07/06/24 1134           Exercise Goal Re-Evaluation   Exercise Goals Review Increase Physical Activity;Increase Strength and Stamina;Able to understand and use rate of perceived exertion (RPE) scale Increase Physical Activity;Increase Strength and Stamina;Able to understand and use rate of perceived exertion (RPE) scale      Comments Lynnet was able to understand and use RPE scale appropriately. She is awaiting hip surgery. She tolerated the recumbent stepper for 27 minutes without issue. Xenia returned to exercise today post-hospitialization and tolerated low intensity exercise well. Rosario will be out of town the next 2 weeks.      Expected Outcomes Progress workloads as tolerated. Will review goals upon return to cardiac rehab.         Discharge Exercise Prescription (Final Exercise Prescription Changes):  Exercise Prescription Changes - 07/06/24 1027       Response to Exercise   Blood Pressure (Admit) 114/62    Blood Pressure (Exercise) 132/66    Blood Pressure (Exit) 108/62    Heart Rate (Admit) 69 bpm    Heart Rate (Exercise) 95 bpm    Heart Rate (Exit) 61 bpm    Rating of Perceived Exertion (Exercise) 9    Symptoms None    Duration Progress to 30 minutes of  aerobic without signs/symptoms of physical distress    Intensity THRR  unchanged      Progression   Progression Continue to progress workloads to maintain intensity without signs/symptoms of physical distress.    Average METs 2.8      Resistance Training   Training Prescription No    Weight Relaxation day, no weights.      Interval Training   Interval Training No      NuStep   Level 1    SPM 108    Minutes 25    METs 2.8          Nutrition:  Target Goals: Understanding of nutrition guidelines, daily intake of sodium 1500mg , cholesterol 200mg , calories 30% from fat and 7% or less from saturated fats, daily to have 5 or more servings of fruits and vegetables.  Biometrics:  Pre Biometrics - 06/02/24 1428  Pre Biometrics   Waist Circumference 39.5 inches    Hip Circumference 47 inches    Waist to Hip Ratio 0.84 %    Triceps Skinfold 32 mm    % Body Fat 43.3 %    Grip Strength 10 kg    Flexibility --   not done- hip issues   Single Leg Stand 16.56 seconds           Nutrition Therapy Plan and Nutrition Goals:  Nutrition Therapy & Goals - 06/08/24 1112       Nutrition Therapy   Diet Heart Healthy Diet    Drug/Food Interactions Statins/Certain Fruits      Personal Nutrition Goals   Nutrition Goal Patient to identify strategies for reducing cardiovascular risk by attending the [employer] education and nutrition series weekly.    Personal Goal #2 Patient to improve diet quality by using the plate method as a guide for meal planning to include lean protein/plant protein, fruits, vegetables, whole grains, nonfat dairy as part of a well-balanced diet.    Comments Patient has medical history of HTN, HLD, tobacco use, CAD, history of multiple TIAs, splenic infarct, NSTEMI. LDL is not at goal; she continues crestor . Patient will benefit from participation in intensive cardiac rehab for nutrition education, exercise, and lifestyle modification.      Intervention Plan   Intervention Prescribe, educate and counsel regarding individualized  specific dietary modifications aiming towards targeted core components such as weight, hypertension, lipid management, diabetes, heart failure and other comorbidities.;Nutrition handout(s) given to patient.    Expected Outcomes Short Term Goal: Understand basic principles of dietary content, such as calories, fat, sodium, cholesterol and nutrients.;Long Term Goal: Adherence to prescribed nutrition plan.          Nutrition Assessments:  Nutrition Assessments - 06/10/24 1339       Rate Your Plate Scores   Pre Score 49         MEDIFICTS Score Key: >=70 Need to make dietary changes  40-70 Heart Healthy Diet <= 40 Therapeutic Level Cholesterol Diet   Flowsheet Row INTENSIVE CARDIAC REHAB from 06/10/2024 in [facility], [facility]  Picture Your Plate Total Score on Admission 49   Picture Your Plate Scores: <59 Unhealthy dietary pattern with much room for improvement. 41-50 Dietary pattern unlikely to meet recommendations for good health and room for improvement. 51-60 More healthful dietary pattern, with some room for improvement.  >60 Healthy dietary pattern, although there may be some specific behaviors that could be improved.    Nutrition Goals Re-Evaluation:  Nutrition Goals Re-Evaluation     Row Name 06/08/24 1112             Goals   Current Weight 173 lb 1 oz (78.5 kg)       Comment choleserol 220, LDL 139, HDL 58       Expected Outcome Patient has medical history of HTN, HLD, tobacco use, CAD, history of multiple TIAs, splenic infarct, NSTEMI. LDL is not at goal; she continues crestor . Patient will benefit from participation in intensive cardiac rehab for nutrition education, exercise, and lifestyle modification.          Nutrition Goals Re-Evaluation:  Nutrition Goals Re-Evaluation     Row Name 06/08/24 1112             Goals   Current Weight 173 lb 1 oz (78.5 kg)       Comment choleserol 220, LDL 139, HDL 58  Expected Outcome Patient has medical history of HTN, HLD, tobacco use, CAD, history of multiple TIAs, splenic infarct, NSTEMI. LDL is not at goal; she continues crestor . Patient will benefit from participation in intensive cardiac rehab for nutrition education, exercise, and lifestyle modification.          Nutrition Goals Discharge (Final Nutrition Goals Re-Evaluation):  Nutrition Goals Re-Evaluation - 06/08/24 1112       Goals   Current Weight 173 lb 1 oz (78.5 kg)    Comment choleserol 220, LDL 139, HDL 58    Expected Outcome Patient has medical history of HTN, HLD, tobacco use, CAD, history of multiple TIAs, splenic infarct, NSTEMI. LDL is not at goal; she continues crestor . Patient will benefit from participation in intensive cardiac rehab for nutrition education, exercise, and lifestyle modification.          Psychosocial: Target Goals: Acknowledge presence or absence of significant depression and/or stress, maximize coping skills, provide positive support system. Participant is able to verbalize types and ability to use techniques and skills needed for reducing stress and depression.  Initial Review & Psychosocial Screening:  Initial Psych Review & Screening - 06/02/24 1430       Initial Review   Current issues with Current Anxiety/Panic      Family Dynamics   Good Support System? Yes   husband/friends   Comments Fany shared that she has had some anxiety since her MI. She shared that she has fear that something else will happen and that on a recent flight to see family she experienced 2 panic attacks. She denies any depression with this. Support offered, Iolani shared that she does not need any additional support or resources at this time. She has friends and family who are helping her.      Barriers   Psychosocial barriers to participate in program The patient should benefit from training in stress management and relaxation.;Psychosocial barriers identified (see note)       Screening Interventions   Interventions Provide feedback about the scores to participant;To provide support and resources with identified psychosocial needs;Encouraged to exercise    Expected Outcomes Long Term goal: The participant improves quality of Life and PHQ9 Scores as seen by post scores and/or verbalization of changes;Short Term goal: Identification and review with participant of any Quality of Life or Depression concerns found by scoring the questionnaire.;Long Term Goal: Stressors or current issues are controlled or eliminated.          Quality of Life Scores:  Quality of Life - 06/02/24 1534       Quality of Life   Select Quality of Life      Quality of Life Scores   Health/Function Pre 17.17 %    Socioeconomic Pre 24.07 %    Psych/Spiritual Pre 19.92 %    Family Pre 24.4 %    GLOBAL Pre 20.23 %         Scores of 19 and below usually indicate a poorer quality of life in these areas.  A difference of  2-3 points is a clinically meaningful difference.  A difference of 2-3 points in the total score of the Quality of Life Index has been associated with significant improvement in overall quality of life, self-image, physical symptoms, and general health in studies assessing change in quality of life.  PHQ-9: Review Flowsheet  More data exists      06/02/2024 05/09/2024 03/21/2024 10/13/2023 09/08/2023  Depression screen PHQ 2/9  Decreased Interest 0 0 0 0  0  Down, Depressed, Hopeless 0 0 0 0 0  PHQ - 2 Score 0 0 0 0 0  Altered sleeping 2 1 0 0 0  Tired, decreased energy 1 1 0 0 0  Change in appetite 0 0 0 0 0  Feeling bad or failure about yourself  0 0 0 0 0  Trouble concentrating 0 1 0 0 0  Moving slowly or fidgety/restless 0 0 0 0 0  Suicidal thoughts 0 0 0 0 0  PHQ-9 Score 3 3 0 0 0  Difficult doing work/chores Not difficult at all - - Not difficult at all Not difficult at all   Interpretation of Total Score  Total Score Depression Severity:  1-4 = Minimal  depression, 5-9 = Mild depression, 10-14 = Moderate depression, 15-19 = Moderately severe depression, 20-27 = Severe depression   Psychosocial Evaluation and Intervention:   Psychosocial Re-Evaluation:  Psychosocial Re-Evaluation     Row Name 06/08/24 1337 07/12/24 1212           Psychosocial Re-Evaluation   Current issues with Current Anxiety/Panic;Current Stress Concerns Current Anxiety/Panic;Current Stress Concerns      Comments Dekota did mention that she was supposed to have hip surgery before she had her MI. Will review quality of life in the upcoming week Quality of life and PHQ9 reviewed. Zaelyn she has experienced anxiety regarding her recent MI and stenting. Deola denies being depressed.  Tacarra has fibromyalgia and thinks she was slow to recognize anginal symptoms due to this. Hortense says she has had low energy post MI. Aarin became tearful as we talked. Offered emotional support and reassurance. Nimsi may benefit from counseling. Adela said she will think about proceeding with counseling and let us  know if she wants a referral placed.      Expected Outcomes Aritzel will have controlled or decreased stressors/ anxiety upon compleiton of cardiac rehab Clarke will have controlled or decreased stressors/ anxiety upon compleiton of cardiac rehab      Interventions Stress management education;Relaxation education;Encouraged to attend Cardiac Rehabilitation for the exercise Stress management education;Relaxation education;Encouraged to attend Cardiac Rehabilitation for the exercise      Continue Psychosocial Services  Follow up required by staff Follow up required by staff        Initial Review   Source of Stress Concerns Chronic Illness Chronic Illness      Comments will continue to monitor and offer support as needed. will continue to monitor and offer support as needed.         Psychosocial Discharge (Final Psychosocial Re-Evaluation):  Psychosocial Re-Evaluation - 07/12/24 1212        Psychosocial Re-Evaluation   Current issues with Current Anxiety/Panic;Current Stress Concerns    Comments Quality of life and PHQ9 reviewed. Quinlee she has experienced anxiety regarding her recent MI and stenting. Mercadez denies being depressed.  Marnette has fibromyalgia and thinks she was slow to recognize anginal symptoms due to this. Sharen says she has had low energy post MI. Melvina became tearful as we talked. Offered emotional support and reassurance. Ilithyia may benefit from counseling. Kayelyn said she will think about proceeding with counseling and let us  know if she wants a referral placed.    Expected Outcomes Fonnie will have controlled or decreased stressors/ anxiety upon compleiton of cardiac rehab    Interventions Stress management education;Relaxation education;Encouraged to attend Cardiac Rehabilitation for the exercise    Continue Psychosocial Services  Follow up required by staff  Initial Review   Source of Stress Concerns Chronic Illness    Comments will continue to monitor and offer support as needed.          Vocational Rehabilitation: Provide vocational rehab assistance to qualifying candidates.   Vocational Rehab Evaluation & Intervention:  Vocational Rehab - 06/02/24 1535       Initial Vocational Rehab Evaluation & Intervention   Assessment shows need for Vocational Rehabilitation No   retired         Education: Education Goals: Education classes will be provided on a weekly basis, covering required topics. Participant will state understanding/return demonstration of topics presented.    Education     Row Name 06/08/24 1000     Education   Cardiac Education Topics [employer]in   Orthoptist   Educator Dietitian   Weekly Topic Fast and Healthy Breakfasts   Instruction Review Code 1- Verbalizes Understanding    Row Name 06/10/24 1000     Education   Cardiac Education Topics [employer]ritikin   Select Core Videos      Core Videos   Educator Dietitian   Select Nutrition   Nutrition Other   Instruction Review Code 1- Verbalizes Understanding   Class Start Time 1145   Class Stop Time 1225   Class Time Calculation (min) 40 min    Row Name 06/13/24 1100     Education   Cardiac Education Topics [facility]in   Hospital doctor Education   General Education Metabolic Syndrome and Belly Fat   Instruction Review Code 1- Verbalizes Understanding   Class Start Time 1200   Class Stop Time 1235   Class Time Calculation (min) 35 min    Row Name 06/15/24 1100     Education   Cardiac Education Topics Pritikin   Customer service manager   Weekly Topic Personalizing Your Pritikin Plate   Instruction Review Code 1- Verbalizes Understanding   Class Start Time 1145   Class Stop Time 1225   Class Time Calculation (min) 40 min    Row Name 06/17/24 1100     Education   Cardiac Education Topics [employer]in   Licensed conveyancer Nutrition   Nutrition Overview of the Pritikin Eating Plan   Instruction Review Code [id number] Understanding    Row Name 06/20/24 1100     Education   Cardiac Education Topics [employer]ritikin   Select Workshops     Workshops   Educator Exercise Physiologist   Select Psychosocial   Psychosocial Workshop Recognizing and Reducing Stress   Instruction Review Code 1- Verbalizes Understanding   Class Start Time 1150   Class Stop Time 1232   Class Time Calculation (min) 42 min    Row Name 06/22/24 1100     Education   Cardiac Education Topics Pritikin   Orthoptist   Educator Dietitian   Weekly Topic [employer] Desserts   Instruction Review Code 1- Verbalizes Understanding   Class Start Time 1145   Class Stop Time 1225   Class Time Calculation (min) 40 min    Row Name 07/06/24 1100      Education   Cardiac Education Topics Pritikin   [employer]   Water quality scientist   Instruction Review Code 1- Verbalizes Understanding   Class Start Time 1146   Class Stop Time 1225   Class Time Calculation (min) 39 min      Core Videos: Exercise    Move It!  Clinical staff conducted group or individual video education with verbal and written material and guidebook.  Patient learns the recommended Pritikin exercise program. Exercise with the goal of living a long, healthy life. Some of the health benefits of exercise include controlled diabetes, healthier blood pressure levels, improved cholesterol levels, improved heart and lung capacity, improved sleep, and better body composition. Everyone should speak with their doctor before starting or changing an exercise routine.  Biomechanical Limitations Clinical staff conducted group or individual video education with verbal and written material and guidebook.  Patient learns how biomechanical limitations can impact exercise and how we can mitigate and possibly overcome limitations to have an impactful and balanced exercise routine.  Body Composition Clinical staff conducted group or individual video education with verbal and written material and guidebook.  Patient learns that body composition (ratio of muscle mass to fat mass) is a key component to assessing overall fitness, rather than body weight alone. Increased fat mass, especially visceral belly fat, can put us  at increased risk for metabolic syndrome, type 2 diabetes, heart disease, and even death. It is recommended to combine diet and exercise (cardiovascular and resistance training) to improve your body composition. Seek guidance from your physician and exercise physiologist before implementing an exercise routine.  Exercise Action Plan Clinical staff conducted group or individual video education with verbal and written  material and guidebook.  Patient learns the recommended strategies to achieve and enjoy long-term exercise adherence, including variety, self-motivation, self-efficacy, and positive decision making. Benefits of exercise include fitness, good health, weight management, more energy, better sleep, less stress, and overall well-being.  Medical   Heart Disease Risk Reduction Clinical staff conducted group or individual video education with verbal and written material and guidebook.  Patient learns our heart is our most vital organ as it circulates oxygen, nutrients, white blood cells, and hormones throughout the entire body, and carries waste away. Data supports a plant-based eating plan like the Pritikin Program for its effectiveness in slowing progression of and reversing heart disease. The video provides a number of recommendations to address heart disease.   Metabolic Syndrome and Belly Fat  Clinical staff conducted group or individual video education with verbal and written material and guidebook.  Patient learns what metabolic syndrome is, how it leads to heart disease, and how one can reverse it and keep it from coming back. You have metabolic syndrome if you have 3 of the following 5 criteria: abdominal obesity, high blood pressure, high triglycerides, low HDL cholesterol, and high blood sugar.  Hypertension and Heart Disease Clinical staff conducted group or individual video education with verbal and written material and guidebook.  Patient learns that high blood pressure, or hypertension, is very common in the [place] . Hypertension is largely due to excessive salt intake, but other important risk factors include being overweight, physical inactivity, drinking too much alcohol, smoking, and not eating enough potassium from fruits and vegetables. High blood pressure is a leading risk factor for heart attack, stroke, congestive heart failure, dementia, kidney failure, and premature death.  Long-term effects of excessive salt intake include stiffening of the arteries and thickening of heart muscle and organ damage. Recommendations include ways to reduce  hypertension and the risk of heart disease.  Diseases of Our Time - Focusing on Diabetes Clinical staff conducted group or individual video education with verbal and written material and guidebook.  Patient learns why the best way to stop diseases of our time is prevention, through food and other lifestyle changes. Medicine (such as prescription pills and surgeries) is often only a Band-Aid on the problem, not a long-term solution. Most common diseases of our time include obesity, type 2 diabetes, hypertension, heart disease, and cancer. The Pritikin Program is recommended and has been proven to help reduce, reverse, and/or prevent the damaging effects of metabolic syndrome.  Nutrition   Overview of the Pritikin Eating Plan  Clinical staff conducted group or individual video education with verbal and written material and guidebook.  Patient learns about the Pritikin Eating Plan for disease risk reduction. The Pritikin Eating Plan emphasizes a wide variety of unrefined, minimally-processed carbohydrates, like fruits, vegetables, whole grains, and legumes. Go, Caution, and Stop food choices are explained. Plant-based and lean animal proteins are emphasized. Rationale provided for low sodium intake for blood pressure control, low added sugars for blood sugar stabilization, and low added fats and oils for coronary artery disease risk reduction and weight management.  Calorie Density  Clinical staff conducted group or individual video education with verbal and written material and guidebook.  Patient learns about calorie density and how it impacts the Pritikin Eating Plan. Knowing the characteristics of the food you choose will help you decide whether those foods will lead to weight gain or weight loss, and whether you want to consume more or  less of them. Weight loss is usually a side effect of the Pritikin Eating Plan because of its focus on low calorie-dense foods.  Label Reading  Clinical staff conducted group or individual video education with verbal and written material and guidebook.  Patient learns about the Pritikin recommended label reading guidelines and corresponding recommendations regarding calorie density, added sugars, sodium content, and whole grains.  Dining Out - Part 1  Clinical staff conducted group or individual video education with verbal and written material and guidebook.  Patient learns that restaurant meals can be sabotaging because they can be so high in calories, fat, sodium, and/or sugar. Patient learns recommended strategies on how to positively address this and avoid unhealthy pitfalls.  Facts on Fats  Clinical staff conducted group or individual video education with verbal and written material and guidebook.  Patient learns that lifestyle modifications can be just as effective, if not more so, as many medications for lowering your risk of heart disease. A Pritikin lifestyle can help to reduce your risk of inflammation and atherosclerosis (cholesterol build-up, or plaque, in the artery walls). Lifestyle interventions such as dietary choices and physical activity address the cause of atherosclerosis. A review of the types of fats and their impact on blood cholesterol levels, along with dietary recommendations to reduce fat intake is also included.  Nutrition Action Plan  Clinical staff conducted group or individual video education with verbal and written material and guidebook.  Patient learns how to incorporate Pritikin recommendations into their lifestyle. Recommendations include planning and keeping personal health goals in mind as an important part of their success.  Healthy Mind-Set    Healthy Minds, Bodies, Hearts  Clinical staff conducted group or individual video education with verbal and written  material and guidebook.  Patient learns how to identify when they are stressed. Video will discuss the impact of that stress, as well as  the many benefits of stress management. Patient will also be introduced to stress management techniques. The way we think, act, and feel has an impact on our hearts.  How Our Thoughts Can Heal Our Hearts  Clinical staff conducted group or individual video education with verbal and written material and guidebook.  Patient learns that negative thoughts can cause depression and anxiety. This can result in negative lifestyle behavior and serious health problems. Cognitive behavioral therapy is an effective method to help control our thoughts in order to change and improve our emotional outlook.  Additional Videos:  Exercise    Improving Performance  Clinical staff conducted group or individual video education with verbal and written material and guidebook.  Patient learns to use a non-linear approach by alternating intensity levels and lengths of time spent exercising to help burn more calories and lose more body fat. Cardiovascular exercise helps improve heart health, metabolism, hormonal balance, blood sugar control, and recovery from fatigue. Resistance training improves strength, endurance, balance, coordination, reaction time, metabolism, and muscle mass. Flexibility exercise improves circulation, posture, and balance. Seek guidance from your physician and exercise physiologist before implementing an exercise routine and learn your capabilities and proper form for all exercise.  Introduction to Yoga  Clinical staff conducted group or individual video education with verbal and written material and guidebook.  Patient learns about yoga, a discipline of the coming together of mind, breath, and body. The benefits of yoga include improved flexibility, improved range of motion, better posture and core strength, increased lung function, weight loss, and positive  self-image. Yoga's heart health benefits include lowered blood pressure, healthier heart rate, decreased cholesterol and triglyceride levels, improved immune function, and reduced stress. Seek guidance from your physician and exercise physiologist before implementing an exercise routine and learn your capabilities and proper form for all exercise.  Medical   Aging: Enhancing Your Quality of Life  Clinical staff conducted group or individual video education with verbal and written material and guidebook.  Patient learns key strategies and recommendations to stay in good physical health and enhance quality of life, such as prevention strategies, having an advocate, securing a Health Care Proxy and Power of Attorney, and keeping a list of medications and system for tracking them. It also discusses how to avoid risk for bone loss.  Biology of Weight Control  Clinical staff conducted group or individual video education with verbal and written material and guidebook.  Patient learns that weight gain occurs because we consume more calories than we burn (eating more, moving less). Even if your body weight is normal, you may have higher ratios of fat compared to muscle mass. Too much body fat puts you at increased risk for cardiovascular disease, heart attack, stroke, type 2 diabetes, and obesity-related cancers. In addition to exercise, following the Pritikin Eating Plan can help reduce your risk.  Decoding Lab Results  Clinical staff conducted group or individual video education with verbal and written material and guidebook.  Patient learns that lab test reflects one measurement whose values change over time and are influenced by many factors, including medication, stress, sleep, exercise, food, hydration, pre-existing medical conditions, and more. It is recommended to use the knowledge from this video to become more involved with your lab results and evaluate your numbers to speak with your  doctor.   Diseases of Our Time - Overview  Clinical staff conducted group or individual video education with verbal and written material and guidebook.  Patient learns that according to the CDC,  50% to 70% of chronic diseases (such as obesity, type 2 diabetes, elevated lipids, hypertension, and heart disease) are avoidable through lifestyle improvements including healthier food choices, listening to satiety cues, and increased physical activity.  Sleep Disorders Clinical staff conducted group or individual video education with verbal and written material and guidebook.  Patient learns how good quality and duration of sleep are important to overall health and well-being. Patient also learns about sleep disorders and how they impact health along with recommendations to address them, including discussing with a physician.  Nutrition  Dining Out - Part 2 Clinical staff conducted group or individual video education with verbal and written material and guidebook.  Patient learns how to plan ahead and communicate in order to maximize their dining experience in a healthy and nutritious manner. Included are recommended food choices based on the type of restaurant the patient is visiting.   Fueling a Banker conducted group or individual video education with verbal and written material and guidebook.  There is a strong connection between our food choices and our health. Diseases like obesity and type 2 diabetes are very prevalent and are in large-part due to lifestyle choices. The Pritikin Eating Plan provides plenty of food and hunger-curbing satisfaction. It is easy to follow, affordable, and helps reduce health risks.  Menu Workshop  Clinical staff conducted group or individual video education with verbal and written material and guidebook.  Patient learns that restaurant meals can sabotage health goals because they are often packed with calories, fat, sodium, and sugar.  Recommendations include strategies to plan ahead and to communicate with the manager, chef, or server to help order a healthier meal.  Planning Your Eating Strategy  Clinical staff conducted group or individual video education with verbal and written material and guidebook.  Patient learns about the Pritikin Eating Plan and its benefit of reducing the risk of disease. The Pritikin Eating Plan does not focus on calories. Instead, it emphasizes high-quality, nutrient-rich foods. By knowing the characteristics of the foods, we choose, we can determine their calorie density and make informed decisions.  Targeting Your Nutrition Priorities  Clinical staff conducted group or individual video education with verbal and written material and guidebook.  Patient learns that lifestyle habits have a tremendous impact on disease risk and progression. This video provides eating and physical activity recommendations based on your personal health goals, such as reducing LDL cholesterol, losing weight, preventing or controlling type 2 diabetes, and reducing high blood pressure.  Vitamins and Minerals  Clinical staff conducted group or individual video education with verbal and written material and guidebook.  Patient learns different ways to obtain key vitamins and minerals, including through a recommended healthy diet. It is important to discuss all supplements you take with your doctor.   Healthy Mind-Set    Smoking Cessation  Clinical staff conducted group or individual video education with verbal and written material and guidebook.  Patient learns that cigarette smoking and tobacco addiction pose a serious health risk which affects millions of people. Stopping smoking will significantly reduce the risk of heart disease, lung disease, and many forms of cancer. Recommended strategies for quitting are covered, including working with your doctor to develop a successful plan.  Culinary   Becoming a Corporate investment banker conducted group or individual video education with verbal and written material and guidebook.  Patient learns that cooking at home can be healthy, cost-effective, quick, and puts them in control. Keys to cooking  healthy recipes will include looking at your recipe, assessing your equipment needs, planning ahead, making it simple, choosing cost-effective seasonal ingredients, and limiting the use of added fats, salts, and sugars.  Cooking - Breakfast and Snacks  Clinical staff conducted group or individual video education with verbal and written material and guidebook.  Patient learns how important breakfast is to satiety and nutrition through the entire day. Recommendations include key foods to eat during breakfast to help stabilize blood sugar levels and to prevent overeating at meals later in the day. Planning ahead is also a key component.  Cooking - Educational psychologist conducted group or individual video education with verbal and written material and guidebook.  Patient learns eating strategies to improve overall health, including an approach to cook more at home. Recommendations include thinking of animal protein as a side on your plate rather than center stage and focusing instead on lower calorie dense options like vegetables, fruits, whole grains, and plant-based proteins, such as beans. Making sauces in large quantities to freeze for later and leaving the skin on your vegetables are also recommended to maximize your experience.  Cooking - Healthy Salads and Dressing Clinical staff conducted group or individual video education with verbal and written material and guidebook.  Patient learns that vegetables, fruits, whole grains, and legumes are the foundations of the Pritikin Eating Plan. Recommendations include how to incorporate each of these in flavorful and healthy salads, and how to create homemade salad dressings. Proper handling of ingredients is also covered.  Cooking - Soups and [employer] - Soups and Desserts Clinical staff conducted group or individual video education with verbal and written material and guidebook.  Patient learns that Pritikin soups and desserts make for easy, nutritious, and delicious snacks and meal components that are low in sodium, fat, sugar, and calorie density, while high in vitamins, minerals, and filling fiber. Recommendations include simple and healthy ideas for soups and desserts.   Overview     The Pritikin Solution Program Overview Clinical staff conducted group or individual video education with verbal and written material and guidebook.  Patient learns that the results of the Pritikin Program have been documented in more than 100 articles published in peer-reviewed journals, and the benefits include reducing risk factors for (and, in some cases, even reversing) high cholesterol, high blood pressure, type 2 diabetes, obesity, and more! An overview of the three key pillars of the Pritikin Program will be covered: eating well, doing regular exercise, and having a healthy mind-set.  WORKSHOPS  Exercise: Exercise Basics: Building Your Action Plan Clinical staff led group instruction and group discussion with PowerPoint presentation and patient guidebook. To enhance the learning environment the use of posters, models and videos may be added. At the conclusion of this workshop, patients will comprehend the difference between physical activity and exercise, as well as the benefits of incorporating both, into their routine. Patients will understand the FITT (Frequency, Intensity, Time, and Type) principle and how to use it to build an exercise action plan. In addition, safety concerns and other considerations for exercise and cardiac rehab will be addressed by the presenter. The purpose of this lesson is to promote a comprehensive and effective weekly exercise routine in order to improve patients' overall level of  fitness.   Managing Heart Disease: Your Path to a Healthier Heart Clinical staff led group instruction and group discussion with PowerPoint presentation and patient guidebook. To enhance the learning environment the use of posters, models  and videos may be added.At the conclusion of this workshop, patients will understand the anatomy and physiology of the heart. Additionally, they will understand how Pritikin's three pillars impact the risk factors, the progression, and the management of heart disease.  The purpose of this lesson is to provide a high-level overview of the heart, heart disease, and how the Pritikin lifestyle positively impacts risk factors.  Exercise Biomechanics Clinical staff led group instruction and group discussion with PowerPoint presentation and patient guidebook. To enhance the learning environment the use of posters, models and videos may be added. Patients will learn how the structural parts of their bodies function and how these functions impact their daily activities, movement, and exercise. Patients will learn how to promote a neutral spine, learn how to manage pain, and identify ways to improve their physical movement in order to promote healthy living. The purpose of this lesson is to expose patients to common physical limitations that impact physical activity. Participants will learn practical ways to adapt and manage aches and pains, and to minimize their effect on regular exercise. Patients will learn how to maintain good posture while sitting, walking, and lifting.  Balance Training and Fall Prevention  Clinical staff led group instruction and group discussion with PowerPoint presentation and patient guidebook. To enhance the learning environment the use of posters, models and videos may be added. At the conclusion of this workshop, patients will understand the importance of their sensorimotor skills (vision, proprioception, and the vestibular system)  in maintaining their ability to balance as they age. Patients will apply a variety of balancing exercises that are appropriate for their current level of function. Patients will understand the common causes for poor balance, possible solutions to these problems, and ways to modify their physical environment in order to minimize their fall risk. The purpose of this lesson is to teach patients about the importance of maintaining balance as they age and ways to minimize their risk of falling.  WORKSHOPS   Nutrition:  Fueling a Ship broker led group instruction and group discussion with PowerPoint presentation and patient guidebook. To enhance the learning environment the use of posters, models and videos may be added. Patients will review the foundational principles of the Pritikin Eating Plan and understand what constitutes a serving size in each of the food groups. Patients will also learn Pritikin-friendly foods that are better choices when away from home and review make-ahead meal and snack options. Calorie density will be reviewed and applied to three nutrition priorities: weight maintenance, weight loss, and weight gain. The purpose of this lesson is to reinforce (in a group setting) the key concepts around what patients are recommended to eat and how to apply these guidelines when away from home by planning and selecting Pritikin-friendly options. Patients will understand how calorie density may be adjusted for different weight management goals.  Mindful Eating  Clinical staff led group instruction and group discussion with PowerPoint presentation and patient guidebook. To enhance the learning environment the use of posters, models and videos may be added. Patients will briefly review the concepts of the Pritikin Eating Plan and the importance of low-calorie dense foods. The concept of mindful eating will be introduced as well as the importance of paying attention to internal hunger  signals. Triggers for non-hunger eating and techniques for dealing with triggers will be explored. The purpose of this lesson is to provide patients with the opportunity to review the basic principles of the Pritikin Eating Plan, discuss the value  of eating mindfully and how to measure internal cues of hunger and fullness using the Hunger Scale. Patients will also discuss reasons for non-hunger eating and learn strategies to use for controlling emotional eating.  Targeting Your Nutrition Priorities Clinical staff led group instruction and group discussion with PowerPoint presentation and patient guidebook. To enhance the learning environment the use of posters, models and videos may be added. Patients will learn how to determine their genetic susceptibility to disease by reviewing their family history. Patients will gain insight into the importance of diet as part of an overall healthy lifestyle in mitigating the impact of genetics and other environmental insults. The purpose of this lesson is to provide patients with the opportunity to assess their personal nutrition priorities by looking at their family history, their own health history and current risk factors. Patients will also be able to discuss ways of prioritizing and modifying the Pritikin Eating Plan for their highest risk areas  Menu  Clinical staff led group instruction and group discussion with PowerPoint presentation and patient guidebook. To enhance the learning environment the use of posters, models and videos may be added. Using menus brought in from [employer], or printed from [employer], patients will apply the Pritikin dining out guidelines that were presented in the [employer] video. Patients will also be able to practice these guidelines in a variety of provided scenarios. The purpose of this lesson is to provide patients with the opportunity to practice hands-on learning of the Pritikin Dining Out guidelines  with actual menus and practice scenarios.  Label Reading Clinical staff led group instruction and group discussion with PowerPoint presentation and patient guidebook. To enhance the learning environment the use of posters, models and videos may be added. Patients will review and discuss the Pritikin label reading guidelines presented in [employer]'s Label Reading Educational series video. Using fool labels brought in from local grocery stores and markets, patients will apply the label reading guidelines and determine if the packaged food meet the Pritikin guidelines. The purpose of this lesson is to provide patients with the opportunity to review, discuss, and practice hands-on learning of the Pritikin Label Reading guidelines with actual packaged food labels. Cooking School  [employer]'s [employer] are designed to teach patients ways to prepare quick, simple, and affordable recipes at home. The importance of nutrition's role in chronic disease risk reduction is reflected in its emphasis in the overall Pritikin program. By learning how to prepare essential core Pritikin Eating Plan recipes, patients will increase control over what they eat; be able to customize the flavor of foods without the use of added salt, sugar, or fat; and improve the quality of the food they consume. By learning a set of core recipes which are easily assembled, quickly prepared, and affordable, patients are more likely to prepare more healthy foods at home. These workshops focus on convenient breakfasts, simple entres, side dishes, and desserts which can be prepared with minimal effort and are consistent with nutrition recommendations for cardiovascular risk reduction. Cooking [employer] are taught by a Armed forces logistics/support/administrative officer (RD) who has been trained by the [employer]. The chef or RD has a clear understanding of the importance of minimizing - if not completely eliminating - added fat, sugar, and  sodium in recipes. Throughout the series of Cooking School Workshop sessions, patients will learn about healthy ingredients and efficient methods of cooking to build confidence in their capability to prepare    [employer]  weekly topics:  Adding Flavor- Sodium-Free  Fast and Healthy Breakfasts  Powerhouse Plant-Based Proteins  Satisfying Salads and Dressings  Simple Sides and Sauces  International Cuisine-Spotlight on the Blue Zones  Delicious Desserts  Savory Soups  Efficiency Cooking - Meals in a Snap  Tasty Appetizers and Snacks  Comforting Weekend Breakfasts  One-Pot Wonders   Fast Evening Meals  Landscape architect Your Pritikin Plate  WORKSHOPS   Healthy Mindset (Psychosocial):  Focused Goals, Sustainable Changes Clinical staff led group instruction and group discussion with PowerPoint presentation and patient guidebook. To enhance the learning environment the use of posters, models and videos may be added. Patients will be able to apply effective goal setting strategies to establish at least one personal goal, and then take consistent, meaningful action toward that goal. They will learn to identify common barriers to achieving personal goals and develop strategies to overcome them. Patients will also gain an understanding of how our mind-set can impact our ability to achieve goals and the importance of cultivating a positive and growth-oriented mind-set. The purpose of this lesson is to provide patients with a deeper understanding of how to set and achieve personal goals, as well as the tools and strategies needed to overcome common obstacles which may arise along the way.  From Head to Heart: The Power of a Healthy Outlook  Clinical staff led group instruction and group discussion with PowerPoint presentation and patient guidebook. To enhance the learning environment the use of posters, models and videos may be added. Patients will be able to recognize and  describe the impact of emotions and mood on physical health. They will discover the importance of self-care and explore self-care practices which may work for them. Patients will also learn how to utilize the 4 C's to cultivate a healthier outlook and better manage stress and challenges. The purpose of this lesson is to demonstrate to patients how a healthy outlook is an essential part of maintaining good health, especially as they continue their cardiac rehab journey.  Healthy Sleep for a Healthy Heart Clinical staff led group instruction and group discussion with PowerPoint presentation and patient guidebook. To enhance the learning environment the use of posters, models and videos may be added. At the conclusion of this workshop, patients will be able to demonstrate knowledge of the importance of sleep to overall health, well-being, and quality of life. They will understand the symptoms of, and treatments for, common sleep disorders. Patients will also be able to identify daytime and nighttime behaviors which impact sleep, and they will be able to apply these tools to help manage sleep-related challenges. The purpose of this lesson is to provide patients with a general overview of sleep and outline the importance of quality sleep. Patients will learn about a few of the most common sleep disorders. Patients will also be introduced to the concept of "sleep hygiene," and discover ways to self-manage certain sleeping problems through simple daily behavior changes. Finally, the workshop will motivate patients by clarifying the links between quality sleep and their goals of heart-healthy living.   Recognizing and Reducing Stress Clinical staff led group instruction and group discussion with PowerPoint presentation and patient guidebook. To enhance the learning environment the use of posters, models and videos may be added. At the conclusion of this workshop, patients will be able to understand the types of stress  reactions, differentiate between acute and chronic stress, and recognize the impact that chronic stress has on their health. They will also be  able to apply different coping mechanisms, such as reframing negative self-talk. Patients will have the opportunity to practice a variety of stress management techniques, such as deep abdominal breathing, progressive muscle relaxation, and/or guided imagery.  The purpose of this lesson is to educate patients on the role of stress in their lives and to provide healthy techniques for coping with it.  Learning Barriers/Preferences:  Learning Barriers/Preferences - 06/02/24 1534       Learning Barriers/Preferences   Learning Barriers Sight    Learning Preferences Audio;Computer/Internet;Group Instruction;Individual Instruction;Skilled Demonstration;Verbal Instruction;Video;Written Material;Pictoral          Education Topics:  Knowledge Questionnaire Score:  Knowledge Questionnaire Score - 06/02/24 1535       Knowledge Questionnaire Score   Pre Score 23/24          Core Components/Risk Factors/Patient Goals at Admission:  Personal Goals and Risk Factors at Admission - 06/02/24 1535       Core Components/Risk Factors/Patient Goals on Admission    Weight Management Yes;Obesity;Weight Loss    Intervention Weight Management: Develop a combined nutrition and exercise program designed to reach desired caloric intake, while maintaining appropriate intake of nutrient and fiber, sodium and fats, and appropriate energy expenditure required for the weight goal.;Weight Management: Provide education and appropriate resources to help participant work on and attain dietary goals.;Weight Management/Obesity: Establish reasonable short term and long term weight goals.;Obesity: Provide education and appropriate resources to help participant work on and attain dietary goals.    Goal Weight: Long Term 168 lb (76.2 kg)   pt goal   Hypertension Yes    Intervention  Provide education on lifestyle modifcations including regular physical activity/exercise, weight management, moderate sodium restriction and increased consumption of fresh fruit, vegetables, and low fat dairy, alcohol moderation, and smoking cessation.;Monitor prescription use compliance.    Expected Outcomes Short Term: Continued assessment and intervention until BP is < 140/64mm HG in hypertensive participants. < 130/27mm HG in hypertensive participants with diabetes, heart failure or chronic kidney disease.;Long Term: Maintenance of blood pressure at goal levels.    Lipids Yes    Intervention Provide education and support for participant on nutrition & aerobic/resistive exercise along with prescribed medications to achieve LDL 70mg , HDL >40mg .    Expected Outcomes Short Term: Participant states understanding of desired cholesterol values and is compliant with medications prescribed. Participant is following exercise prescription and nutrition guidelines.;Long Term: Cholesterol controlled with medications as prescribed, with individualized exercise RX and with personalized nutrition plan. Value goals: LDL < 70mg , HDL > 40 mg.    Stress Yes    Intervention Refer participants experiencing significant psychosocial distress to appropriate mental health specialists for further evaluation and treatment. When possible, include family members and significant others in education/counseling sessions.;Offer individual and/or small group education and counseling on adjustment to heart disease, stress management and health-related lifestyle change. Teach and support self-help strategies.    Expected Outcomes Short Term: Participant demonstrates changes in health-related behavior, relaxation and other stress management skills, ability to obtain effective social support, and compliance with psychotropic medications if prescribed.;Long Term: Emotional wellbeing is indicated by absence of clinically significant  psychosocial distress or social isolation.          Core Components/Risk Factors/Patient Goals Review:   Goals and Risk Factor Review     Row Name 06/08/24 1409 07/12/24 1213           Core Components/Risk Factors/Patient Goals Review   Personal Goals Review Weight Management/Obesity;Hypertension;Lipids;Stress Weight Management/Obesity;Hypertension;Lipids;Stress  Review Tangy started cardiac rehab on 06/08/24. Teofila did well with exercise. Vital signs were stable. Taneika returned to exercise at  cardiac rehab on 07/06/24 Vital signs have been stable.      Expected Outcomes Iness will continue to participate in cardiac rehab for exercise, nutrition and lifestyle modificaitons Lovelee will continue to participate in cardiac rehab for exercise, nutrition and lifestyle modificaitons         Core Components/Risk Factors/Patient Goals at Discharge (Final Review):   Goals and Risk Factor Review - 07/12/24 1213       Core Components/Risk Factors/Patient Goals Review   Personal Goals Review Weight Management/Obesity;Hypertension;Lipids;Stress    Review Azadeh returned to exercise at  cardiac rehab on 07/06/24 Vital signs have been stable.    Expected Outcomes Daisey will continue to participate in cardiac rehab for exercise, nutrition and lifestyle modificaitons          ITP Comments:  ITP Comments     Row Name 06/02/24 1312 06/08/24 1336 07/12/24 1210       ITP Comments Dr. Wilbert Bihari medical director. Introduction to pritikin education/intensive cardiac rehab. Initial orientation packet reviewed with patient. 30 Day ITP Review. Mysty started cardiac rehab on 06/08/24. Ezra did well with exercise. 30 Day ITP Review. Moselle returned to exercise at cardiac rehab on 07/06/24. Ravinder did well with exercise. Soft exsisting  radial hematoma present from cath        Comments: See ITP Comments

## 2024-07-13 ENCOUNTER — Encounter (HOSPITAL_COMMUNITY)

## 2024-07-15 ENCOUNTER — Encounter (HOSPITAL_COMMUNITY)

## 2024-07-18 ENCOUNTER — Encounter (HOSPITAL_COMMUNITY)

## 2024-07-20 ENCOUNTER — Encounter (HOSPITAL_COMMUNITY)

## 2024-07-20 NOTE — Progress Notes (Signed)
 Cardiology Office Note   Date:  08/02/2024  ID:  Kathy Stephens, DOB [birth year], MRN [id number] PCP: Kathy Toribio POUR, MD  [facility] Providers Cardiologist:  Kathy Cash, MD Cardiology APP:  Kathy Rollo SAUNDERS, PA-C    History of Present Illness Kathy Stephens is a 63 y.o. female with a past medical history of CAD, HTN, HLD, tobacco use, multiple TIAs, splenic infarct. Patient followed by Dr. Cash and presents today for a hospital follow up appointment.   Patient first established care with Dr. Elmira in 2022 for evaluation of chest pain. Nuclear stress test in 03/2021 showed normal myocardial perfusion, normal EF at 56%. CT cardiac scoring in 04/2021 showed a coronary calcium  score of 0. Echocardiogram in 04/2022 showed EF 60-65%, no regional wall motion abnormalities, mild LVH, normal LV diastolic parameters, normal RV systolic function, no significant valvular abnormalities. Cardiac monitor in 06/2023 showed sinus rhythm, no atrial fibrillation, rare PACs and PVCs.    Admitted 6/29-05/03/24 with NSTEMI. Presented complaining of chest pain. hsTn [id number]. Underwent cath 6/30 that showed severe single-vessel CAD with 99% subtotal occlusion of the proximal-mid LAD. This was treated with DES. Echocardiogram showed EF 55% with possible wall motion abnormalities, normal RV systolic function, no significant valvular abnormalities. Discharged on ASA 81 mg daily, Brilinta  90 mg BID, lisinopril  20 mg daily, crestor  10 mg daily  She was seen in the ED on 8/22 with chest pain. HsTn negative x2. Was set up for outpatient cath on 06/28/24 that showed widely patent stent in the prox-mid LAD, 50% stenosis 1st diagonal (improved from previous cath), 40% mid LAD, 45% distal LAD. Recommended ongoing medical management   Today, patient reports that she has only had 1 episode of chest pain since being seen in the ED on 8/22.  [place] and felt a tightness  on both sides of her ribs.  She checked her blood pressure and it was very elevated.  After breathing deeply and resting for a few moments, blood pressure improved as did the chest squeezing. No chest pain otherwise. She has been participating in cardiac rehab and has been tolerating it well.  Denies chest pain on exertion.  She has had mild shortness of breath on exertion since being discharged from the hospital.  She thinks that this may be related to her Brilinta .  Reports that the shortness of breath does not limit her physical activities and does not bother her much.  We discussed possibly transitioning to Plavix  but patient would prefer not to. Reports that the shortness of breath is not bad and she previously had diarrhea on Plavix .  She denies dizziness, syncope, near syncope.  Her radial cath site has healed well.  Studies Reviewed Cardiac Studies & Procedures   ______________________________________________________________________________________________ CARDIAC CATHETERIZATION  CARDIAC CATHETERIZATION 06/28/2024  Conclusion Images from the original result were not included.    Previously placed Prox LAD to Mid LAD Drug-Eluting Stent is  widely patent with 50% stenosed side branch in 1st Diag.  (Improved from post PCI)   Mid LAD lesion is 40% stenosed. Dist LAD lesion is 45% stenosed.   LV end diastolic pressure is normal.   There is no aortic valve stenosis.  Dominance: Right  RECOMMENDATIONS   Anticipated discharge date to be determined.   Would be okay to discharge as of 8/26, after bedrest   Continue current DAPT regimen   Otherwise minimal CAD.    Alm Clay, MD  Findings  Coronary Findings Diagnostic  Dominance: Right  Left Main Vessel was injected. Vessel is normal in caliber. Vessel is angiographically normal.  Left Anterior Descending Previously placed Prox LAD to Mid LAD stent of unknown type is  widely patent with 50% stenosed side branch in 1st Diag. Vessel  is the culprit lesion. The lesion is type B2 and located at the major branch. Previously placed stent displays no restenosis. Mid LAD lesion is 40% stenosed. Dist LAD lesion is 45% stenosed.  First Diagonal Branch Vessel is small in size. Very small caliber vessel  First Septal Branch Vessel is small in size.  Second Diagonal Branch Vessel is small in size.  Left Circumflex Vessel is normal in caliber.  First Obtuse Marginal Branch Vessel is normal in size.  Second Obtuse Marginal Branch Vessel is small in size.  Left Posterior Atrioventricular Artery Vessel is small in size.  Right Coronary Artery Vessel was injected. Vessel is large. Vessel is angiographically normal.  Right Ventricular Branch Vessel is small in size.  First Right Posterolateral Branch Vessel is small in size.  Intervention  No interventions have been documented.   CARDIAC CATHETERIZATION  CARDIAC CATHETERIZATION 05/02/2024  Conclusion Images from the original result were not included.    Prox LAD to Mid LAD lesion is 99% stenosed with 90% stenosed side branch in 1st Diag.   A drug-eluting stent was successfully placed using a STENT SYNERGY XD 2.75X16 => deployed to 3.0 mm. Post intervention, there is a 0% residual stenosis.  TIMI-3 flow maintained   Post intervention, the small side branch remained stable with 90% residual stenosis.  (Not big enough for PTCA)   Mid LAD lesion is 40% stenosed. Dist LAD lesion is 45% stenosed.   ----------------------------------   The left ventricular ejection fraction is 50-55% by visual estimate.   There is no aortic valve stenosis.  Diagnostic  Dominance: Right     Intervention  POST-CATH-DIAGNOSIS Severe single-vessel CAD with 99% subtotal occlusion of the proximal to mid LAD treated successfully with a Synergy XD 3.75 mm x 16 mm stent deployed to 3.0 mm. Preserved/normal LVEF with normal LVEDP  RECOMMENDATIONS   In the absence of any other  complications or medical issues, we expect the patient to be ready for discharge from an interventional cardiology perspective on 05/02/2024.   Defer timing of discharge to rounding MD. May need time for additional titration of GDMT for CAD.   Recommend uninterrupted dual antiplatelet therapy with Aspirin  81mg  daily and Ticagrelor  90mg  twice daily for a minimum of 12 months (ACS-Class I recommendation).   After 1 year, would consider switching to Thienopyridine monotherapy that would be interruptible.  (Maintenance dose Brilinta  60 mg twice daily or Plavix  75 mg daily)    Alm Clay, MD     Alm MICAEL Clay, MD, MS Alm Clay, M.D., M.S. Interventional Cardiologist [facility] Pager # [phone]  Findings Coronary Findings Diagnostic  Dominance: Right  Left Main Vessel was injected. Vessel is normal in caliber.  Left Anterior Descending Prox LAD to Mid LAD lesion is 99% stenosed with 90% stenosed side branch in 1st Diag. Vessel is the culprit lesion. The lesion is type B2, located at the major branch, eccentric, thrombotic and ulcerative. Mid LAD lesion is 40% stenosed. Dist LAD lesion is 45% stenosed.  First Diagonal Branch Vessel is small in size.  First Septal Branch Vessel is small in size.  Second Diagonal Branch Vessel is small in size.  Left Circumflex Vessel is large. Vessel  is angiographically normal.  First Obtuse Marginal Branch Vessel is small in size.  Second Obtuse Marginal Branch Vessel is small in size.  Left Posterior Atrioventricular Artery Vessel is small in size.  Right Coronary Artery Vessel was injected. Vessel is large. Vessel is angiographically normal.  Right Ventricular Branch Vessel is small in size.  First Right Posterolateral Branch Vessel is small in size.  Intervention  Prox LAD to Mid LAD lesion with side branch in 1st Diag Stent - Main Branch Lesion length:  15 mm. CATH LAUNCHER 6FR EBU 3 guide catheter  was inserted. Lesion crossed with guidewire using a WIRE ASAHI PROWATER 180CM. Pre-stent angioplasty was performed using a BALLOON EMERGE MR 2.5X15. Maximum pressure:  10 atm. Inflation time: 20 sec. A drug-eluting stent was successfully placed using a STENT SYNERGY XD 2.75X16. Maximum pressure: 16 atm. Inflation time: 30 sec. Post-stent angioplasty was performed. Maximum pressure:  20 atm. Inflation time:  20 sec. Stent balloon, and high atm Post-Intervention Lesion Assessment The intervention was successful. Pre-interventional TIMI flow is 3. Post-intervention TIMI flow is 3. Treated lesion length:  16 mm. No complications occurred at this lesion. There is a 0% residual stenosis in the main branch post intervention. There is a 90% residual stenosis in the side branch post intervention.   STRESS TESTS  PCV MYOCARDIAL PERFUSION WO LEXISCAN  03/25/2021  Narrative Exercise Sestamibi Stress Test 03/25/2021: Normal ECG stress. The patient exercised for 9 minutes and 0 seconds of a Bruce protocol, achieving approximately 10.16 METs. Exercise was terminated due to fatigue/weakness.  Normal BP response.Normal exercise capacity. Myocardial perfusion is normal. Overall LV systolic function is normal without regional wall motion abnormalities. Stress LV EF: 56%. No previous exam available for comparison. Low risk.   ECHOCARDIOGRAM  ECHOCARDIOGRAM COMPLETE 05/02/2024  Narrative ECHOCARDIOGRAM REPORT    Patient Name:   Kathy Stephens Date of Exam: 05/02/2024 Medical Rec #:  [id number]      Height:       62.0 in Accession #:    [id number]     Weight:       171.0 lb Date of Birth:  May 25, 1961     BSA:          1.789 m Patient Age:    62 years       BP:           155/72 mmHg Patient Gender: F              HR:           62 bpm. Exam Location:  Inpatient  Procedure: 2D Echo, Color Doppler and Cardiac Doppler (Both Spectral and Color Flow Doppler were utilized during procedure).  Indications:     NSTEMI I21.4  History:        Patient has prior history of Echocardiogram examinations, most recent 04/03/2022.  Sonographer:    Tinnie Gosling RDCS Referring Phys: [phone] SUBRINA SUNDIL  IMPRESSIONS   1. Left ventricular ejection fraction, by estimation, is 55%. The left ventricle has low normal function. On limited views, there appears to be mild hypokinesis of the anteroseptal wall. Left ventricular diastolic parameters were normal. 2. Right ventricular systolic function is normal. The right ventricular size is normal. 3. The mitral valve is normal in structure. No evidence of mitral valve regurgitation. No evidence of mitral stenosis. 4. The aortic valve is normal in structure. Aortic valve regurgitation is not visualized. No aortic stenosis is present. 5. The inferior vena cava is normal in size with  greater than 50% respiratory variability, suggesting right atrial pressure of 3 mmHg.  FINDINGS Left Ventricle: Left ventricular ejection fraction, by estimation, is 50 to 55%. The left ventricle has low normal function. The left ventricle demonstrates regional wall motion abnormalities. The left ventricular internal cavity size was normal in size. There is no left ventricular hypertrophy. Left ventricular diastolic parameters were normal.  Right Ventricle: The right ventricular size is normal. No increase in right ventricular wall thickness. Right ventricular systolic function is normal.  Left Atrium: Left atrial size was normal in size.  Right Atrium: Right atrial size was normal in size.  Pericardium: There is no evidence of pericardial effusion.  Mitral Valve: The mitral valve is normal in structure. No evidence of mitral valve regurgitation. No evidence of mitral valve stenosis.  Tricuspid Valve: The tricuspid valve is normal in structure. Tricuspid valve regurgitation is not demonstrated. No evidence of tricuspid stenosis.  Aortic Valve: The aortic valve is normal in structure.  Aortic valve regurgitation is not visualized. No aortic stenosis is present.  Pulmonic Valve: The pulmonic valve was normal in structure. Pulmonic valve regurgitation is not visualized. No evidence of pulmonic stenosis.  Aorta: The aortic root is normal in size and structure.  Venous: The inferior vena cava is normal in size with greater than 50% respiratory variability, suggesting right atrial pressure of 3 mmHg.  IAS/Shunts: No atrial level shunt detected by color flow Doppler.   LEFT VENTRICLE PLAX 2D LVIDd:         4.20 cm   Diastology LVIDs:         3.10 cm   LV e' medial:  10.10 cm/s LV PW:         1.00 cm   LV e' lateral: 10.30 cm/s LV IVS:        0.90 cm LVOT diam:     2.00 cm LV SV:         74 LV SV Index:   41 LVOT Area:     3.14 cm   RIGHT VENTRICLE             IVC RV S prime:     14.60 cm/s  IVC diam: 1.40 cm TAPSE (M-mode): 1.9 cm  LEFT ATRIUM             Index        RIGHT ATRIUM          Index LA diam:        3.40 cm 1.90 cm/m   RA Area:     8.25 cm LA Vol (A2C):   32.7 ml 18.28 ml/m  RA Volume:   14.80 ml 8.27 ml/m LA Vol (A4C):   41.3 ml 23.09 ml/m LA Biplane Vol: 38.3 ml 21.41 ml/m AORTIC VALVE LVOT Vmax:   110.00 cm/s LVOT Vmean:  71.000 cm/s LVOT VTI:    0.234 m  AORTA Ao Root diam: 2.70 cm Ao Asc diam:  3.40 cm   SHUNTS Systemic VTI:  0.23 m Systemic Diam: 2.00 cm  Aditya Sabharwal Electronically signed by Ria Commander Signature Date/Time: 05/02/2024/4:54:00 PM    Final    MONITORS  CARDIAC EVENT MONITOR 06/30/2023  Narrative   Sinus rhythm   No atrial fibrillation   Rare PAC/PVC - benign       ______________________________________________________________________________________________       Risk Assessment/Calculations       STOP-Bang Score:  4      Physical Exam VS:  BP 118/72   Pulse ROLLEN)  56   Ht 5' 2 (1.575 m)   Wt 173 lb 12.8 oz (78.8 kg)   LMP 11/03/2006   SpO2 100%   BMI 31.79 kg/m        Wt  Readings from Last 3 Encounters:  08/02/24 173 lb 12.8 oz (78.8 kg)  08/02/24 174 lb 12.8 oz (79.3 kg)  06/28/24 175 lb (79.4 kg)    GEN: Well nourished, well developed in no acute distress. Sitting comfortably on the exam table  NECK: No JVD; No carotid bruits CARDIAC:  RRR, no murmurs, rubs, gallops. Radial pulses 2+ bilaterally. Right radial cath site soft, nontender   RESPIRATORY:  Clear to auscultation without rales, wheezing or rhonchi. Normal WOB on room air   ABDOMEN: Soft, non-tender, non-distended EXTREMITIES:  No edema in BLE; No deformity   ASSESSMENT AND PLAN  CAD - Admitted 6/29-05/03/24 with NTSEMI. hsTn peaked at 360. Cath showed evere single-vessel CAD with 99% subtotal occlusion of the proximal-mid LAD. This was treated with DES. -  More recently seen in the ED on 8/22 with chest pain, negative troponin. Was set up for cath on 8/26 that showed patent prox-mid LAD stent, otherwise minimal CAD  - Continue ASA 81 mg daily and Brilinta  90 mg BID for 12 months  - Continue imdur  30 mg daily  - Continue crestor  10 mg daily- she previously had myalgias on lipitor so was started on low dose crestor . She is tolerating this OK, but does have some body aches. Discussed lipid clinic referral, patient OK to remain on crestor  for now    HTN  - BP well controlled. No dizziness  - Continue lisinopril  10 mg daily and imdur  30 mg daily  -  Creatinine 1.01 and K 4.7 on 8/22   HLD  - Lipid panel from 6/30 showed LDL 139, HDL 58, triglycerides 114, total cholesterol 220  - Started on crestor  10 mg daily in 04/2024  - Ordered repeat Lipids, LFTs - patient is not fasting today. Will return for labs later this week  - Continue crestor  10 mg daily  - patient does have mild body aches, but they are tolerable. Offered lipid clinic referral, but patient OK to stay on crestor  for now   Fatigue  - Patient report that she has been fatigued lately.  Feels like she is sleeping all of the time, but  continues to have daytime fatigue. -STOP-BANG 4.  Offered sleep study but patient is not interested at this time.  TSH was normal in 05/2024, hemoglobin 12.8 in 06/2024   Dispo: Follow-up with Dr. Verlin  in 6 months   Signed, Rollo FABIENE Louder, PA-C

## 2024-07-22 ENCOUNTER — Encounter (HOSPITAL_COMMUNITY)

## 2024-07-25 ENCOUNTER — Encounter (HOSPITAL_COMMUNITY)
Admission: RE | Admit: 2024-07-25 | Discharge: 2024-07-25 | Disposition: A | Discharge status: Home / Self Care | Source: Ambulatory Visit | Attending: Cardiology

## 2024-07-25 DIAGNOSIS — I252 Old myocardial infarction: Secondary | ICD-10-CM | POA: Diagnosis not present

## 2024-07-25 DIAGNOSIS — Z955 Presence of coronary angioplasty implant and graft: Secondary | ICD-10-CM

## 2024-07-25 DIAGNOSIS — I214 Non-ST elevation (NSTEMI) myocardial infarction: Secondary | ICD-10-CM

## 2024-07-25 DIAGNOSIS — M542 Cervicalgia: Secondary | ICD-10-CM

## 2024-07-27 ENCOUNTER — Encounter (HOSPITAL_COMMUNITY)
Admission: RE | Admit: 2024-07-27 | Discharge: 2024-07-27 | Disposition: A | Discharge status: Home / Self Care | Source: Ambulatory Visit | Attending: Cardiology | Admitting: Cardiology

## 2024-07-27 DIAGNOSIS — I214 Non-ST elevation (NSTEMI) myocardial infarction: Secondary | ICD-10-CM

## 2024-07-27 DIAGNOSIS — Z955 Presence of coronary angioplasty implant and graft: Secondary | ICD-10-CM

## 2024-07-27 DIAGNOSIS — I252 Old myocardial infarction: Secondary | ICD-10-CM | POA: Diagnosis not present

## 2024-07-29 ENCOUNTER — Encounter (HOSPITAL_COMMUNITY)
Admission: RE | Admit: 2024-07-29 | Discharge: 2024-07-29 | Disposition: A | Discharge status: Home / Self Care | Source: Ambulatory Visit | Attending: Cardiology | Admitting: Cardiology

## 2024-07-29 DIAGNOSIS — I252 Old myocardial infarction: Secondary | ICD-10-CM | POA: Diagnosis not present

## 2024-07-29 DIAGNOSIS — Z955 Presence of coronary angioplasty implant and graft: Secondary | ICD-10-CM

## 2024-07-29 DIAGNOSIS — I214 Non-ST elevation (NSTEMI) myocardial infarction: Secondary | ICD-10-CM

## 2024-08-01 ENCOUNTER — Encounter (HOSPITAL_COMMUNITY)
Admission: RE | Admit: 2024-08-01 | Discharge: 2024-08-01 | Disposition: A | Source: Ambulatory Visit | Attending: Cardiology

## 2024-08-01 DIAGNOSIS — I252 Old myocardial infarction: Secondary | ICD-10-CM | POA: Diagnosis not present

## 2024-08-01 DIAGNOSIS — Z955 Presence of coronary angioplasty implant and graft: Secondary | ICD-10-CM

## 2024-08-01 DIAGNOSIS — I214 Non-ST elevation (NSTEMI) myocardial infarction: Secondary | ICD-10-CM

## 2024-08-02 ENCOUNTER — Encounter: Payer: Self-pay | Admitting: Cardiology

## 2024-08-02 ENCOUNTER — Encounter

## 2024-08-02 ENCOUNTER — Ambulatory Visit: Attending: Cardiology | Admitting: Cardiology

## 2024-08-02 VITALS — BP 126/65 | HR 58 | Temp 98.1°F | Resp 18 | Ht 62.0 in | Wt 174.8 lb

## 2024-08-02 VITALS — BP 118/72 | HR 56 | Ht 62.0 in | Wt 173.8 lb

## 2024-08-02 DIAGNOSIS — E782 Mixed hyperlipidemia: Secondary | ICD-10-CM | POA: Diagnosis not present

## 2024-08-02 DIAGNOSIS — G4730 Sleep apnea, unspecified: Secondary | ICD-10-CM | POA: Diagnosis not present

## 2024-08-02 DIAGNOSIS — I2510 Atherosclerotic heart disease of native coronary artery without angina pectoris: Secondary | ICD-10-CM | POA: Diagnosis not present

## 2024-08-02 DIAGNOSIS — I10 Essential (primary) hypertension: Secondary | ICD-10-CM | POA: Diagnosis not present

## 2024-08-02 DIAGNOSIS — Z006 Encounter for examination for normal comparison and control in clinical research program: Secondary | ICD-10-CM

## 2024-08-02 MED ORDER — STUDY - ARTEMIS - ZILTIVEKIMAB 15 MG/0.5 ML OR PLACEBO SQ INJECTION (PI-CHRISTOPHER)
15.0000 mg | INJECTION | Freq: Once | SUBCUTANEOUS | Status: AC
  Administered 2024-08-02: 15 mg via SUBCUTANEOUS
  Filled 2024-08-02: qty 0.5

## 2024-08-02 MED ORDER — STUDY - ARTEMIS - ZILTIVEKIMAB 15 MG/0.5 ML OR PLACEBO SQ INJECTION (PI-CHRISTOPHER)
15.0000 mg | INJECTION | SUBCUTANEOUS | 0 refills | Status: AC
Start: 2024-08-02 — End: ?

## 2024-08-02 NOTE — Patient Instructions (Signed)
 Medication Instructions:  Your physician recommends that you continue on your current medications as directed. Please refer to the Current Medication list given to you today.  *If you need a refill on your cardiac medications before your next appointment, please call your pharmacy*  Lab Work: Fasting Lipids, LFTs If you have labs (blood work) drawn today and your tests are completely normal, you will receive your results only by: MyChart Message (if you have MyChart) OR A paper copy in the mail If you have any lab test that is abnormal or we need to change your treatment, we will call you to review the results.  Follow-Up: At [facility], you and your health needs are our priority.  As part of our continuing mission to provide you with exceptional heart care, our providers are all part of one team.  This team includes your primary Cardiologist (physician) and Advanced Practice Providers or APPs (Physician Assistants and Nurse Practitioners) who all work together to provide you with the care you need, when you need it.  Your next appointment:   6 month(s)  Provider:   Lonni Cash, MD

## 2024-08-02 NOTE — Research (Signed)
 ARTEMIS V5 (Month 3 +/-3 days)  Were all eligibility criteria met to continue in study? [x] Yes [] No   Hep B DNA monitoring: [] Yes [x] No    Concomitant meds: [x] Yes [] No    Height, VS: [x] Yes [] No Subject resting for >5 mins before VS taken.    Any hospitalizations/Adverse Events/Infections identified: [x] Yes [] No Describes mild light red rash on both cheeks that started 4 weeks ago on 07/12/2024. [place] No pain/edema/drainage. No treatments.   Experiencing increased fatigue, ongoing since August prior to index MI. Patient endorsing sleeping 9-12 hrs per night and napping during day. Advised to stop nightly benadryl use. No treatments.    Central Lab assessments drawn: [x] Yes [] No   Hs-CRP, Lipids, biochemistry/hematology, PK sampling, immunogenicity assessments    Pregnancy Test Was the sample collected? [] Yes [] No [x] N/A Was the collection date the same as the visit date? [] Yes [] No Specimen Type: [] Serum [] Urine Collection Date: Collection Time: Pregnancy Test Result: [] Positive [] Negative [] Borderline [] Invalid   Administer training of study intervention and dosing instructions and supervised self administration of study intervention during the site visit:[x] Yes [] No  Subject returned pen from kit # [id number] with box. Pen placed in sharps container and box returned to pharmacy. Subject successfully self administered study drug in R anterior thigh at 1223 without complication with pen from kit #[id number] and placed in sharps container. Subject took home 3 pens for home doses from kit # [id number] and [phone]. Instructed to return used pens with boxes to next appt. Pt verbalized understanding.    Study Drug dispensed via [facility]: [x] Yes [] No   Assess dosing and administration conditions: [x] Yes [] No Instructed patient to dose on same day every month to avoid doses less than 28 days apart. DFUs used for reference. Subject forgot dosing diary at this visit. Re  educated to update dosing diary and bring back to every appt. Pt verbalized understanding.    Ensure updated contact person list: [x] Yes [] No    Current Outpatient Medications:    acetaminophen  (TYLENOL ) 500 MG tablet, Take 500-1,000 mg by mouth every 6 (six) hours as needed for moderate pain (pain score 4-6)., Disp: , Rfl:    aspirin  EC 81 MG tablet, Take 1 tablet (81 mg total) by mouth daily. Swallow whole., Disp: 30 tablet, Rfl: 12   diphenhydrAMINE (BENADRYL) 25 mg capsule, Take 25 mg by mouth at bedtime., Disp: , Rfl:    famotidine  (PEPCID ) 20 MG tablet, Take 20 mg by mouth daily., Disp: , Rfl:    lisinopril  (ZESTRIL ) 10 MG tablet, Take 2 tablets (20 mg total) by mouth daily., Disp: 180 tablet, Rfl: 3   nitroGLYCERIN  (NITROSTAT ) 0.4 MG SL tablet, Place 1 tablet (0.4 mg total) under the tongue every 5 (five) minutes as needed for chest pain for up to 3 doses., Disp: 100 tablet, Rfl: 3   rosuvastatin  (CRESTOR ) 10 MG tablet, Take 1 tablet (10 mg total) by mouth daily., Disp: 90 tablet, Rfl: 3   Study - ARTEMIS - ziltivekimab 15 mg/0.5 mL or placebo SQ injection (PI-Christopher), Inject 0.5 mLs (15 mg total) into the skin every 28 (twenty-eight) days. For Investigational Use Only. Bring boxes and pens back to research visits., Disp: 0.5 mL, Rfl: 0   Study - ARTEMIS - ziltivekimab 15 mg/0.5 mL or placebo SQ injection (PI-Christopher), Inject 0.5 mLs (15 mg total) into the skin every 28 (twenty-eight) days., Disp: 1.5 mL, Rfl: 0   ticagrelor  (BRILINTA ) 90 MG TABS tablet, Take 1 tablet (90 mg total) by mouth 2 (  two) times daily., Disp: 180 tablet, Rfl: 3  Current Facility-Administered Medications:    Study - ARTEMIS - ziltivekimab 15 mg/0.5 mL or placebo SQ injection (PI-Christopher), 15 mg, Subcutaneous, Once,

## 2024-08-03 ENCOUNTER — Encounter (HOSPITAL_COMMUNITY)
Admission: RE | Admit: 2024-08-03 | Discharge: 2024-08-03 | Disposition: A | Discharge status: Home / Self Care | Source: Ambulatory Visit | Attending: Cardiology | Admitting: Cardiology

## 2024-08-03 DIAGNOSIS — Z955 Presence of coronary angioplasty implant and graft: Secondary | ICD-10-CM | POA: Diagnosis present

## 2024-08-03 DIAGNOSIS — M542 Cervicalgia: Secondary | ICD-10-CM | POA: Insufficient documentation

## 2024-08-03 DIAGNOSIS — I214 Non-ST elevation (NSTEMI) myocardial infarction: Secondary | ICD-10-CM | POA: Diagnosis present

## 2024-08-04 NOTE — Research (Addendum)
 Are there any labs that are clinically significant?  Yes []  OR No[x]   Is the patient eligible to continue enrollment in the study after screening visit?  Yes [x]   OR No[] 

## 2024-08-05 ENCOUNTER — Encounter (HOSPITAL_COMMUNITY)
Admission: RE | Admit: 2024-08-05 | Discharge: 2024-08-05 | Disposition: A | Discharge status: Home / Self Care | Source: Ambulatory Visit | Attending: Cardiology | Admitting: Cardiology

## 2024-08-05 DIAGNOSIS — I214 Non-ST elevation (NSTEMI) myocardial infarction: Secondary | ICD-10-CM | POA: Diagnosis not present

## 2024-08-05 DIAGNOSIS — Z955 Presence of coronary angioplasty implant and graft: Secondary | ICD-10-CM

## 2024-08-05 DIAGNOSIS — M542 Cervicalgia: Secondary | ICD-10-CM

## 2024-08-06 LAB — HEPATIC FUNCTION PANEL
ALT: 15 IU/L (ref 0–32)
AST: 16 IU/L (ref 0–40)
Albumin: 4.4 g/dL (ref 3.9–4.9)
Alkaline Phosphatase: 103 IU/L (ref 49–135)
Bilirubin Total: 0.4 mg/dL (ref 0.0–1.2)
Bilirubin, Direct: 0.14 mg/dL (ref 0.00–0.40)
Total Protein: 6.9 g/dL (ref 6.0–8.5)

## 2024-08-06 LAB — LIPID PANEL
Chol/HDL Ratio: 2.4 ratio (ref 0.0–4.4)
Cholesterol, Total: 157 mg/dL (ref 100–199)
HDL: 66 mg/dL (ref >39)
LDL Chol Calc (NIH): 71 mg/dL (ref 0–99)
Triglycerides: 113 mg/dL (ref 0–149)
VLDL Cholesterol Cal: 20 mg/dL (ref 5–40)

## 2024-08-08 ENCOUNTER — Ambulatory Visit: Payer: Self-pay | Admitting: Cardiology

## 2024-08-08 ENCOUNTER — Encounter (HOSPITAL_COMMUNITY)
Admission: RE | Admit: 2024-08-08 | Discharge: 2024-08-08 | Disposition: A | Discharge status: Home / Self Care | Source: Ambulatory Visit | Attending: Cardiology | Admitting: Cardiology

## 2024-08-08 DIAGNOSIS — I214 Non-ST elevation (NSTEMI) myocardial infarction: Secondary | ICD-10-CM

## 2024-08-08 DIAGNOSIS — Z955 Presence of coronary angioplasty implant and graft: Secondary | ICD-10-CM

## 2024-08-08 DIAGNOSIS — M542 Cervicalgia: Secondary | ICD-10-CM

## 2024-08-09 NOTE — Progress Notes (Signed)
 Cardiac Individual Treatment Plan  Patient Details  Name: Kathy Stephens MRN: [id number] Date of Birth: 02/07/1961 Referring Provider:   Flowsheet Row INTENSIVE CARDIAC REHAB ORIENT from 06/02/2024 in [facility]  Referring Provider Newman Lawrence, MD    Initial Encounter Date:  Flowsheet Row INTENSIVE CARDIAC REHAB ORIENT from 06/02/2024 in [facility]  Date 06/02/24    Visit Diagnosis: 05/02/24 NSTEMI (non-ST elevated myocardial infarction) (HCC)  05/02/24 DES LAD  Neck pain  Patient's Home Medications on Admission:  Current Outpatient Medications:    acetaminophen  (TYLENOL ) 500 MG tablet, Take 500-1,000 mg by mouth every 6 (six) hours as needed for moderate pain (pain score 4-6)., Disp: , Rfl:    aspirin  EC 81 MG tablet, Take 1 tablet (81 mg total) by mouth daily. Swallow whole., Disp: 30 tablet, Rfl: 12   diphenhydrAMINE (BENADRYL) 25 mg capsule, Take 25 mg by mouth at bedtime., Disp: , Rfl:    famotidine  (PEPCID ) 20 MG tablet, Take 20 mg by mouth daily., Disp: , Rfl:    lisinopril  (ZESTRIL ) 10 MG tablet, Take 2 tablets (20 mg total) by mouth daily., Disp: 180 tablet, Rfl: 3   nitroGLYCERIN  (NITROSTAT ) 0.4 MG SL tablet, Place 1 tablet (0.4 mg total) under the tongue every 5 (five) minutes as needed for chest pain for up to 3 doses., Disp: 100 tablet, Rfl: 3   rosuvastatin  (CRESTOR ) 10 MG tablet, Take 1 tablet (10 mg total) by mouth daily., Disp: 90 tablet, Rfl: 3   Study - ARTEMIS - ziltivekimab 15 mg/0.5 mL or placebo SQ injection (PI-Christopher), Inject 0.5 mLs (15 mg total) into the skin every 28 (twenty-eight) days. For Investigational Use Only. Bring boxes and pens back to research visits., Disp: 0.5 mL, Rfl: 0   Study - ARTEMIS - ziltivekimab 15 mg/0.5 mL or placebo SQ injection (PI-Christopher), Inject 0.5 mLs (15 mg total) into the skin every 28  (twenty-eight) days. (Patient not taking: Reported on 08/02/2024), Disp: 1.5 mL, Rfl: 0   ticagrelor  (BRILINTA ) 90 MG TABS tablet, Take 1 tablet (90 mg total) by mouth 2 (two) times daily., Disp: 180 tablet, Rfl: 3  Past Medical History: 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   TIA (transient ischemic attack) 05/13/2023   Vertebral artery stenosis 05/12/2023    Tobacco Use: Social History   Tobacco Use  Smoking Status Every Day   Types: Cigars   Passive exposure: Current  Smokeless Tobacco Never  Tobacco Comments   5 daily    Labs: Review Flowsheet  More data exists      Latest Ref Rng & Units 03/29/2021 05/12/2023 07/02/2023 05/02/2024 08/05/2024  Labs for ITP Cardiac and Pulmonary Rehab  Cholestrol 100 - 199 mg/dL 740  - 827  779  842   LDL (calc) 0 - 99 mg/dL 830  - 91  860  71   HDL-C >39 mg/dL 56  - 55  58  66   Trlycerides 0 - 149 mg/dL 814  - 849  885  886   Hemoglobin A1c 4.8 - 5.6 % - - 5.6  - -  TCO2 22 - 32 mmol/L - 26  - - -     Exercise Target Goals: Exercise Program Goal: Individual exercise prescription set using results from initial 6 min walk test and THRR while considering  patient's activity barriers and safety.   Exercise Prescription Goal: Initial exercise prescription builds to 30-45 minutes a day of aerobic activity, 2-3 days per week.  Home exercise guidelines will be given to  patient during program as part of exercise prescription that the participant will acknowledge.   Education: Aerobic Exercise: - Group verbal and visual presentation on the components of exercise prescription. Introduces F.I.T.T principle from ACSM for exercise prescriptions.  Reviews F.I.T.T. principles of aerobic exercise including progression. Written material provided at class time.   Education: Resistance Exercise: - Group verbal and visual presentation on the components of exercise prescription. Introduces F.I.T.T principle from ACSM for exercise prescriptions  Reviews F.I.T.T. principles of resistance exercise including progression. Written material provided at class time.    Education: Exercise & Equipment Safety: - Individual verbal instruction and demonstration of equipment use and safety with use of the equipment.   Education: Exercise Physiology & General Exercise Guidelines: - Group verbal and written instruction with models to review the exercise physiology of the cardiovascular system and associated critical values. Provides general exercise guidelines with specific guidelines to those with heart or lung disease. Written material provided at class time.   Education: Flexibility, Balance, Mind/Body Relaxation: - Group verbal and visual presentation with interactive activity on the components of exercise prescription. Introduces F.I.T.T principle from ACSM for exercise prescriptions. Reviews F.I.T.T. principles of flexibility and balance exercise training including progression. Also discusses the mind body connection.  Reviews various relaxation techniques to help reduce and manage stress (i.e. Deep breathing, progressive muscle relaxation, and visualization). Balance handout provided to take home. Written material provided at class time.   Activity Barriers & Risk Stratification:  Activity Barriers & Cardiac Risk Stratification - 06/02/24 1429       Activity Barriers & Cardiac  Risk Stratification   Activity Barriers Arthritis;Balance Concerns;Shortness of Breath;Deconditioning;Assistive Device    Cardiac Risk Stratification High   <5 METs on         6 Minute Walk:  6 Minute Walk     Row Name 06/02/24 1531         6 Minute Walk   Phase Initial     Distance 1245 feet     Walk Time 6 minutes     # of Rest Breaks 0     MPH 2.36     METS 3.14     RPE 12     Perceived Dyspnea  1     VO2 Peak 10.98     Symptoms Yes (comment)     Comments 6/10 L chronic calf pain. SOB. Both resolved with rest     Resting HR 61 bpm     Resting BP 116/72     Resting Oxygen Saturation  98 %     Exercise Oxygen Saturation  during 6 min walk 99 %     Max Ex. HR 99 bpm     Max Ex. BP 152/68     2 Minute Post BP 124/64  Oxygen Initial Assessment:   Oxygen Re-Evaluation:   Oxygen Discharge (Final Oxygen Re-Evaluation):   Initial Exercise Prescription:  Initial Exercise Prescription - 06/02/24 1500       Date of Initial Exercise RX and Referring Provider   Date 06/02/24    Referring Provider Newman Lawrence, MD    Expected Discharge Date 08/24/24      NuStep   Level 1    SPM 70    Minutes 15    METs 2      Prescription Details   Frequency (times per week) 3    Duration Progress to 30 minutes of continuous aerobic without signs/symptoms of physical distress      Intensity   THRR 40-80% of Max Heartrate 58-115    Ratings of Perceived Exertion 11-13    Perceived Dyspnea 0-4      Progression   Progression Continue progressive overload as per policy without signs/symptoms or physical distress.      Resistance Training   Training Prescription Yes    Weight 2    Reps 10-15          Perform Capillary Blood Glucose checks as needed.  Exercise Prescription Changes:   Exercise Prescription Changes     Row Name 06/08/24 1021 06/20/24 1034 07/06/24 1027 07/25/24 1029 08/08/24 1024     Response to Exercise   Blood Pressure (Admit)  114/66 106/72 114/62 114/70 136/80   Blood Pressure (Exercise) 150/62 148/80 132/66 130/82 --   Blood Pressure (Exit) 100/58 106/72 108/62 108/64 112/78   Heart Rate (Admit) 67 bpm 65 bpm 69 bpm 64 bpm 57 bpm   Heart Rate (Exercise) 92 bpm 107 bpm 95 bpm 114 bpm 93 bpm   Heart Rate (Exit) 62 bpm 70 bpm 61 bpm 72 bpm 55 bpm   Rating of Perceived Exertion (Exercise) 12 12 9 11 12    Symptoms None None None None None   Comments Off to a good start with exercise. -- -- Reviewed home exercise guidelines and goals with Kathy Stephens. Reviewed METs and goals with Kathy Stephens.   Duration Progress to 30 minutes of  aerobic without signs/symptoms of physical distress Progress to 30 minutes of  aerobic without signs/symptoms of physical distress Progress to 30 minutes of  aerobic without signs/symptoms of physical distress Progress to 30 minutes of  aerobic without signs/symptoms of physical distress Progress to 30 minutes of  aerobic without signs/symptoms of physical distress   Intensity THRR unchanged THRR unchanged THRR unchanged THRR unchanged THRR unchanged     Progression   Progression Continue to progress workloads to maintain intensity without signs/symptoms of physical distress. Continue to progress workloads to maintain intensity without signs/symptoms of physical distress. Continue to progress workloads to maintain intensity without signs/symptoms of physical distress. Continue to progress workloads to maintain intensity without signs/symptoms of physical distress. Continue to progress workloads to maintain intensity without signs/symptoms of physical distress.   Average METs 2.6 2.7 2.8 2.7 2.1     Resistance Training   Training Prescription No Yes No Yes Yes   Weight Relaxation day, no weights. 2 lb wts Relaxation day, no weights. 2 lbs 2 lbs   Reps -- 10-15 -- 10-15 10-15   Time -- 5 Minutes -- 5 Minutes 5 Minutes     Interval Training   Interval Training No No No No No     NuStep   Level 1 1 1 1 2     SPM 91 110 108 113 104   Minutes 27  25 25 31 30    METs 2.6 2.7 2.8 2.7 2.1     Home Exercise Plan   Plans to continue exercise at -- -- -- Home (comment)  Walking Home (comment)  Walking   Frequency -- -- -- Add 4 additional days to program exercise sessions. Add 4 additional days to program exercise sessions.   Initial Home Exercises Provided -- -- -- 07/25/24 07/25/24      Exercise Comments:   Exercise Comments     Row Name 06/08/24 1121 07/06/24 1134 07/25/24 1109 08/08/24 1112     Exercise Comments Kathy Stephens tolerated low intensity exercise well without symptoms. Oriented her to the exercise equipment and stretching routine. Kathy Stephens returned to exercise after recent hospitalization and tolerated low intensity exercise without symptoms. Reviewed home exercise and goals with Kathy Stephens. Reviewed METs and goals with Kathy Stephens.       Exercise Goals and Review:   Exercise Goals     Row Name 06/02/24 1429             Exercise Goals   Increase Physical Activity Yes       Intervention Provide advice, education, support and counseling about physical activity/exercise needs.;Develop an individualized exercise prescription for aerobic and resistive training based on initial evaluation findings, risk stratification, comorbidities and participant's personal goals.       Expected Outcomes Short Term: Attend rehab on a regular basis to increase amount of physical activity.;Long Term: Exercising regularly at least 3-5 days a week.;Long Term: Add in home exercise to make exercise part of routine and to increase amount of physical activity.       Increase Strength and Stamina Yes       Intervention Provide advice, education, support and counseling about physical activity/exercise needs.;Develop an individualized exercise prescription for aerobic and resistive training based on initial evaluation findings, risk stratification, comorbidities and participant's personal goals.       Expected Outcomes  Short Term: Increase workloads from initial exercise prescription for resistance, speed, and METs.;Short Term: Perform resistance training exercises routinely during rehab and add in resistance training at home;Long Term: Improve cardiorespiratory fitness, muscular endurance and strength as measured by increased METs and functional capacity ( )       Able to understand and use rate of perceived exertion (RPE) scale Yes       Intervention Provide education and explanation on how to use RPE scale       Expected Outcomes Short Term: Able to use RPE daily in rehab to express subjective intensity level;Long Term:  Able to use RPE to guide intensity level when exercising independently       Knowledge and understanding of Target Heart Rate Range (THRR) Yes       Intervention Provide education and explanation of THRR including how the numbers were predicted and where they are located for reference       Expected Outcomes Short Term: Able to state/look up THRR;Long Term: Able to use THRR to govern intensity when exercising independently;Short Term: Able to use daily as guideline for intensity in rehab       Understanding of Exercise Prescription Yes       Intervention Provide education, explanation, and written materials on patient's individual exercise prescription       Expected Outcomes Short Term: Able to explain program exercise prescription;Long Term: Able to explain home exercise prescription to exercise independently          Exercise Goals Re-Evaluation :  Exercise Goals Re-Evaluation  Row Name 06/08/24 1021 07/06/24 1134 07/25/24 1109 08/08/24 1112       Exercise Goal Re-Evaluation   Exercise Goals Review Increase Physical Activity;Increase Strength and Stamina;Able to understand and use rate of perceived exertion (RPE) scale Increase Physical Activity;Increase Strength and Stamina;Able to understand and use rate of perceived exertion (RPE) scale Increase Physical Activity;Increase  Strength and Stamina;Able to understand and use rate of perceived exertion (RPE) scale Increase Physical Activity;Increase Strength and Stamina;Able to understand and use rate of perceived exertion (RPE) scale    Comments Kathy Stephens was able to understand and use RPE scale appropriately. She is awaiting hip surgery. She tolerated the recumbent stepper for 27 minutes without issue. Lesli returned to exercise today post-hospitialization and tolerated low intensity exercise well. Kathy Stephens will be out of town the next 2 weeks. Kathy Stephens returned to [place] today after 2-week vacation. She walked 0.5 mile daily while she was away. She is currently walking 10-15 minutes 4-5 times/day for 45 minutes walking her dogs. Exercise is limited by right hip pain, which is bone on bone. She had been scheduled for hip replacement prior to her cardiac event. She previously exercised at [facility] center participating in yoga and other exercise. She will look into going back to [place] and doing chair yoga for balance. She has a yoga mat at home and can look into chair yoga on Youtube. her goal is to lose weight. We discussed increasing duration to help with weight loss goal. Kathy Stephens states she feels better since starting the program. She and her husband have rejoined the [facility] and they have the recumbent stepper that she uses here and a circuit of weights that she plans to use for resistance exercises. She is limited by hip and is in need of a repalcement.    Expected Outcomes Progress workloads as tolerated. Will review goals upon return to cardiac rehab. Kathy Stephens will continue daily exericse. She will look into longer bouts to help achieve weight loss goals. Kathy Stephens will resume exercise at the [facility].       Discharge Exercise Prescription (Final Exercise Prescription Changes):  Exercise Prescription Changes - 08/08/24 1024       Response to Exercise   Blood Pressure (Admit) 136/80    Blood Pressure (Exit)  112/78    Heart Rate (Admit) 57 bpm    Heart Rate (Exercise) 93 bpm    Heart Rate (Exit) 55 bpm    Rating of Perceived Exertion (Exercise) 12    Symptoms None    Comments Reviewed METs and goals with Kathy Stephens.    Duration Progress to 30 minutes of  aerobic without signs/symptoms of physical distress    Intensity THRR unchanged      Progression   Progression Continue to progress workloads to maintain intensity without signs/symptoms of physical distress.    Average METs 2.1      Resistance Training   Training Prescription Yes    Weight 2 lbs    Reps 10-15    Time 5 Minutes      Interval Training   Interval Training No      NuStep   Level 2    SPM 104    Minutes 30    METs 2.1      Home Exercise Plan   Plans to continue exercise at Home (comment)   Walking   Frequency Add 4 additional days to program exercise sessions.    Initial Home Exercises Provided 07/25/24  Nutrition:  Target Goals: Understanding of nutrition guidelines, daily intake of sodium 1500mg , cholesterol 200mg , calories 30% from fat and 7% or less from saturated fats, daily to have 5 or more servings of fruits and vegetables.  Education: Nutrition 1 -Group instruction provided by verbal, written material, interactive activities, discussions, models, and posters to present general guidelines for heart healthy nutrition including macronutrients, label reading, and promoting whole foods over processed counterparts. Education serves as Pensions consultant of discussion of heart healthy eating for all. Written material provided at class time.    Education: Nutrition 2 -Group instruction provided by verbal, written material, interactive activities, discussions, models, and posters to present general guidelines for heart healthy nutrition including sodium, cholesterol, and saturated fat. Providing guidance of habit forming to improve blood pressure, cholesterol, and body weight. Written material provided at class  time.     Biometrics:  Pre Biometrics - 06/02/24 1428       Pre Biometrics   Waist Circumference 39.5 inches    Hip Circumference 47 inches    Waist to Hip Ratio 0.84 %    Triceps Skinfold 32 mm    % Body Fat 43.3 %    Grip Strength 10 kg    Flexibility --   not done- hip issues   Single Leg Stand 16.56 seconds           Nutrition Therapy Plan and Nutrition Goals:  Nutrition Therapy & Goals - 06/08/24 1112       Nutrition Therapy   Diet Heart Healthy Diet    Drug/Food Interactions Statins/Certain Fruits      Personal Nutrition Goals   Nutrition Goal Patient to identify strategies for reducing cardiovascular risk by attending the [employer] education and nutrition series weekly.    Personal Goal #2 Patient to improve diet quality by using the plate method as a guide for meal planning to include lean protein/plant protein, fruits, vegetables, whole grains, nonfat dairy as part of a well-balanced diet.    Comments Patient has medical history of HTN, HLD, tobacco use, CAD, history of multiple TIAs, splenic infarct, NSTEMI. LDL is not at goal; she continues crestor . Patient will benefit from participation in intensive cardiac rehab for nutrition education, exercise, and lifestyle modification.      Intervention Plan   Intervention Prescribe, educate and counsel regarding individualized specific dietary modifications aiming towards targeted core components such as weight, hypertension, lipid management, diabetes, heart failure and other comorbidities.;Nutrition handout(s) given to patient.    Expected Outcomes Short Term Goal: Understand basic principles of dietary content, such as calories, fat, sodium, cholesterol and nutrients.;Long Term Goal: Adherence to prescribed nutrition plan.          Nutrition Assessments:  Nutrition Assessments - 06/10/24 1339       Rate Your Plate Scores   Pre Score 49         MEDIFICTS Score Key: >=70 Need to make dietary changes  40-70  Heart Healthy Diet <= 40 Therapeutic Level Cholesterol Diet  Flowsheet Row INTENSIVE CARDIAC REHAB from 06/10/2024 in [facility]  Picture Your Plate Total Score on Admission 49   Picture Your Plate Scores: <59 Unhealthy dietary pattern with much room for improvement. 41-50 Dietary pattern unlikely to meet recommendations for good health and room for improvement. 51-60 More healthful dietary pattern, with some room for improvement.  >60 Healthy dietary pattern, although there may be some specific behaviors that could be improved.  Nutrition Goals Re-Evaluation:  Nutrition Goals Re-Evaluation     Row Name 06/08/24 1112             Goals   Current Weight 173 lb 1 oz (78.5 kg)       Comment choleserol 220, LDL 139, HDL 58       Expected Outcome Patient has medical history of HTN, HLD, tobacco use, CAD, history of multiple TIAs, splenic infarct, NSTEMI. LDL is not at goal; she continues crestor . Patient will benefit from participation in intensive cardiac rehab for nutrition education, exercise, and lifestyle modification.          Nutrition Goals Discharge (Final Nutrition Goals Re-Evaluation):  Nutrition Goals Re-Evaluation - 06/08/24 1112       Goals   Current Weight 173 lb 1 oz (78.5 kg)    Comment choleserol 220, LDL 139, HDL 58    Expected Outcome Patient has medical history of HTN, HLD, tobacco use, CAD, history of multiple TIAs, splenic infarct, NSTEMI. LDL is not at goal; she continues crestor . Patient will benefit from participation in intensive cardiac rehab for nutrition education, exercise, and lifestyle modification.          Psychosocial: Target Goals: Acknowledge presence or absence of significant depression and/or stress, maximize coping skills, provide positive support system. Participant is able to verbalize types and ability to use techniques and skills needed for reducing stress and depression.    Education: Stress, Anxiety, and Depression - Group verbal and visual presentation to define topics covered.  Reviews how body is impacted by stress, anxiety, and depression.  Also discusses healthy ways to reduce stress and to treat/manage anxiety and depression. Written material provided at class time.   Education: Sleep Hygiene -Provides group verbal and written instruction about how sleep can affect your health.  Define sleep hygiene, discuss sleep cycles and impact of sleep habits. Review good sleep hygiene tips.   Initial Review & Psychosocial Screening:  Initial Psych Review & Screening - 06/02/24 1430       Initial Review   Current issues with Current Anxiety/Panic      Family Dynamics   Good Support System? Yes   husband/friends   Comments Taleah shared that she has had some anxiety since her MI. She shared that she has fear that something else will happen and that on a recent flight to see family she experienced 2 panic attacks. She denies any depression with this. Support offered, Tessica shared that she does not need any additional support or resources at this time. She has friends and family who are helping her.      Barriers   Psychosocial barriers to participate in program The patient should benefit from training in stress management and relaxation.;Psychosocial barriers identified (see note)      Screening Interventions   Interventions Provide feedback about the scores to participant;To provide support and resources with identified psychosocial needs;Encouraged to exercise    Expected Outcomes Long Term goal: The participant improves quality of Life and PHQ9 Scores as seen by post scores and/or verbalization of changes;Short Term goal: Identification and review with participant of any Quality of Life or Depression concerns found by scoring the questionnaire.;Long Term Goal: Stressors or current issues are controlled or eliminated.          Quality of Life Scores:    Quality of Life - 06/02/24 1534       Quality of Life   Select Quality of Life      Quality  of Life Scores   Health/Function Pre 17.17 %    Socioeconomic Pre 24.07 %    Psych/Spiritual Pre 19.92 %    Family Pre 24.4 %    GLOBAL Pre 20.23 %         Scores of 19 and below usually indicate a poorer quality of life in these areas.  A difference of  2-3 points is a clinically meaningful difference.  A difference of 2-3 points in the total score of the Quality of Life Index has been associated with significant improvement in overall quality of life, self-image, physical symptoms, and general health in studies assessing change in quality of life.  PHQ-9: Review Flowsheet  More data exists      06/02/2024 05/09/2024 03/21/2024 10/13/2023 09/08/2023  Depression screen PHQ 2/9  Decreased Interest 0 0 0 0 0  Down, Depressed, Hopeless 0 0 0 0 0  PHQ - 2 Score 0 0 0 0 0  Altered sleeping 2 1 0 0 0  Tired, decreased energy 1 1 0 0 0  Change in appetite 0 0 0 0 0  Feeling bad or failure about yourself  0 0 0 0 0  Trouble concentrating 0 1 0 0 0  Moving slowly or fidgety/restless 0 0 0 0 0  Suicidal thoughts 0 0 0 0 0  PHQ-9 Score 3 3 0 0 0  Difficult doing work/chores Not difficult at all - - Not difficult at all Not difficult at all   Interpretation of Total Score  Total Score Depression Severity:  1-4 = Minimal depression, 5-9 = Mild depression, 10-14 = Moderate depression, 15-19 = Moderately severe depression, 20-27 = Severe depression   Psychosocial Evaluation and Intervention:   Psychosocial Re-Evaluation:  Psychosocial Re-Evaluation     Row Name 06/08/24 1337 07/12/24 1212 08/09/24 9072         Psychosocial Re-Evaluation   Current issues with Current Anxiety/Panic;Current Stress Concerns Current Anxiety/Panic;Current Stress Concerns Current Anxiety/Panic;Current Stress Concerns     Comments Kathy Stephens did mention that she was supposed to have hip surgery before she had her MI. Will  review quality of life in the upcoming week Quality of life and PHQ9 reviewed. Kathy Stephens she has experienced anxiety regarding her recent MI and stenting. Brinklee denies being depressed.  Kathy Stephens has fibromyalgia and thinks she was slow to recognize anginal symptoms due to this. Kathy Stephens says she has had low energy post MI. Kathy Stephens became tearful as we talked. Offered emotional support and reassurance. Laveah may benefit from counseling. Keymiah said she will think about proceeding with counseling and let us  know if she wants a referral placed. Chanie has not voiced any increased concerns or stressors during exercise at cardiac rehab since she has been recathed.     Expected Outcomes Kathy Stephens will have controlled or decreased stressors/ anxiety upon compleiton of cardiac rehab Kathy Stephens will have controlled or decreased stressors/ anxiety upon compleiton of cardiac rehab Kathy Stephens will have controlled or decreased stressors/ anxiety upon compleiton of cardiac rehab     Interventions Stress management education;Relaxation education;Encouraged to attend Cardiac Rehabilitation for the exercise Stress management education;Relaxation education;Encouraged to attend Cardiac Rehabilitation for the exercise Stress management education;Relaxation education;Encouraged to attend Cardiac Rehabilitation for the exercise     Continue Psychosocial Services  Follow up required by staff Follow up required by staff Follow up required by staff       Initial Review   Source of Stress Concerns Chronic Illness Chronic Illness Chronic Illness  Comments will continue to monitor and offer support as needed. will continue to monitor and offer support as needed. will continue to monitor and offer support as needed.        Psychosocial Discharge (Final Psychosocial Re-Evaluation):  Psychosocial Re-Evaluation - 08/09/24 0927       Psychosocial Re-Evaluation   Current issues with Current Anxiety/Panic;Current Stress Concerns    Comments Kathy Stephens  has not voiced any increased concerns or stressors during exercise at cardiac rehab since she has been recathed.    Expected Outcomes Kathy Stephens will have controlled or decreased stressors/ anxiety upon compleiton of cardiac rehab    Interventions Stress management education;Relaxation education;Encouraged to attend Cardiac Rehabilitation for the exercise    Continue Psychosocial Services  Follow up required by staff      Initial Review   Source of Stress Concerns Chronic Illness    Comments will continue to monitor and offer support as needed.          Vocational Rehabilitation: Provide vocational rehab assistance to qualifying candidates.   Vocational Rehab Evaluation & Intervention:  Vocational Rehab - 06/02/24 1535       Initial Vocational Rehab Evaluation & Intervention   Assessment shows need for Vocational Rehabilitation No   retired         Education: Education Goals: Education classes will be provided on a variety of topics geared toward better understanding of heart health and risk factor modification. Participant will state understanding/return demonstration of topics presented as noted by education test scores.  Learning Barriers/Preferences:  Learning Barriers/Preferences - 06/02/24 1534       Learning Barriers/Preferences   Learning Barriers Sight    Learning Preferences Audio;Computer/Internet;Group Instruction;Individual Instruction;Skilled Demonstration;Verbal Instruction;Video;Written Material;Pictoral          General Cardiac Education Topics:  AED/CPR: - Group verbal and written instruction with the use of models to demonstrate the basic use of the AED with the basic ABC's of resuscitation.   Test and Procedures: - Group verbal and visual presentation and models provide information about basic cardiac anatomy and function. Reviews the testing methods done to diagnose heart disease and the outcomes of the test results. Describes the treatment choices:  Medical Management, Angioplasty, or Coronary Bypass Surgery for treating various heart conditions including Myocardial Infarction, Angina, Valve Disease, and Cardiac Arrhythmias. Written material provided at class time.   Medication Safety: - Group verbal and visual instruction to review commonly prescribed medications for heart and lung disease. Reviews the medication, class of the drug, and side effects. Includes the steps to properly store meds and maintain the prescription regimen. Written material provided at class time.   Intimacy: - Group verbal instruction through game format to discuss how heart and lung disease can affect sexual intimacy. Written material provided at class time.   Know Your Numbers and Heart Failure: - Group verbal and visual instruction to discuss disease risk factors for cardiac and pulmonary disease and treatment options.  Reviews associated critical values for Overweight/Obesity, Hypertension, Cholesterol, and Diabetes.  Discusses basics of heart failure: signs/symptoms and treatments.  Introduces Heart Failure Zone chart for action plan for heart failure. Written material provided at class time.   Infection Prevention: - Provides verbal and written material to individual with discussion of infection control including proper hand washing and proper equipment cleaning during exercise session.   Falls Prevention: - Provides verbal and written material to individual with discussion of falls prevention and safety.   Other: -Provides group and verbal instruction on various  topics (see comments)   Knowledge Questionnaire Score:  Knowledge Questionnaire Score - 06/02/24 1535       Knowledge Questionnaire Score   Pre Score 23/24          Core Components/Risk Factors/Patient Goals at Admission:  Personal Goals and Risk Factors at Admission - 06/02/24 1535       Core Components/Risk Factors/Patient Goals on Admission    Weight Management  Yes;Obesity;Weight Loss    Intervention Weight Management: Develop a combined nutrition and exercise program designed to reach desired caloric intake, while maintaining appropriate intake of nutrient and fiber, sodium and fats, and appropriate energy expenditure required for the weight goal.;Weight Management: Provide education and appropriate resources to help participant work on and attain dietary goals.;Weight Management/Obesity: Establish reasonable short term and long term weight goals.;Obesity: Provide education and appropriate resources to help participant work on and attain dietary goals.    Goal Weight: Long Term 168 lb (76.2 kg)   pt goal   Hypertension Yes    Intervention Provide education on lifestyle modifcations including regular physical activity/exercise, weight management, moderate sodium restriction and increased consumption of fresh fruit, vegetables, and low fat dairy, alcohol moderation, and smoking cessation.;Monitor prescription use compliance.    Expected Outcomes Short Term: Continued assessment and intervention until BP is < 140/36mm HG in hypertensive participants. < 130/30mm HG in hypertensive participants with diabetes, heart failure or chronic kidney disease.;Long Term: Maintenance of blood pressure at goal levels.    Lipids Yes    Intervention Provide education and support for participant on nutrition & aerobic/resistive exercise along with prescribed medications to achieve LDL 70mg , HDL >40mg .    Expected Outcomes Short Term: Participant states understanding of desired cholesterol values and is compliant with medications prescribed. Participant is following exercise prescription and nutrition guidelines.;Long Term: Cholesterol controlled with medications as prescribed, with individualized exercise RX and with personalized nutrition plan. Value goals: LDL < 70mg , HDL > 40 mg.    Stress Yes    Intervention Refer participants experiencing significant psychosocial distress to  appropriate mental health specialists for further evaluation and treatment. When possible, include family members and significant others in education/counseling sessions.;Offer individual and/or small group education and counseling on adjustment to heart disease, stress management and health-related lifestyle change. Teach and support self-help strategies.    Expected Outcomes Short Term: Participant demonstrates changes in health-related behavior, relaxation and other stress management skills, ability to obtain effective social support, and compliance with psychotropic medications if prescribed.;Long Term: Emotional wellbeing is indicated by absence of clinically significant psychosocial distress or social isolation.          Education:Diabetes - Individual verbal and written instruction to review signs/symptoms of diabetes, desired ranges of glucose level fasting, after meals and with exercise. Acknowledge that pre and post exercise glucose checks will be done for 3 sessions at entry of program.   Core Components/Risk Factors/Patient Goals Review:   Goals and Risk Factor Review     Row Name 06/08/24 1409 07/12/24 1213 08/09/24 0929         Core Components/Risk Factors/Patient Goals Review   Personal Goals Review Weight Management/Obesity;Hypertension;Lipids;Stress Weight Management/Obesity;Hypertension;Lipids;Stress Weight Management/Obesity;Hypertension;Lipids;Stress     Review Kathy Stephens started cardiac rehab on 06/08/24. Kathy Stephens did well with exercise. Vital signs were stable. Kathy Stephens returned to exercise at  cardiac rehab on 07/06/24 Vital signs have been stable. Kathy Stephens has been doing well with exercise at  cardiac rehab  Vital signs have been stable.     Expected Outcomes Kathy Stephens  will continue to participate in cardiac rehab for exercise, nutrition and lifestyle modificaitons [place] will continue to participate in cardiac rehab for exercise, nutrition and lifestyle modificaitons Kathy Stephens will  continue to participate in cardiac rehab for exercise, nutrition and lifestyle modificaitons        Core Components/Risk Factors/Patient Goals at Discharge (Final Review):   Goals and Risk Factor Review - 08/09/24 0929       Core Components/Risk Factors/Patient Goals Review   Personal Goals Review Weight Management/Obesity;Hypertension;Lipids;Stress    Review Kathy Stephens has been doing well with exercise at  cardiac rehab  Vital signs have been stable.    Expected Outcomes Kathy Stephens will continue to participate in cardiac rehab for exercise, nutrition and lifestyle modificaitons          ITP Comments:  ITP Comments     Row Name 06/02/24 1312 06/08/24 1336 07/12/24 1210 08/09/24 0925     ITP Comments Dr. Wilbert Bihari medical director. Introduction to pritikin education/intensive cardiac rehab. Initial orientation packet reviewed with patient. 30 Day ITP Review. Merari started cardiac rehab on 06/08/24. Naiya did well with exercise. 30 Day ITP Review. Kirah returned to exercise at cardiac rehab on 07/06/24. Presleigh did well with exercise. Soft exsisting  radial hematoma present from cath 30 Day ITP Review. Diva has good attendance and participation with exercise at cardiac rehab       Comments: See ITP Comments

## 2024-08-10 ENCOUNTER — Encounter (HOSPITAL_COMMUNITY)
Admission: RE | Admit: 2024-08-10 | Discharge: 2024-08-10 | Disposition: A | Discharge status: Home / Self Care | Source: Ambulatory Visit | Attending: Cardiology | Admitting: Cardiology

## 2024-08-10 DIAGNOSIS — I214 Non-ST elevation (NSTEMI) myocardial infarction: Secondary | ICD-10-CM | POA: Diagnosis not present

## 2024-08-10 DIAGNOSIS — Z955 Presence of coronary angioplasty implant and graft: Secondary | ICD-10-CM

## 2024-08-10 NOTE — Progress Notes (Signed)
 Reviewed home exercise guidelines with Kathy Stephens including endpoints, temperature precautions, target heart rate and rate of perceived exertion. She is walking and doing chair yoga at home as her mode of home exercise.  Kathy Stephens has rejoined the [facility] and will exercise there as well as at home. She has a smart watch to monitor her pulse. Kathy Stephens voices understanding of instructions given.  Kathy CHRISTELLA Gal, MS, ACSM CEP

## 2024-08-12 ENCOUNTER — Encounter (HOSPITAL_COMMUNITY)
Admission: RE | Admit: 2024-08-12 | Discharge: 2024-08-12 | Disposition: A | Discharge status: Home / Self Care | Source: Ambulatory Visit | Attending: Cardiology | Admitting: Cardiology

## 2024-08-12 DIAGNOSIS — Z955 Presence of coronary angioplasty implant and graft: Secondary | ICD-10-CM

## 2024-08-12 DIAGNOSIS — I214 Non-ST elevation (NSTEMI) myocardial infarction: Secondary | ICD-10-CM | POA: Diagnosis not present

## 2024-08-12 DIAGNOSIS — M542 Cervicalgia: Secondary | ICD-10-CM

## 2024-08-15 ENCOUNTER — Encounter (HOSPITAL_COMMUNITY)
Admission: RE | Admit: 2024-08-15 | Discharge: 2024-08-15 | Disposition: A | Source: Ambulatory Visit | Attending: Cardiology

## 2024-08-15 DIAGNOSIS — I214 Non-ST elevation (NSTEMI) myocardial infarction: Secondary | ICD-10-CM | POA: Diagnosis not present

## 2024-08-15 DIAGNOSIS — Z955 Presence of coronary angioplasty implant and graft: Secondary | ICD-10-CM

## 2024-08-16 ENCOUNTER — Ambulatory Visit: Admitting: Family Medicine

## 2024-08-16 ENCOUNTER — Encounter: Payer: Self-pay | Admitting: Family Medicine

## 2024-08-16 VITALS — BP 134/74 | HR 59 | Ht 62.0 in | Wt 175.0 lb

## 2024-08-16 DIAGNOSIS — Z6832 Body mass index (BMI) 32.0-32.9, adult: Secondary | ICD-10-CM

## 2024-08-16 DIAGNOSIS — R5383 Other fatigue: Secondary | ICD-10-CM | POA: Diagnosis not present

## 2024-08-16 DIAGNOSIS — E782 Mixed hyperlipidemia: Secondary | ICD-10-CM

## 2024-08-16 NOTE — Progress Notes (Signed)
 Established Patient Office Visit  Subjective   Patient ID: Kathy Stephens, female    DOB: 1960/11/16  Age: 63 y.o. MRN: [id number]  Chief Complaint  Patient presents with   Medical Management of Chronic Issues    HPI   Subjective - Follow-up to discuss issues since last visit 3 months ago. Main concerns are side effects from Crestor , significant fatigue, and weight gain.  - Reports significant body aches, described as joint pain rather than muscle aches, since starting Crestor . The pain is intermittent but can be severe enough to make walking difficult. Discussed with cardiology, who suggested switching to Repatha and consulting with primary care. LDL is at goal (71), but symptoms are bothersolic.  - Reports significant fatigue, sleeping 9-12 hours per night plus napping during the day, which is unusual. Reports poor quality sleep and daytime somnolence, falling asleep while watching TV. Apple Watch indicates a poor respiratory rate during sleep. Husband notes light snoring but has not witnessed any apneic episodes.  - Reports weight gain despite efforts to follow a Pritikin diet recommended by cardiac rehab. Weight was 168 lbs prior to heart attack, has been as high as 176 lbs. Was 173 lbs on Monday and 170.5 lbs today. Following diet has been difficult due to its restrictive nature (no salt, no dairy, no bread, no red meat). Trying to incorporate American Heart Association recipes and increase vegetable intake.  - Continues with cardiac rehab, graduating on 08/26/2024. Currently does 30 minutes on a recumbent bike due to hip issues. Plans to continue using the same machine at a senior center after graduation. Also started chair yoga on off-days.  - Hip replacement with Dr. Eda was postponed post-heart attack. Plan is to reconsider surgery one year out and after stopping Brilinta  (after June). Received a steroid injection in the hip which provided relief for about two months. Continues  to have hip pain.  Medications Crestor , reports significant body aches and joint pain. Brilinta , to be continued until at least June. Isosorbide  (Imdur ) was taken for 30 days post-hospitalization and has been discontinued. Started taking B12. Stopped taking Vitamin D  at night.  PMH, PSH, FH, Social Hx PMHx: Coronary artery disease s/p heart attack, hyperlipidemia, hip arthritis. Hospitalized for chest pain post-MI, workup was normal. PSH: Angiogram. Social Hx: Enrolled in a cardiac research study . Attending cardiac rehab.  ROS Constitutional: Positive for fatigue, daytime somnolence, and weight gain. MSK: Positive for diffuse body aches and joint pain, and chronic hip pain. Denies muscle aches. Neurological: Denies morning headaches. Respiratory: Reports snoring. Denies witnessed apnea.     The ASCVD Risk score (Arnett DK, et al., 2019) failed to calculate for the following reasons:   Risk score cannot be calculated because patient has a medical history suggesting prior/existing ASCVD  Health Maintenance Due  Topic Date Due   Hepatitis C Screening  Never done   COVID-19 Vaccine (1 - 2025-26 season) Never done      Objective:     BP 134/74   Pulse (!) 59   Ht 5' 2 (1.575 m)   Wt 175 lb (79.4 kg)   LMP 11/03/2006   SpO2 99%   BMI 32.01 kg/m    Physical Exam Gen: alert, oriented Pulm: no respiratory distress Psych: pleasant affect   No results found for any visits on 08/16/24.      Assessment & Plan:   Mixed hyperlipidemia Assessment & Plan: Patient has LDL at 71 on Crestor  but is experiencing significant, intolerable joint  pain. Cardiologist suggested a switch to Repatha. Patient is enrolled in a research trial, and it is unclear if changing medication is permissible. - Patient to contact research trial nurse (Dr. Eston office) to confirm if switching from Crestor  to Repatha is allowed. - If approved, will prescribe Repatha. Plan would be to switch  from Crestor  to Repatha and monitor lipid panel. May consider adding back low-dose Crestor  if needed to reach goal and if tolerated. - Patient will send a message to confirm.  Orders: -     Comprehensive metabolic panel with GFR; Future -     Lipid panel; Future -     Hemoglobin A1c; Future  Other fatigue Assessment & Plan: Patient reports excessive daytime somnolence, sleeping 9-12 hours per night plus daytime naps, and poor quality sleep. Husband confirms light snoring. Apple Watch data suggests poor respiratory rate. Thyroid and basic labs done by cardiology were normal. Ferritin has not been checked. The presentation is highly suspicious for obstructive sleep apnea. - Order in-lab sleep study (polysomnography). - Discussed rationale for study is to confirm or rule out sleep apnea as the cause of fatigue, even if patient is hesitant about CPAP therapy. - Discussed alternative treatments for sleep apnea if diagnosed, including oral appliances (mouth guards), Inspire device, and weight loss (e.g., via GLP-1 agonists).  Orders: -     CBC with Differential/Platelet; Future -     Iron, TIBC and Ferritin Panel; Future  Body mass index (BMI) of 32.0-32.9 in adult Assessment & Plan: Patient has gained weight since heart attack despite efforts to follow a restrictive Pritikin diet. Trying to incorporate more vegetables and follow American Heart Association recipes. - Continue efforts with diet and exercise. - Continue cardiac rehab and transition to exercise at senior center post-graduation. - Follow up in 4-5 months for physical exam.  Orders: -     CBC with Differential/Platelet; Future -     Lipid panel; Future -     Hemoglobin A1c; Future   I spent 30 min in the mgmt of this patient  Return in about 4 months (around 12/17/2024) for physical.    Toribio MARLA Slain, MD

## 2024-08-16 NOTE — Patient Instructions (Signed)
 It was nice to see you today,  We addressed the following topics today: - You will need to contact your research study provider  to see if you are allowed to switch from Crestor  to Repatha. Please send me a message to let me know what they say. - I have ordered a sleep study. Someone from the sleep center will call you to schedule it. - I recommend you contact your insurance company [employer]) before the sleep study to ask about your copay and any potential out-of-pocket costs. - Continue with your diet and exercise plan, including cardiac rehab. - We will schedule a follow-up visit in 4-5 months, which will be your annual physical.  Have a great day,  Rolan Slain, MD

## 2024-08-16 NOTE — Assessment & Plan Note (Signed)
 Patient reports excessive daytime somnolence, sleeping 9-12 hours per night plus daytime naps, and poor quality sleep. Husband confirms light snoring. Apple Watch data suggests poor respiratory rate. Thyroid and basic labs done by cardiology were normal. Ferritin has not been checked. The presentation is highly suspicious for obstructive sleep apnea. - Order in-lab sleep study (polysomnography). - Discussed rationale for study is to confirm or rule out sleep apnea as the cause of fatigue, even if patient is hesitant about CPAP therapy. - Discussed alternative treatments for sleep apnea if diagnosed, including oral appliances (mouth guards), Inspire device, and weight loss (e.g., via GLP-1 agonists).

## 2024-08-16 NOTE — Assessment & Plan Note (Signed)
 Patient has gained weight since heart attack despite efforts to follow a restrictive Pritikin diet. Trying to incorporate more vegetables and follow American Heart Association recipes. - Continue efforts with diet and exercise. - Continue cardiac rehab and transition to exercise at senior center post-graduation. - Follow up in 4-5 months for physical exam.

## 2024-08-16 NOTE — Assessment & Plan Note (Signed)
 Patient has LDL at 71 on Crestor  but is experiencing significant, intolerable joint pain. Cardiologist suggested a switch to Repatha. Patient is enrolled in a research trial, and it is unclear if changing medication is permissible. - Patient to contact research trial nurse (Dr. Eston office) to confirm if switching from Crestor  to Repatha is allowed. - If approved, will prescribe Repatha. Plan would be to switch from Crestor  to Repatha and monitor lipid panel. May consider adding back low-dose Crestor  if needed to reach goal and if tolerated. - Patient will send a message to confirm.

## 2024-08-17 ENCOUNTER — Encounter (HOSPITAL_COMMUNITY)
Admission: RE | Admit: 2024-08-17 | Discharge: 2024-08-17 | Disposition: A | Source: Ambulatory Visit | Attending: Cardiology

## 2024-08-17 DIAGNOSIS — I214 Non-ST elevation (NSTEMI) myocardial infarction: Secondary | ICD-10-CM | POA: Diagnosis not present

## 2024-08-17 DIAGNOSIS — Z955 Presence of coronary angioplasty implant and graft: Secondary | ICD-10-CM

## 2024-08-18 ENCOUNTER — Encounter: Payer: Self-pay | Admitting: Family Medicine

## 2024-08-19 ENCOUNTER — Encounter (HOSPITAL_COMMUNITY)
Admission: RE | Admit: 2024-08-19 | Discharge: 2024-08-19 | Disposition: A | Discharge status: Home / Self Care | Source: Ambulatory Visit | Attending: Cardiology | Admitting: Cardiology

## 2024-08-19 DIAGNOSIS — I214 Non-ST elevation (NSTEMI) myocardial infarction: Secondary | ICD-10-CM | POA: Diagnosis not present

## 2024-08-19 DIAGNOSIS — Z955 Presence of coronary angioplasty implant and graft: Secondary | ICD-10-CM

## 2024-08-22 ENCOUNTER — Other Ambulatory Visit: Payer: Self-pay | Admitting: Family Medicine

## 2024-08-22 ENCOUNTER — Encounter (HOSPITAL_COMMUNITY)
Admission: RE | Admit: 2024-08-22 | Discharge: 2024-08-22 | Disposition: A | Source: Ambulatory Visit | Attending: Cardiology

## 2024-08-22 DIAGNOSIS — Z955 Presence of coronary angioplasty implant and graft: Secondary | ICD-10-CM

## 2024-08-22 DIAGNOSIS — R5383 Other fatigue: Secondary | ICD-10-CM

## 2024-08-22 DIAGNOSIS — I214 Non-ST elevation (NSTEMI) myocardial infarction: Secondary | ICD-10-CM | POA: Diagnosis not present

## 2024-08-22 MED ORDER — REPATHA SURECLICK 140 MG/ML ~~LOC~~ SOAJ: 140.0000 mg | SUBCUTANEOUS | 2 refills | Status: DC

## 2024-08-23 ENCOUNTER — Other Ambulatory Visit: Payer: Self-pay | Admitting: Family Medicine

## 2024-08-23 MED ORDER — WEGOVY 0.25 MG/0.5ML ~~LOC~~ SOAJ: 0.2500 mg | SUBCUTANEOUS | 0 refills | Status: DC

## 2024-08-24 ENCOUNTER — Telehealth (HOSPITAL_COMMUNITY): Payer: Self-pay

## 2024-08-24 ENCOUNTER — Encounter (HOSPITAL_COMMUNITY): Admission: RE | Admit: 2024-08-24

## 2024-08-24 NOTE — Telephone Encounter (Signed)
 Patient left message calling out for 10:15 CR class, states she is sick with a cold but expects to be in Friday for her graduation.

## 2024-08-26 ENCOUNTER — Encounter (HOSPITAL_COMMUNITY)
Admission: RE | Admit: 2024-08-26 | Discharge: 2024-08-26 | Disposition: A | Discharge status: Home / Self Care | Source: Ambulatory Visit | Attending: Cardiology | Admitting: Cardiology

## 2024-08-26 ENCOUNTER — Telehealth: Payer: Self-pay

## 2024-08-26 VITALS — BP 110/68 | HR 62 | Ht 62.0 in | Wt 176.4 lb

## 2024-08-26 DIAGNOSIS — I214 Non-ST elevation (NSTEMI) myocardial infarction: Secondary | ICD-10-CM

## 2024-08-26 DIAGNOSIS — Z955 Presence of coronary angioplasty implant and graft: Secondary | ICD-10-CM

## 2024-08-26 NOTE — Telephone Encounter (Signed)
 Ok great I sent her a Mychart message letting her know.

## 2024-08-26 NOTE — Telephone Encounter (Signed)
 Copied from CRM [id number]. Topic: General - Other >> Aug 26, 2024  1:02 PM Zebedee SAUNDERS wrote: Reason for CRM: Pt wants to know is she needs to stop the rosuvastatin  (CRESTOR ) 10 MG tablet or taper off it gradually? Please call pt at [phone].

## 2024-08-26 NOTE — Telephone Encounter (Signed)
 She can stop the atorvastatin  completely once she starts the repatha.

## 2024-08-26 NOTE — Telephone Encounter (Signed)
 I tried reaching out to patient to get alittle more information but she didn't answer.

## 2024-08-26 NOTE — Progress Notes (Signed)
 Discharge Progress Report  Patient Details  Name: Kathy Stephens MRN: [id number] Date of Birth: 1961-01-03 Referring Provider:   Flowsheet Row INTENSIVE CARDIAC REHAB ORIENT from 06/02/2024 in [facility]  Referring Provider Newman Lawrence, MD     Number of Visits: 68  Reason for Discharge:  Patient reached a stable level of exercise. Patient independent in their exercise. Patient has met program and personal goals.  Smoking History:  Social History   Tobacco Use  Smoking Status Every Day   Types: Cigars   Passive exposure: Current  Smokeless Tobacco Never  Tobacco Comments   5 daily    Diagnosis:  05/02/24 NSTEMI (non-ST elevated myocardial infarction) (HCC)  05/02/24 DES LAD  ADL UCSD:   Initial Exercise Prescription:  Initial Exercise Prescription - 06/02/24 1500       Date of Initial Exercise RX and Referring Provider   Date 06/02/24    Referring Provider Newman Lawrence, MD    Expected Discharge Date 08/24/24      NuStep   Level 1    SPM 70    Minutes 15    METs 2      Prescription Details   Frequency (times per week) 3    Duration Progress to 30 minutes of continuous aerobic without signs/symptoms of physical distress      Intensity   THRR 40-80% of Max Heartrate 58-115    Ratings of Perceived Exertion 11-13    Perceived Dyspnea 0-4      Progression   Progression Continue progressive overload as per policy without signs/symptoms or physical distress.      Resistance Training   Training Prescription Yes    Weight 2    Reps 10-15          Discharge Exercise Prescription (Final Exercise Prescription Changes):  Exercise Prescription Changes - 08/26/24 1031       Response to Exercise   Blood Pressure (Admit) 110/68    Blood Pressure (Exit) 120/70    Heart Rate (Admit) 62 bpm    Heart Rate (Exercise) 103 bpm    Heart Rate (Exit) 71 bpm    Rating of Perceived Exertion  (Exercise) 9    Symptoms None    Comments Kathy Stephens completed the cardiac rehab program today.    Duration Progress to 30 minutes of  aerobic without signs/symptoms of physical distress    Intensity THRR unchanged      Progression   Progression Continue to progress workloads to maintain intensity without signs/symptoms of physical distress.    Average METs 2.5      Resistance Training   Training Prescription Yes    Weight 2 lbs    Reps 10-15    Time 5 Minutes      Interval Training   Interval Training No      NuStep   Level 2    SPM 86    Minutes 30    METs 2.5      Home Exercise Plan   Plans to continue exercise at Home (comment)   Walking   Frequency Add 4 additional days to program exercise sessions.    Initial Home Exercises Provided 07/25/24          Functional Capacity:  6 Minute Walk     Row Name 06/02/24 1531 08/19/24 1047       6 Minute Walk   Phase Initial Discharge    Distance 1245 feet 1664 feet  Distance % Change -- 33.65 %    Distance Feet Change -- 419 ft    Walk Time 6 minutes 6 minutes    # of Rest Breaks 0 0    MPH 2.36 3.15    METS 3.14 3.61    RPE 12 10    Perceived Dyspnea  1 1    VO2 Peak 10.98 12.63    Symptoms Yes (comment) Yes (comment)    Comments 6/10 L chronic calf pain. SOB. Both resolved with rest Mild shortness of breath, left leg pain 5/10. Symptoms resolved with rest.    Resting HR 61 bpm 62 bpm    Resting BP 116/72 120/70    Resting Oxygen Saturation  98 % --    Exercise Oxygen Saturation  during 6 min walk 99 % 100 %    Max Ex. HR 99 bpm 73 bpm    Max Ex. BP 152/68 164/80    2 Minute Post BP 124/64 122/82       Psychological, QOL, Others - Outcomes: PHQ 2/9:    08/19/2024    4:36 PM 08/16/2024    9:20 AM 06/02/2024    2:28 PM 05/09/2024    2:19 PM 03/21/2024    3:51 PM  Depression screen PHQ 2/9  Decreased Interest 0 0 0 0 0  Down, Depressed, Hopeless 0 0 0 0 0  PHQ - 2 Score 0 0 0 0 0  Altered sleeping 2 0 2 1  0  Tired, decreased energy 2 0 1 1 0  Change in appetite 0 0 0 0 0  Feeling bad or failure about yourself  0 0 0 0 0  Trouble concentrating 0 0 0 1 0  Moving slowly or fidgety/restless 0 0 0 0 0  Suicidal thoughts 0 0 0 0 0  PHQ-9 Score 4 0 3 3 0  Difficult doing work/chores Somewhat difficult  Not difficult at all      Quality of Life:  Quality of Life - 08/23/24 0900       Quality of Life   Select Quality of Life      Quality of Life Scores   Health/Function Pre 17.17 %    Health/Function Post 26.03 %    Health/Function % Change 51.6 %    Socioeconomic Pre 24.07 %    Socioeconomic Post 26.79 %    Socioeconomic % Change  11.3 %    Psych/Spiritual Pre 19.92 %    Psych/Spiritual Post 29.14 %    Psych/Spiritual % Change 46.29 %    Family Pre 24.4 %    Family Post 30 %    Family % Change 22.95 %    GLOBAL Pre 20.23 %    GLOBAL Post 27.41 %    GLOBAL % Change 35.49 %          Personal Goals: Goals established at orientation with interventions provided to work toward goal.  Personal Goals and Risk Factors at Admission - 06/02/24 1535       Core Components/Risk Factors/Patient Goals on Admission    Weight Management Yes;Obesity;Weight Loss    Intervention Weight Management: Develop a combined nutrition and exercise program designed to reach desired caloric intake, while maintaining appropriate intake of nutrient and fiber, sodium and fats, and appropriate energy expenditure required for the weight goal.;Weight Management: Provide education and appropriate resources to help participant work on and attain dietary goals.;Weight Management/Obesity: Establish reasonable short term and long term weight goals.;Obesity: Provide education and appropriate  resources to help participant work on and attain dietary goals.    Goal Weight: Long Term 168 lb (76.2 kg)   pt goal   Hypertension Yes    Intervention Provide education on lifestyle modifcations including regular physical  activity/exercise, weight management, moderate sodium restriction and increased consumption of fresh fruit, vegetables, and low fat dairy, alcohol moderation, and smoking cessation.;Monitor prescription use compliance.    Expected Outcomes Short Term: Continued assessment and intervention until BP is < 140/2mm HG in hypertensive participants. < 130/3mm HG in hypertensive participants with diabetes, heart failure or chronic kidney disease.;Long Term: Maintenance of blood pressure at goal levels.    Lipids Yes    Intervention Provide education and support for participant on nutrition & aerobic/resistive exercise along with prescribed medications to achieve LDL 70mg , HDL >40mg .    Expected Outcomes Short Term: Participant states understanding of desired cholesterol values and is compliant with medications prescribed. Participant is following exercise prescription and nutrition guidelines.;Long Term: Cholesterol controlled with medications as prescribed, with individualized exercise RX and with personalized nutrition plan. Value goals: LDL < 70mg , HDL > 40 mg.    Stress Yes    Intervention Refer participants experiencing significant psychosocial distress to appropriate mental health specialists for further evaluation and treatment. When possible, include family members and significant others in education/counseling sessions.;Offer individual and/or small group education and counseling on adjustment to heart disease, stress management and health-related lifestyle change. Teach and support self-help strategies.    Expected Outcomes Short Term: Participant demonstrates changes in health-related behavior, relaxation and other stress management skills, ability to obtain effective social support, and compliance with psychotropic medications if prescribed.;Long Term: Emotional wellbeing is indicated by absence of clinically significant psychosocial distress or social isolation.           Personal Goals  Discharge:  Goals and Risk Factor Review     Row Name 06/08/24 1409 07/12/24 1213 08/09/24 0929 08/30/24 1358       Core Components/Risk Factors/Patient Goals Review   Personal Goals Review Weight Management/Obesity;Hypertension;Lipids;Stress Weight Management/Obesity;Hypertension;Lipids;Stress Weight Management/Obesity;Hypertension;Lipids;Stress Weight Management/Obesity;Hypertension;Lipids;Stress    Review Kathy Stephens started cardiac rehab on 06/08/24. Kathy Stephens did well with exercise. Vital signs were stable. Kathy Stephens returned to exercise at  cardiac rehab on 07/06/24 Vital signs have been stable. Kathy Stephens has been doing well with exercise at  cardiac rehab  Vital signs have been stable. Kathy Stephens did well with exercise at  cardiac rehab  Vital signs were stable. Kathy Stephens completed cardiac rehab on 08/26/24.    Expected Outcomes Meta will continue to participate in cardiac rehab for exercise, nutrition and lifestyle modificaitons Kathy Stephens will continue to participate in cardiac rehab for exercise, nutrition and lifestyle modificaitons Kathy Stephens will continue to participate in cardiac rehab for exercise, nutrition and lifestyle modificaitons Kathy Stephens will continue to exercise, follow nutrition and lifestyle modificaitons upon completion of cardiac rehab.       Exercise Goals and Review:  Exercise Goals     Row Name 06/02/24 1429             Exercise Goals   Increase Physical Activity Yes       Intervention Provide advice, education, support and counseling about physical activity/exercise needs.;Develop an individualized exercise prescription for aerobic and resistive training based on initial evaluation findings, risk stratification, comorbidities and participant's personal goals.       Expected Outcomes Short Term: Attend rehab on a regular basis to increase amount of physical activity.;Long Term: Exercising regularly at least 3-5 days a week.;Long Term:  Add in home exercise to make exercise part of routine  and to increase amount of physical activity.       Increase Strength and Stamina Yes       Intervention Provide advice, education, support and counseling about physical activity/exercise needs.;Develop an individualized exercise prescription for aerobic and resistive training based on initial evaluation findings, risk stratification, comorbidities and participant's personal goals.       Expected Outcomes Short Term: Increase workloads from initial exercise prescription for resistance, speed, and METs.;Short Term: Perform resistance training exercises routinely during rehab and add in resistance training at home;Long Term: Improve cardiorespiratory fitness, muscular endurance and strength as measured by increased METs and functional capacity ( )       Able to understand and use rate of perceived exertion (RPE) scale Yes       Intervention Provide education and explanation on how to use RPE scale       Expected Outcomes Short Term: Able to use RPE daily in rehab to express subjective intensity level;Long Term:  Able to use RPE to guide intensity level when exercising independently       Knowledge and understanding of Target Heart Rate Range (THRR) Yes       Intervention Provide education and explanation of THRR including how the numbers were predicted and where they are located for reference       Expected Outcomes Short Term: Able to state/look up THRR;Long Term: Able to use THRR to govern intensity when exercising independently;Short Term: Able to use daily as guideline for intensity in rehab       Understanding of Exercise Prescription Yes       Intervention Provide education, explanation, and written materials on patient's individual exercise prescription       Expected Outcomes Short Term: Able to explain program exercise prescription;Long Term: Able to explain home exercise prescription to exercise independently          Exercise Goals Re-Evaluation:  Exercise Goals Re-Evaluation     Row  Name 06/08/24 1021 07/06/24 1134 07/25/24 1109 08/08/24 1112 08/10/24 1024     Exercise Goal Re-Evaluation   Exercise Goals Review Increase Physical Activity;Increase Strength and Stamina;Able to understand and use rate of perceived exertion (RPE) scale Increase Physical Activity;Increase Strength and Stamina;Able to understand and use rate of perceived exertion (RPE) scale Increase Physical Activity;Increase Strength and Stamina;Able to understand and use rate of perceived exertion (RPE) scale Increase Physical Activity;Increase Strength and Stamina;Able to understand and use rate of perceived exertion (RPE) scale Increase Physical Activity;Increase Strength and Stamina;Able to understand and use rate of perceived exertion (RPE) scale;Able to check pulse independently;Knowledge and understanding of Target Heart Rate Range (THRR);Understanding of Exercise Prescription   Comments Kathy Stephens was able to understand and use RPE scale appropriately. She is awaiting hip surgery. She tolerated the recumbent stepper for 27 minutes without issue. Kathy Stephens returned to exercise today post-hospitialization and tolerated low intensity exercise well. Kathy Stephens will be out of town the next 2 weeks. Kathy Stephens returned to [facility] today after 2-week vacation. She walked 0.5 mile daily while she was away. She is currently walking 10-15 minutes 4-5 times/day for 45 minutes walking her dogs. Exercise is limited by right hip pain, which is bone on bone. She had been scheduled for hip replacement prior to her cardiac event. She previously exercised at [facility] participating in yoga and other exercise. She will look into going back to [place] and doing chair yoga for balance. She has a yoga  mat at home and can look into chair yoga on Youtube. her goal is to lose weight. We discussed increasing duration to help with weight loss goal. Kathy Stephens states she feels better since starting the program. She and her husband have rejoined the [facility] and they have the recumbent stepper that she uses here and a circuit of weights that she plans to use for resistance exercises. She is limited by hip and is in need of a repalcement. Reviewed exercise prescription with Kathy Stephens. She did chair yoga at home yesterday. She is walking and will exercise at the [facility]. She has a smart wacth to monitor her pulse.   Expected Outcomes Progress workloads as tolerated. Will review goals upon return to cardiac rehab. Kathy Stephens will continue daily exericse. She will look into longer bouts to help achieve weight loss goals. Kathy Stephens will resume exercise at the [facility]. Kathy Stephens will continue walking, yoga, and exercise at the gym in addition to exercise at cardiac rehab.    Row Name 08/19/24 1100 08/26/24 1127           Exercise Goal Re-Evaluation   Exercise Goals Review Increase Physical Activity;Increase Strength and Stamina;Able to understand and use rate of perceived exertion (RPE) scale;Able to check pulse independently;Knowledge and understanding of Target Heart Rate Range (THRR);Understanding of Exercise Prescription Increase Physical Activity;Increase Strength and Stamina;Able to understand and use rate of perceived exertion (RPE) scale;Able to check pulse independently;Knowledge and understanding of Target Heart Rate Range (THRR);Understanding of Exercise Prescription      Comments Kathy Stephens will complete cardiac rehab next week, and she plans to continue exercise at [facility] 3 days/week using the recumbent stepper and the weight circuit. She will use exercise bands and cans for her resistance at home. She will do chair yoga online at home on the days she doesn't go to the [employer]. She also plans to exercise at [facility], which is close to her house. Kathy Stephens completed the cardiac rehab and will continue exercise at home and at the gym.      Expected Outcomes Kathy Stephens will continue walking, yoga, and  exercise at the gym in addition to exercise at cardiac rehab. Kathy Stephens will continue walking, yoga, and exercise at the gym to help maintain health and fitness gains.         Nutrition & Weight - Outcomes:  Pre Biometrics - 06/02/24 1428       Pre Biometrics   Waist Circumference 39.5 inches    Hip Circumference 47 inches    Waist to Hip Ratio 0.84 %    Triceps Skinfold 32 mm    % Body Fat 43.3 %    Grip Strength 10 kg    Flexibility --   not done- hip issues   Single Leg Stand 16.56 seconds          Post Biometrics - 08/26/24 1003        Post  Biometrics   Height 5' 2 (1.575 m)    Waist Circumference 39.25 inches    Hip Circumference 47.25 inches    Waist to Hip Ratio 0.83 %    Triceps Skinfold 31 mm    % Body Fat 43.3 %    Grip Strength 10 kg    Flexibility --   not done- hip issues   Single Leg Stand 25 seconds          Nutrition:  Nutrition Therapy & Goals - 06/08/24 1112  Nutrition Therapy   Diet Heart Healthy Diet    Drug/Food Interactions Statins/Certain Fruits      Personal Nutrition Goals   Nutrition Goal Patient to identify strategies for reducing cardiovascular risk by attending the [employer] education and nutrition series weekly.    Personal Goal #2 Patient to improve diet quality by using the plate method as a guide for meal planning to include lean protein/plant protein, fruits, vegetables, whole grains, nonfat dairy as part of a well-balanced diet.    Comments Patient has medical history of HTN, HLD, tobacco use, CAD, history of multiple TIAs, splenic infarct, NSTEMI. LDL is not at goal; she continues crestor . Patient will benefit from participation in intensive cardiac rehab for nutrition education, exercise, and lifestyle modification.      Intervention Plan   Intervention Prescribe, educate and counsel regarding individualized specific dietary modifications aiming towards targeted core components such as weight, hypertension, lipid management,  diabetes, heart failure and other comorbidities.;Nutrition handout(s) given to patient.    Expected Outcomes Short Term Goal: Understand basic principles of dietary content, such as calories, fat, sodium, cholesterol and nutrients.;Long Term Goal: Adherence to prescribed nutrition plan.          Nutrition Discharge:  Nutrition Assessments - 08/23/24 0902       Rate Your Plate Scores   Pre Score 49          Education Questionnaire Score:  Knowledge Questionnaire Score - 08/23/24 0858       Knowledge Questionnaire Score   Pre Score 23/24    Post Score 24/24          Goals reviewed with patient; copy given to patient.Pt graduates from  Intensive/Traditional cardiac rehab program  with completion of  23 exercise and 23 education sessions. Pt maintained good attendance and progressed nicely during her participation in rehab as evidenced by increased MET level. Kathy Stephens increased her distance on her post exercise walk test by 419 feet,  Medication list reconciled. Repeat  PHQ score- 4 .  Pt has made  lifestyle changes and should be commended for her success. Kathy Stephens achieved her goals during cardiac rehab.   Pt plans to continue exercise at the [place] [facility] 3 days a week and participate in  chair yoga. Kathy Stephens says that she is still not sleeping well and will be having a sleep study in the future. We are proud of Kathy Stephens's progress! Hadassah Elpidio Quan RN BSN

## 2024-09-20 ENCOUNTER — Other Ambulatory Visit: Payer: Self-pay | Admitting: Family Medicine

## 2024-09-20 MED ORDER — WEGOVY 0.5 MG/0.5ML ~~LOC~~ SOAJ: 0.5000 mg | SUBCUTANEOUS | 2 refills | Status: AC

## 2024-10-12 ENCOUNTER — Other Ambulatory Visit: Payer: Self-pay | Admitting: Vascular Surgery

## 2024-10-12 DIAGNOSIS — G459 Transient cerebral ischemic attack, unspecified: Secondary | ICD-10-CM

## 2024-10-12 DIAGNOSIS — I6501 Occlusion and stenosis of right vertebral artery: Secondary | ICD-10-CM

## 2024-10-13 ENCOUNTER — Other Ambulatory Visit: Payer: Self-pay | Admitting: Family Medicine

## 2024-10-13 DIAGNOSIS — R400 Somnolence: Secondary | ICD-10-CM

## 2024-11-07 ENCOUNTER — Encounter

## 2024-11-07 DIAGNOSIS — Z006 Encounter for examination for normal comparison and control in clinical research program: Secondary | ICD-10-CM

## 2024-11-07 MED ORDER — STUDY - ARTEMIS - ZILTIVEKIMAB 15 MG/0.5 ML OR PLACEBO SQ INJECTION (PI-CHRISTOPHER): 15.0000 mg | INJECTION | SUBCUTANEOUS | 0 refills | Status: AC

## 2024-11-07 NOTE — Research (Addendum)
" °  ARTEMIS V6 (Month 6 +/-7 days)  Were all eligibility criteria met to continue in study? [x] Yes [] No   Hep B DNA monitoring: [] Yes [x] No    Concomitant meds: [x] Yes [] No  Started taking semaglutide  (wegovy ) Sep 20 2024 for weight management.  Takes 250 mcg B12 and 1000 IU D3 combination supplement daily started taking Sept 30 2025.    VS: [x] Yes [] No Subject resting for >5 mins before VS taken.    Any hospitalizations/Adverse Events/Infections identified: [] Yes [x] No   Echocardiography data: [] Yes [x] No   EQ-5D-5L Assessment Completed? [x] Yes [] No Reason not done: [] Subject Forgot [] Subject too ill [] Subject refused [] Technical failure [] Other If Other, Specify Collection Date: 05/Jan/2026   Central Lab assessments drawn: [x] Yes [] No   Hs-CRP, biochemistry/hematology, PK sampling, immunogenicity assessments    Pregnancy Test Was the sample collected? [] Yes [] No [x] N/A Was the collection date the same as the visit date? [] Yes [] No Specimen Type: [] Serum [] Urine Collection Date: Collection Time: Pregnancy Test Result: [] Positive [] Negative [] Borderline [] Invalid   Study Drug dispensed via [facility]: [x] Yes [] No Subject returned 3 used pens from kit # [id number] and kit # [id number] with boxes. Pens placed in sharps container and boxes returned to pharmacy. Subject took home 4 pens for home doses from kit # [id number] and [phone]. Instructed to return used pens with boxes to next appt. Pt verbalized understanding.    Assess dosing and administration conditions: Yes [x]  No[]  Instructed patient to dose on same day every month to avoid doses less than 28 days apart. DFUs used for reference.    Ensure updated contact person list: [x] Yes [] No   Dosing diary updated: [x] Yes [] No    Current Medications[1]      [1]  Current Outpatient Medications:    acetaminophen  (TYLENOL ) 500 MG tablet, Take 500-1,000 mg by mouth every 6 (six) hours as needed for moderate pain (pain score  4-6)., Disp: , Rfl:    aspirin  EC 81 MG tablet, Take 1 tablet (81 mg total) by mouth daily. Swallow whole., Disp: 30 tablet, Rfl: 12   Evolocumab  (REPATHA  SURECLICK) 140 MG/ML SOAJ, Inject 140 mg into the skin every 14 (fourteen) days., Disp: 2 mL, Rfl: 2   famotidine  (PEPCID ) 20 MG tablet, Take 20 mg by mouth daily., Disp: , Rfl:    lisinopril  (ZESTRIL ) 10 MG tablet, Take 2 tablets (20 mg total) by mouth daily., Disp: 180 tablet, Rfl: 3   nitroGLYCERIN  (NITROSTAT ) 0.4 MG SL tablet, Place 1 tablet (0.4 mg total) under the tongue every 5 (five) minutes as needed for chest pain for up to 3 doses., Disp: 100 tablet, Rfl: 3   semaglutide -weight management (WEGOVY ) 0.5 MG/0.5ML SOAJ SQ injection, Inject 0.5 mg into the skin once a week., Disp: 2 mL, Rfl: 2   Study - ARTEMIS - ziltivekimab 15 mg/0.5 mL or placebo SQ injection (PI-Christopher), Inject 0.5 mLs (15 mg total) into the skin every 28 (twenty-eight) days. For Investigational Use Only. Bring boxes and pens back to research visits., Disp: 1 mL, Rfl: 0   ticagrelor  (BRILINTA ) 90 MG TABS tablet, Take 1 tablet (90 mg total) by mouth 2 (two) times daily., Disp: 180 tablet, Rfl: 3   Study - ARTEMIS - ziltivekimab 15 mg/0.5 mL or placebo SQ injection (PI-Christopher), Inject 0.5 mLs (15 mg total) into the skin every 28 (twenty-eight) days. (Patient not taking: Reported on 08/16/2024), Disp: 1.5 mL, Rfl: 0  "

## 2024-11-09 NOTE — Research (Addendum)
 Are there any labs that are clinically significant?  Yes []  OR No[x]   Is the patient eligible to continue enrollment in the study after screening visit?  Yes [x]   OR No[] 

## 2024-11-15 ENCOUNTER — Ambulatory Visit (HOSPITAL_BASED_OUTPATIENT_CLINIC_OR_DEPARTMENT_OTHER): Attending: Family Medicine | Admitting: Pulmonary Disease

## 2024-11-15 DIAGNOSIS — R400 Somnolence: Secondary | ICD-10-CM

## 2024-11-17 ENCOUNTER — Other Ambulatory Visit: Payer: Self-pay | Admitting: Family Medicine

## 2024-11-19 DIAGNOSIS — R400 Somnolence: Secondary | ICD-10-CM | POA: Diagnosis not present

## 2024-11-19 NOTE — Procedures (Signed)
 [facility] [address] [place], [place] [address] Tel: [phone]   Fax: [phone]  Home Sleep Test Interpretation  Patient Name: Kathy Stephens, Kathy Stephens Date: 11/15/2024  Date of Birth: [birth year] Study Type: HST  Age: 64 year MRN #: [id number]  Sex: Female Interpreting Physician: NEDA HAMMOND, [id number]  Height: 5' 2 Referring Physician: Chandra Toribio POUR, MD  Weight: 176.0 lbs Recording Tech: Silvano Petite RPSGT RST  BMI: 32.4 Scoring Tech: Will Poet RRT RPSGT RST  ESS: 6 Neck Size: 13.75   Indications for Polysomnography The patient is a 64 year old Female who is 5' 2 and weighs 176.0 lbs. Her BMI equals 32.4.  A home sleep apnea test was performed to evaluate for -.  Medication  No Data.   Polysomnogram Data A home sleep test recorded the standard physiologic parameters including EKG, nasal and oral airflow.  Respiratory parameters of chest and abdominal movements were recorded with Respiratory Inductance Plethysmography belts.  Oxygen saturation was recorded by pulse oximetry.   Study Architecture The total recording time of the polysomnogram was 464.5 minutes. The total monitoring time was 465.0 minutes.  Time spent in Supine position was 321.0 minutes.   Respiratory Events The study revealed a presence of 14 obstructive, 3 central, and 1 mixed apnea resulting in an Apnea index of 2.3 events per hour.  There were 11 hypopneas (>=3% desaturation and/or arousal) resulting in an Apnea\Hypopnea Index (AHI >=3% desaturation and/or arousal) of 3.7 events per hour.  There were 8 hypopneas (>=4% desaturation) resulting in an Apnea\Hypopnea Index (AHI >=4% desaturation) of 3.4 events per hour.  There were - Respiratory Effort Related Arousals resulting in a RERA index of - events per hour. The Respiratory Disturbance Index is 3.7 events per hour.  The snore index was - events per hour.  Mean oxygen saturation was 97.6%.  The lowest oxygen  saturation during monitoring time was 90.0%.  Time spent <=88% oxygen saturation was - minutes (-).  Cardiac Summary The average pulse rate was 63.2 bpm.  The minimum pulse rate was 56.0 bpm while the maximum pulse rate was 93.0 bpm.  Cardiac rhythm was normal/abnormal.  Comments: Patient had a home sleep study performed  Diagnosis:  Negative study for significant sleep disordered breathing with an AHI of 3.7, oxygen nadir of 90%.  Recommendations: Current study is consistent with absence of significant sleep disordered breathing. If there remains significant clinical concerns for sleep disordered breathing, an in-lab study may be considered. Avoid alcohol, sedatives and other CNS depressants that may worsen sleep apnea and disrupt normal sleep architecture. Sleep hygiene should be reviewed to assess factors that may improve sleep quality. Weight management and regular exercise should be initiated or continued  Clinical follow-up of symptoms  This study was personally reviewed and electronically signed by: Dr. Hammond Neda Accredited Board Certified in Sleep Medicine Date/Time:  11/19/24      Study Overview  Recording Time: 712.6 min. Monitoring Time: 465.0 min.  Analysis Start:  09:16:50 PM Supine Time: 321.0 min.  Analysis Stop:  05:01:20 AM     Study Summary   Count Index Longest Event Duration  Apneas & Hypopneas: 29 3.7  Apneas: 33.7 sec.     Hypopneas: 96.4 sec.  RERAs: - - - sec.  Desaturations: 18 2.3 38.1 sec.  Snores: - - - sec.    Minimum Oxygen Saturation: 90.0%    Respiratory Summary   Total Duration Supine Non-Supine   Count Index Average Longest Count Index  Count Index  Obstructive Apnea 14 1.8 14.7 20.4 12 2.2 2 0.8   Mixed Apnea 1 0.1 33.7 33.7 1 0.2 - -   Central Apnea 3 0.4 12.2 13.3 3 0.6 - -   Total Apneas 18 2.3 15.3 33.7 16 3.0 2 0.8            Hypopneas 3% 11 1.4 N.A. N.A. 8 1.5 3 1.3   Apneas & Hyp. 3% 29 3.7 N.A. N.A. 24 4.5 5 2.1             Hypopneas 4% 8 1.0 N.A. N.A. 5 0.9 3 1.3  Apneas & Hyp. 4% 26 3.4 N.A. N.A. 21 3.9 5 2.1             RERAs - - - - - - - -  RDI 75 9.7 N.A. N.A. 62 11.6 13 5.4   Oxygen Saturation Summary   Total Supine Non-Supine  Average SpO2 97.6% 97.3% 98.2%  Minimum SpO2 90.0% 92.0% 90.0%   Maximum SpO2 100.0% 100.0% 100.0%   Oxygen Saturation Distribution  Range (%) Time in range (min) Time in range (%)  90.0 - 100.0 464.3 100.0%  80.0 - 90.0 0.1 0.0%  70.0 - 80.0 - -  60.0 - 70.0 - -  50.0 - 60.0 - -  0.0 - 50.0 - -  Time Spent <=88% SpO2  Range (%) Time in range (min) Time in range (%)  0.0 - 88.0 - -  Cardiac Summary   Total Supine Non-Supine  Average Pulse Rate (BPM) 63.2 63.5 62.6  Minimum Pulse Rate (BPM) 56.0 56.0 56.0  Maximum Pulse Rate (BPM) 93.0 93.0 90.0                     Technologist Comments

## 2024-11-20 ENCOUNTER — Encounter: Payer: Self-pay | Admitting: Family Medicine

## 2024-11-21 NOTE — Telephone Encounter (Signed)
 Contacted pt to have her call her insurance and ask why the cost is $1600.00 for her refill.  I spoke with pharmacist and it sounds like there is a plan limit for this dose and they may be why the cost is so much.

## 2024-11-23 ENCOUNTER — Ambulatory Visit (HOSPITAL_COMMUNITY)
Admission: RE | Admit: 2024-11-23 | Discharge: 2024-11-23 | Disposition: A | Discharge status: Home / Self Care | Source: Ambulatory Visit | Attending: Vascular Surgery | Admitting: Vascular Surgery

## 2024-11-23 ENCOUNTER — Ambulatory Visit: Admitting: Physician Assistant

## 2024-11-23 VITALS — BP 131/85 | HR 63 | Temp 97.9°F | Ht 62.0 in | Wt 166.0 lb

## 2024-11-23 DIAGNOSIS — G459 Transient cerebral ischemic attack, unspecified: Secondary | ICD-10-CM | POA: Diagnosis present

## 2024-11-23 DIAGNOSIS — I6501 Occlusion and stenosis of right vertebral artery: Secondary | ICD-10-CM | POA: Diagnosis not present

## 2024-11-23 NOTE — Progress Notes (Signed)
 " Office Note     CC:  follow up Requesting Provider:  Chandra Toribio POUR, MD  HPI: JALEEYA Stephens is a 64 y.o. (02/01/1961) female who presents for surveillance of vertebral artery stenosis.  She experienced dizziness and right upper extremity weakness and was diagnosed with a TIA.  She was last seen in the office in October 2024.  At that time duplex demonstrated vertebral arteries with antegrade flow.  She denies any further dizzy spells.  She will occasionally have vertigo type symptoms when bending over however the symptoms are predictable.  She denies any strokelike symptoms including slurring speech, changes in vision, or one-sided weakness.  She is on aspirin  daily she is also on Brilinta  and Repatha .  Since last office visit she had an MI requiring PCI.   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   TIA (transient ischemic attack) 05/13/2023   Vertebral artery stenosis 05/12/2023    Past Surgical History:  Procedure Laterality Date   CESAREAN SECTION     CHOLECYSTECTOMY  03/19/2012   Procedure: LAPAROSCOPIC CHOLECYSTECTOMY;  Surgeon: Lynwood MALVA Pina, MD;  Location: [facility] OR;  Service: General;  Laterality: N/A;   COLONOSCOPY WITH PROPOFOL  N/A 12/16/2022   Procedure: COLONOSCOPY WITH BIOPSY;  Surgeon: Unk Corinn Skiff, MD;  Location: [facility];  Service: Endoscopy;  Laterality: N/A;   CORONARY STENT INTERVENTION  N/A 05/02/2024   Procedure: CORONARY STENT INTERVENTION;  Surgeon: Anner Alm ORN, MD;  Location: [facility] INVASIVE CV LAB;  Service: Cardiovascular;  Laterality: N/A;   FOOT SURGERY  1980's   LAPAROSCOPY     with cystectomy   LEFT HEART CATH AND CORONARY ANGIOGRAPHY N/A 05/02/2024   Procedure: LEFT HEART CATH AND CORONARY ANGIOGRAPHY;  Surgeon: Anner Alm ORN, MD;  Location: [facility] INVASIVE CV LAB;  Service: Cardiovascular;  Laterality: N/A;   LEFT HEART CATH AND CORONARY ANGIOGRAPHY N/A 06/28/2024   Procedure: LEFT HEART CATH AND CORONARY ANGIOGRAPHY;  Surgeon: Anner Alm ORN, MD;  Location: [facility] INVASIVE CV LAB;  Service: Cardiovascular;  Laterality: N/A;   LEFT OOPHORECTOMY     tendon relief     TUBAL LIGATION      Social History   Socioeconomic History   Marital status: Married    Spouse name: Not on file   Number of children: 1   Years of education: Not on file   Highest education level: 12th grade  Occupational History   Not on file  Tobacco Use   Smoking status: Former    Types: Cigars    Passive exposure: Current   Smokeless tobacco: Never   Tobacco comments:    5 daily  Vaping Use   Vaping status: Never Used  Substance and Sexual Activity   Alcohol use: Yes    Alcohol/week: 0.0 - 1.0 standard drinks of alcohol    Comment: socially   Drug use: No   Sexual activity: Yes    Partners: Male  Birth control/protection: Surgical    Comment: BTL  Other Topics Concern   Not on file  Social History Narrative   Not on file   Social Drivers of Health   Tobacco Use: Medium Risk (11/23/2024)   Patient History    Smoking Tobacco Use: Former    Smokeless Tobacco Use: Never    Passive Exposure: Current  Physicist, Medical Strain: Patient Declined (04/28/2024)   Overall Financial Resource Strain (CARDIA)    Difficulty of Paying Living Expenses: Patient declined  Food Insecurity: No Food Insecurity (05/01/2024)   Epic    Worried About Programme Researcher, Broadcasting/film/video in the Last Year: Never  true    Ran Out of Food in the Last Year: Never true  Transportation Needs: No Transportation Needs (05/01/2024)   Epic    Lack of Transportation (Medical): No    Lack of Transportation (Non-Medical): No  Physical Activity: Insufficiently Active (04/28/2024)   Exercise Vital Sign    Days of Exercise per Week: 3 days    Minutes of Exercise per Session: 20 min  Stress: No Stress Concern Present (04/28/2024)   [employer] of Occupational Health - Occupational Stress Questionnaire    Feeling of Stress: Not at all  Social Connections: Unknown (04/28/2024)   Social Connection and Isolation Panel    Frequency of Communication with Friends and Family: Patient declined    Frequency of Social Gatherings with Friends and Family: Patient declined    Attends Religious Services: Patient declined    Database Administrator or Organizations: Patient declined    Attends Engineer, Structural: Not on file    Marital Status: Married  Recent Concern: Social Connections - Moderately Isolated (03/17/2024)   Social Connection and Isolation Panel    Frequency of Communication with Friends and Family: Once a week    Frequency of Social Gatherings with Friends and Family: Once a week    Attends Religious Services: Never    Database Administrator or Organizations: No    Attends Engineer, Structural: More than 4 times per year    Marital Status: Married  Catering Manager Violence: Not At Risk (05/01/2024)   Epic    Fear of Current or Ex-Partner: No    Emotionally Abused: No    Physically Abused: No    Sexually Abused: No  Depression (PHQ2-9): Low Risk (08/19/2024)   Depression (PHQ2-9)    PHQ-2 Score: 4  Alcohol Screen: Low Risk (08/07/2023)   Alcohol Screen    Last Alcohol Screening Score (AUDIT): 1  Housing: Low Risk (05/01/2024)   Epic    Unable to Pay for Housing in the Last Year: No    Number of Times Moved in the Last Year: 0    Homeless in the Last Year: No  Utilities: Not At  Risk (05/01/2024)   Epic    Threatened with loss of utilities: No  Health Literacy: Not on file    Family History  Problem Relation Age of Onset   Breast cancer Mother 60   Hypertension Mother    Cancer Mother    Stroke Mother    Cancer Maternal Grandfather    Heart disease Maternal Grandfather    Heart disease Paternal Grandmother    Colon cancer Paternal Grandfather    Cancer Paternal Grandfather        testicular   Cancer Father        associated with colon polyps   Heart disease Father    Hyperlipidemia Father  Breast cancer Maternal Aunt     Current Outpatient Medications  Medication Sig Dispense Refill   acetaminophen  (TYLENOL ) 500 MG tablet Take 500-1,000 mg by mouth every 6 (six) hours as needed for moderate pain (pain score 4-6).     aspirin  EC 81 MG tablet Take 1 tablet (81 mg total) by mouth daily. Swallow whole. 30 tablet 12   famotidine  (PEPCID ) 20 MG tablet Take 20 mg by mouth daily.     lisinopril  (ZESTRIL ) 10 MG tablet Take 2 tablets (20 mg total) by mouth daily. 180 tablet 3   nitroGLYCERIN  (NITROSTAT ) 0.4 MG SL tablet Place 1 tablet (0.4 mg total) under the tongue every 5 (five) minutes as needed for chest pain for up to 3 doses. 100 tablet 3   REPATHA  SURECLICK 140 MG/ML SOAJ INJECT 140 MG SUBCUTANEOUSLY EVERY 14 DAYS 2 mL 2   semaglutide -weight management (WEGOVY ) 0.5 MG/0.5ML SOAJ SQ injection Inject 0.5 mg into the skin once a week. 2 mL 2   Study - ARTEMIS - ziltivekimab 15 mg/0.5 mL or placebo SQ injection (PI-Christopher) Inject 0.5 mLs (15 mg total) into the skin every 28 (twenty-eight) days. (Patient not taking: Reported on 08/16/2024) 1.5 mL 0   Study - ARTEMIS - ziltivekimab 15 mg/0.5 mL or placebo SQ injection (PI-Christopher) Inject 0.5 mLs (15 mg total) into the skin every 28 (twenty-eight) days. For Investigational Use Only. Bring boxes and pens back to research visits. 1 mL 0   ticagrelor  (BRILINTA ) 90 MG TABS tablet Take 1 tablet (90 mg total)  by mouth 2 (two) times daily. 180 tablet 3   No current facility-administered medications for this visit.    Allergies[1]   REVIEW OF SYSTEMS:  Negative unless noted in HPI [X]  denotes positive finding, [ ]  denotes negative finding Cardiac  Comments:  Chest pain or chest pressure:    Shortness of breath upon exertion:    Short of breath when lying flat:    Irregular heart rhythm:        Vascular    Pain in calf, thigh, or hip brought on by ambulation:    Pain in feet at night that wakes you up from your sleep:     Blood clot in your veins:    Leg swelling:         Pulmonary    Oxygen at home:    Productive cough:     Wheezing:         Neurologic    Sudden weakness in arms or legs:     Sudden numbness in arms or legs:     Sudden onset of difficulty speaking or slurred speech:    Temporary loss of vision in one eye:     Problems with dizziness:         Gastrointestinal    Blood in stool:     Vomited blood:         Genitourinary    Burning when urinating:     Blood in urine:        Psychiatric    Major depression:         Hematologic    Bleeding problems:    Problems with blood clotting too easily:        Skin    Rashes or ulcers:        Constitutional    Fever or chills:      PHYSICAL EXAMINATION:  Vitals:   11/23/24 0905 11/23/24 0907  BP: 134/84 131/85  Pulse: 63  Temp: 97.9 F (36.6 C)   SpO2: 100%   Weight: 166 lb (75.3 kg)   Height: 5' 2 (1.575 m)     General:  WDWN in NAD; vital signs documented above Gait: Not observed HENT: WNL, normocephalic Pulmonary: normal non-labored breathing Cardiac: regular HR Abdomen: soft, NT, no masses Skin: without rashes Vascular Exam/Pulses: palpable and symmetrical radial pulses Extremities: without ischemic changes, without Gangrene , without cellulitis; without open wounds;  Musculoskeletal: no muscle wasting or atrophy  Neurologic: A&O X 3; CN grossly intact Psychiatric:  The pt has Normal  affect.   Non-Invasive Vascular Imaging:   Carotid duplex with 1 to 39% stenosis of bilateral internal carotid arteries Vertebral arteries are antegrade    ASSESSMENT/PLAN:: 64 y.o. female here for follow up for surveillance of vertebral artery stenosis with history of TIA  Subjectively Ms. Griswold has not had any neurological events since last office visit.  She occasionally has vertigo symptoms however this is always associated with bending over to pick something up.  Carotid duplex demonstrates patent and antegrade vertebral arteries.  Carotid arteries are also well-appearing without any hemodynamically significant stenosis.  From a vascular surgery standpoint she will require 81 mg aspirin  daily.  We will repeat duplex in 2 years.   Donnice Sender, PA-C [facility] [phone]     [1]  Allergies Allergen Reactions   Other Other (See Comments)    Nut Meg: Throat Swelling/Burning    Azithromycin Rash and Other (See Comments)    Tingly in face and lips   "

## 2024-12-12 ENCOUNTER — Other Ambulatory Visit

## 2024-12-19 ENCOUNTER — Encounter: Admitting: Family Medicine

## 2024-12-22 ENCOUNTER — Encounter: Admitting: Family Medicine

## 2025-02-01 ENCOUNTER — Encounter
